# Patient Record
Sex: Female | Born: 1955 | Race: White | Hispanic: No | Marital: Married | Smoking: Former smoker
Health system: Southern US, Community
[De-identification: ages and names within clinical notes are randomized; demographics above are authoritative.]

## PROBLEM LIST (undated history)

## (undated) DIAGNOSIS — R202 Paresthesia of skin: Secondary | ICD-10-CM

## (undated) DIAGNOSIS — J189 Pneumonia, unspecified organism: Secondary | ICD-10-CM

## (undated) DIAGNOSIS — F32A Depression, unspecified: Secondary | ICD-10-CM

## (undated) DIAGNOSIS — F329 Major depressive disorder, single episode, unspecified: Secondary | ICD-10-CM

## (undated) DIAGNOSIS — Z8709 Personal history of other diseases of the respiratory system: Secondary | ICD-10-CM

## (undated) DIAGNOSIS — R011 Cardiac murmur, unspecified: Secondary | ICD-10-CM

## (undated) DIAGNOSIS — F419 Anxiety disorder, unspecified: Secondary | ICD-10-CM

## (undated) DIAGNOSIS — Z87442 Personal history of urinary calculi: Secondary | ICD-10-CM

## (undated) DIAGNOSIS — E785 Hyperlipidemia, unspecified: Secondary | ICD-10-CM

## (undated) DIAGNOSIS — M1990 Unspecified osteoarthritis, unspecified site: Secondary | ICD-10-CM

## (undated) DIAGNOSIS — R51 Headache: Secondary | ICD-10-CM

## (undated) DIAGNOSIS — R200 Anesthesia of skin: Secondary | ICD-10-CM

## (undated) DIAGNOSIS — IMO0002 Reserved for concepts with insufficient information to code with codable children: Secondary | ICD-10-CM

## (undated) DIAGNOSIS — I10 Essential (primary) hypertension: Secondary | ICD-10-CM

## (undated) DIAGNOSIS — G4730 Sleep apnea, unspecified: Secondary | ICD-10-CM

## (undated) DIAGNOSIS — K219 Gastro-esophageal reflux disease without esophagitis: Secondary | ICD-10-CM

## (undated) DIAGNOSIS — R42 Dizziness and giddiness: Secondary | ICD-10-CM

## (undated) HISTORY — PX: BACK SURGERY: SHX140

## (undated) HISTORY — PX: HAND SURGERY: SHX662

## (undated) HISTORY — PX: TOENAIL EXCISION: SHX183

## (undated) HISTORY — PX: COLONOSCOPY: SHX174

## (undated) HISTORY — DX: Morbid (severe) obesity due to excess calories: E66.01

## (undated) HISTORY — DX: Reserved for concepts with insufficient information to code with codable children: IMO0002

## (undated) HISTORY — PX: OTHER SURGICAL HISTORY: SHX169

## (undated) HISTORY — PX: REDUCTION MAMMAPLASTY: SUR839

## (undated) HISTORY — DX: Hyperlipidemia, unspecified: E78.5

---

## 1997-12-05 ENCOUNTER — Other Ambulatory Visit: Admission: RE | Admit: 1997-12-05 | Discharge: 1997-12-05 | Payer: Self-pay | Admitting: Obstetrics and Gynecology

## 1997-12-31 ENCOUNTER — Other Ambulatory Visit: Admission: RE | Admit: 1997-12-31 | Discharge: 1997-12-31 | Payer: Self-pay | Admitting: *Deleted

## 1998-02-04 ENCOUNTER — Other Ambulatory Visit: Admission: RE | Admit: 1998-02-04 | Discharge: 1998-02-04 | Payer: Self-pay | Admitting: *Deleted

## 1998-11-09 ENCOUNTER — Other Ambulatory Visit: Admission: RE | Admit: 1998-11-09 | Discharge: 1998-11-09 | Payer: Self-pay | Admitting: Obstetrics and Gynecology

## 1999-10-11 ENCOUNTER — Other Ambulatory Visit: Admission: RE | Admit: 1999-10-11 | Discharge: 1999-10-11 | Payer: Self-pay | Admitting: Obstetrics and Gynecology

## 1999-10-11 ENCOUNTER — Encounter (INDEPENDENT_AMBULATORY_CARE_PROVIDER_SITE_OTHER): Payer: Self-pay | Admitting: Specialist

## 2000-02-14 ENCOUNTER — Encounter: Admission: RE | Admit: 2000-02-14 | Discharge: 2000-05-14 | Payer: Self-pay | Admitting: Internal Medicine

## 2000-05-29 ENCOUNTER — Other Ambulatory Visit: Admission: RE | Admit: 2000-05-29 | Discharge: 2000-05-29 | Payer: Self-pay | Admitting: Obstetrics and Gynecology

## 2000-05-29 ENCOUNTER — Encounter: Payer: Self-pay | Admitting: Obstetrics and Gynecology

## 2000-05-29 ENCOUNTER — Encounter: Admission: RE | Admit: 2000-05-29 | Discharge: 2000-05-29 | Payer: Self-pay | Admitting: Obstetrics and Gynecology

## 2000-06-02 ENCOUNTER — Encounter: Payer: Self-pay | Admitting: Obstetrics and Gynecology

## 2000-06-05 ENCOUNTER — Encounter (INDEPENDENT_AMBULATORY_CARE_PROVIDER_SITE_OTHER): Payer: Self-pay

## 2000-06-06 ENCOUNTER — Inpatient Hospital Stay (HOSPITAL_COMMUNITY): Admission: EM | Admit: 2000-06-06 | Discharge: 2000-06-07 | Payer: Self-pay | Admitting: Obstetrics and Gynecology

## 2002-08-15 HISTORY — PX: ABDOMINAL HYSTERECTOMY: SHX81

## 2004-07-19 ENCOUNTER — Ambulatory Visit (HOSPITAL_COMMUNITY): Admission: RE | Admit: 2004-07-19 | Discharge: 2004-07-19 | Payer: Self-pay | Admitting: Internal Medicine

## 2005-01-18 ENCOUNTER — Encounter: Admission: RE | Admit: 2005-01-18 | Discharge: 2005-04-18 | Payer: Self-pay | Admitting: Internal Medicine

## 2005-11-10 ENCOUNTER — Ambulatory Visit: Payer: Self-pay

## 2007-05-21 ENCOUNTER — Ambulatory Visit (HOSPITAL_COMMUNITY): Admission: RE | Admit: 2007-05-21 | Discharge: 2007-05-21 | Payer: Self-pay | Admitting: Internal Medicine

## 2008-07-22 ENCOUNTER — Ambulatory Visit (HOSPITAL_COMMUNITY): Admission: RE | Admit: 2008-07-22 | Discharge: 2008-07-22 | Payer: Self-pay | Admitting: Internal Medicine

## 2008-10-08 ENCOUNTER — Encounter: Payer: Self-pay | Admitting: Pulmonary Disease

## 2008-12-02 ENCOUNTER — Ambulatory Visit (HOSPITAL_COMMUNITY): Admission: RE | Admit: 2008-12-02 | Discharge: 2008-12-02 | Payer: Self-pay | Admitting: Internal Medicine

## 2009-02-17 ENCOUNTER — Ambulatory Visit: Payer: Self-pay | Admitting: Pulmonary Disease

## 2009-02-17 DIAGNOSIS — D259 Leiomyoma of uterus, unspecified: Secondary | ICD-10-CM

## 2009-02-17 DIAGNOSIS — I10 Essential (primary) hypertension: Secondary | ICD-10-CM

## 2009-07-07 ENCOUNTER — Other Ambulatory Visit: Admission: RE | Admit: 2009-07-07 | Discharge: 2009-07-07 | Payer: Self-pay | Admitting: Internal Medicine

## 2009-10-16 ENCOUNTER — Ambulatory Visit (HOSPITAL_BASED_OUTPATIENT_CLINIC_OR_DEPARTMENT_OTHER): Admission: RE | Admit: 2009-10-16 | Discharge: 2009-10-16 | Payer: Self-pay | Admitting: Orthopedic Surgery

## 2009-11-18 ENCOUNTER — Encounter: Admission: RE | Admit: 2009-11-18 | Discharge: 2009-11-18 | Payer: Self-pay | Admitting: Orthopedic Surgery

## 2010-06-04 ENCOUNTER — Ambulatory Visit (HOSPITAL_COMMUNITY): Admission: RE | Admit: 2010-06-04 | Discharge: 2010-06-04 | Payer: Self-pay | Admitting: Internal Medicine

## 2010-07-23 ENCOUNTER — Encounter
Admission: RE | Admit: 2010-07-23 | Discharge: 2010-08-10 | Payer: Self-pay | Source: Home / Self Care | Attending: Orthopaedic Surgery | Admitting: Orthopaedic Surgery

## 2010-08-13 ENCOUNTER — Encounter
Admission: RE | Admit: 2010-08-13 | Discharge: 2010-08-13 | Payer: Self-pay | Source: Home / Self Care | Attending: Orthopaedic Surgery | Admitting: Orthopaedic Surgery

## 2010-11-08 LAB — GLUCOSE, CAPILLARY: Glucose-Capillary: 156 mg/dL — ABNORMAL HIGH (ref 70–99)

## 2010-11-08 LAB — BASIC METABOLIC PANEL
BUN: 8 mg/dL (ref 6–23)
Calcium: 9.6 mg/dL (ref 8.4–10.5)
Creatinine, Ser: 0.69 mg/dL (ref 0.4–1.2)
GFR calc Af Amer: >60 mL/min (ref >60)
GFR calc non Af Amer: >60 mL/min (ref >60)

## 2010-11-08 LAB — POCT HEMOGLOBIN-HEMACUE: Hemoglobin: 14.4 g/dL (ref 12.0–15.0)

## 2010-12-31 NOTE — H&P (Signed)
[facility]  Patient:    Sara Cross, Sara Cross                           MRN: [id number] Adm. Date:  06/05/00 Attending:  Nena Jordan A. Cherly Hensen, M.D.                         History and Physical  CHIEF COMPLAINT:  Severe dysmenorrhea, menometrorrhagia.  HISTORY OF PRESENT ILLNESS:  This is a 55 year old, gravida 3, para 1-1-1-2 female, status post a cesarean section x 2, tubal ligation, last menstrual period of May 20, 2000, who is now being admitted for laparoscopic-assisted vaginal hysterectomy, possible total abdominal hysterectomy secondary to severe dysmenorrhea unresponsive to nonsteroidals and menometrorrhagia.  The patient was initially seen in March of 1999 with a complaint of heavy menses with associated low-back pain.  At that time the patient was having ongoing heavy menses for about seven to eight months with associated nausea.  She had reported fifty cents size clots with the first three days of her cycle the heaviest and the forth day back pain started which this increased severity. She would change three super Tampax per hour, soaked.  Her cycles at that time was every 23 to 28 days.  The patient did not have intramenstrual or postcoital bleeding.  She had no history of thyroid disease or uterine fibroids.  The patient had underwent cesarean section x 2 secondary to toxicemia.  The patient underwent an ultrasound showing a retroverted uterus which measured 7.3 x 4.2 x 6.3 A with no obvious signs of fibroids, ovaries that were with a normal endometrial stripe.  She had a TSH done that was normal, prolactin that was also normal.  The patient tried several nonsteroidals without management of her symptoms.  She was placed on Nor-Q.D. given her history of hypertension, but had discontinued the medicine secondary to headache noted since the starting of the progestin tablets.  The patient was also given Darvocet-N 100 in the past without any relief of  her symptoms. Given the nonresponse to several hormonal and non-hormonal management to control this patients symptoms, she now presents for a hysterectomy.  ALLERGIES:  No known drug allergies.  MEDICATIONS: 1. Ziac 10 mg p.o. q.d. 2. Lasix 20 mg three times per day. 3. Potassium supplementation.  PAST MEDICAL HISTORY:  Chronic hypertension since age 89.  Recent diagnosis of adult onset diabetes.  PAST SURGICAL HISTORY:  Cesarean section x 2.  Tubal ligation.  OBSTETRICAL HISTORY:  Cesarean section x 2 secondary to toxicemia.  SAB x 1.  FAMILY HISTORY:  Diabetes, hypertension, no blood dyscrasia or clotting problems.  The patient denied any genital, colon or breast cancer.  REVIEW OF SYSTEMS:  Positive for postnasal drip.  Nausea associated with menses.  Hot flashes for several months.  Other systems are negative except for those noted in genitourinary system.  PHYSICAL EXAMINATION:  GENERAL:  A well-developed, well-nourished, obese white female in no acute distress.  VITAL SIGNS:  Blood pressure 136/80.  SKIN:  Shows no lesions.  HEENT:  Anicteric sclerae, pink conjunctivae.  Oropharynx negative.  HEART:  Regular rate and rhythm without murmur.  LUNGS:  Clear to auscultation.  BREASTS:  Pediculated, soft, nontender.  No palpable mass.  ABDOMEN:  Obese, soft, transverse scar.  No organomegaly.  PELVIC:  Vulva showed no lesions.  Vagina ______ blood in the vault.  Cervix Cervix:  No lesions.  Uterus:  Actually about seven weeks size and mobile. Adnexa:  Nontender.  No palpable mass but limited by body habitus.  LABORATORY DATA:  Pap results pending.  Mammogram negative.  Ultrasound October 08, 1999, uterus 8.0 x 4.9 x 5.7.  Ovaries had showed bilateral small follicles.  IMPRESSION:  Severe dysmenorrhagia, menorrhagia, probably secondary to adenomyosis, chronic hypertension, adult onset diabetes mellitus.  PLAN:  Admission.  LAVH.  Question TAH.  Antibiotics  prophylaxis.  DVT prophylaxis.  Risks and benefits of procedure was explained to patient including infection, bleeding which may require blood transfusion.  If blood transfusion, risks include acute reaction, HIV, risk of one out of 1:100,000, hepatitis risk of 1:300,000, inability to do vaginal hysterectomy, injury to the surround organ structures such as bowel, ureter, bladder, internal scar tissue which may already be present from the previous cesarean section, fistula formation, ovarian preservation with possibly need for surgery in future secondary to ovarian cyst or less likely ovarian cancer.  Continuation of her antihypertensive medicines.  No added salt diet.  Diabetes diet.  All questions answered. DD:  06/05/00 TD:  06/05/00 Job: [id number]

## 2010-12-31 NOTE — Op Note (Signed)
[facility]  Patient:    Sara Cross, Sara Cross                       MRN: [id number] Proc. Date: 06/05/00 Adm. Date:  [id number] Attending:  Maxie Better                           Operative Report  PREOPERATIVE DIAGNOSIS:  Severe dysmenorrhea and menorrhagia.  PROCEDURE:  Examination under anesthesia, laparoscopically assisted vaginal hysterectomy.  POSTOPERATIVE DIAGNOSIS:  Severe dysmenorrhea and menorrhagia.  ANESTHESIA:  General.  SURGEON:  Sheronette A. Cherly Hensen, M.D.  ASSISTANT:  Pershing Cox, M.D.  INDICATIONS:  This is a 55 year old gravida 2, para 2, female with a history of chronic hypertension, _____, cesarean section x 2, and tubal ligation, who has had severe dysmenorrhea and menorrhagia unresponsive to nonsteroidals and progesterone-only tablets, who now presents for definitive management of her symptoms.  The patient is a smoker.  Endometrial biopsy was benign.  Pap was normal.  Ultrasound did no reveal any findings of fibroids.  Risks and benefits of the procedure have been explained to the patient, consent was signed, and the patient was transferred to the operating room.  DESCRIPTION OF PROCEDURE:  Under adequate general anesthesia, the patient was placed in the dorsal lithotomy position using Allen stirrups.  Examination under anesthesia revealed an anteverted/axial uterus with no adnexal mass palpable, mobile.  The patient received antibiotic prophylaxis.   The abdomen, perineum, and vagina were sterilely prepped.  Indwelling Foley catheter was placed sterilely.  The patient was then sterilely draped in the usual fashion. A bivalve speculum had been placed in the vagina.  A tenaculum apparatus was placed for manipulation of the uterus.  Attention was then turned to the abdomen, where an infraumbilical transverse incision was then made and with sharp and blunt dissection, the rectus fascia was grasped, subsequently opened.   The parietal peritoneum was bluntly opened.  A Hasson disposable trocar with sleeve was introduced into the abdomen.  The rectus fascia had been tagged with 0 Vicryl sutures on either side.  The abdomen was insufflated without incident.  A lighted video laparoscope was then introduced into the abdomen through that port.  Panoramic inspection was then performed.  Normal liver edge was noted.  A second site was placed suprapubically along the patients prior Pfannenstiel scar and under direct visualization, a 5 mm port was introduced.  Through that second port, a probe was then used.  The bowels were placed displaced upwardly.  There was noted tenting of the bladder reflection secondary to her prior surgery.  There was evidence of prior surgical separation of both tubes, but they were otherwise normal.  Normal ovaries with the left showing evidence of impending ovulation.  The posterior cul-de-sac was free of any evidence of endometriosis, and there were prominent uterosacral ligaments.  Two additional ports were placed in the lower right and left quadrants under direct visualization.  Small incisions were made at those sites, and a 5 mm trocar with sleeve was introduced.  Using the 5 mm tripolar cautery and with the grasper, the left utero-ovarian ligaments were cauterized in three places, then cut.  The left round ligament was cauterized at several sites near each other in succession and cut.  The same procedure as performed on the contralateral side.  The ureter was looked for bilaterally and was not noted on either side but was clearly away from the  operative areas that had been cauterized.  Once the utero-ovarian ligament on the right was also severed, the round ligaments severed on the right as well, the bladder was taken down with sharp dissection using scissors.  At that point, the irrigator was then placed.  The bladder area was inspected, no evidence of active bleeding noted, and  decision was then made to turn to the vaginal portion of the case.  The Allen stirrups were then placed upwardly.  The instruments were removed from the vagina.  An anterior retractor was then placed as well, as well as a weighted speculum was placed posteriorly.   The cervix was grasped with the [facility] tenaculum, clamped x 2.  Circumferentially, dilute Pitressin was injected.  With sidewall retractors, retraction on the cervix, circumferential incision was made at the junction of the cervix and the vaginal area circumferentially.  Using Mayo scissors, the cervix and the vagina area were separated until the posterior cul-de-sac was identified, cut, and opened transversely.  The surrounding areas anteriorly were undermined and the bladder displaced upwardly.  The uterosacral ligaments were then clamped, cut, and suture ligated with 0 Vicryl bilaterally.  The anterior cul-de-sac was identified after dissection anteriorly and the bladder area opened.  The uterine vessels were then bilaterally clamped, cut, and suture ligated with 0 Vicryl suture.  The cardinal ligaments were then clamped, cut, and suture ligated with 0 Vicryl, and the uterus was then severed from its lateral attachments.  The uterus was then removed.  There was a small 1 cm free-floating mass, possibly a pedunculated fibroid, also noted and sent with the specimen.  Good hemostasis was noted.  The posterior cul-de-sac was then reefed with 0 Vicryl suture along the posterior area to reduce the risk of an enterocele.  This was accomplished by a running stitch from the right to the left uterosacral ligament and then tied in the midline.  The peritoneum otherwise was not closed.  Good hemostasis was continued to be noted.  The bowels were pushed upwardly, and the vagina was closed vertically using interrupted 0 Vicryl suture.  At that point, attention was then turned back to the abdomen.  The abdomen was reinsufflated.  Inspection  of the pelvis showed good hemostasis.  The area was irrigated, suctioned of debris.  The uterosacral ligaments were clearly tied in the midline.  The appendix was noted to be normal.  Under direct visualization, the lower ports were all  removed, the abdomen was deflated, and the infraumbilical port site was inspected.  The port was removed under direct visualization and the rectus fascia was again grasped and identified.  Using a finger, the area was inspected and palpated, and no underlying organ structure had come up within the incision.  The rectus fascia was grasped with two Kochers and closed with interrupted figure-of-eight 0 Vicryl suture.  A small bleeder was able to be cauterized in the subcutaneous area infraumbilically.  The incision was injected with 0.25% Marcaine, and the skin was approximated using 4-0 Vicryl suture.  The other smaller ports were closed with _____ glue.  SPECIMEN:  Uterus and cervix with a small 1 cm mass.  ESTIMATED BLOOD LOSS: 150 cc.  URINE OUTPUT:  About 200 cc clear, yellow urine.  INTRAOPERATIVE FLUID:  2100 cc crystalloid.  COUNTS:  Sponge and instrument counts x 2 were correct.  COMPLICATIONS:  None.  The patient tolerated the procedure well, was transferred to the recovery room in stable condition. DD:  06/05/00 TD:  06/06/00  Job: [id number]

## 2011-01-05 ENCOUNTER — Other Ambulatory Visit: Payer: Self-pay | Admitting: Neurosurgery

## 2011-01-05 DIAGNOSIS — M545 Low back pain: Secondary | ICD-10-CM

## 2011-01-06 ENCOUNTER — Other Ambulatory Visit: Payer: Self-pay | Admitting: Neurosurgery

## 2011-01-06 DIAGNOSIS — M545 Low back pain: Secondary | ICD-10-CM

## 2011-01-11 ENCOUNTER — Other Ambulatory Visit: Payer: Self-pay

## 2011-01-11 ENCOUNTER — Ambulatory Visit
Admission: RE | Admit: 2011-01-11 | Discharge: 2011-01-11 | Disposition: A | Discharge status: Home / Self Care | Payer: 59 | Source: Ambulatory Visit | Attending: Neurosurgery | Admitting: Neurosurgery

## 2011-01-11 ENCOUNTER — Other Ambulatory Visit: Payer: Self-pay | Admitting: Neurosurgery

## 2011-01-11 DIAGNOSIS — R52 Pain, unspecified: Secondary | ICD-10-CM

## 2011-01-11 DIAGNOSIS — M545 Low back pain: Secondary | ICD-10-CM

## 2011-01-26 ENCOUNTER — Ambulatory Visit (HOSPITAL_COMMUNITY)
Admission: RE | Admit: 2011-01-26 | Discharge: 2011-01-26 | Disposition: A | Discharge status: Home / Self Care | Payer: 59 | Source: Ambulatory Visit | Attending: Neurosurgery | Admitting: Neurosurgery

## 2011-01-26 ENCOUNTER — Encounter (HOSPITAL_COMMUNITY)
Admission: RE | Admit: 2011-01-26 | Discharge: 2011-01-26 | Disposition: A | Discharge status: Home / Self Care | Payer: 59 | Source: Ambulatory Visit | Attending: Neurosurgery | Admitting: Neurosurgery

## 2011-01-26 ENCOUNTER — Other Ambulatory Visit (HOSPITAL_COMMUNITY): Payer: Self-pay | Admitting: Neurosurgery

## 2011-01-26 DIAGNOSIS — M48061 Spinal stenosis, lumbar region without neurogenic claudication: Secondary | ICD-10-CM

## 2011-01-26 DIAGNOSIS — Z01812 Encounter for preprocedural laboratory examination: Secondary | ICD-10-CM | POA: Insufficient documentation

## 2011-01-26 DIAGNOSIS — M47817 Spondylosis without myelopathy or radiculopathy, lumbosacral region: Secondary | ICD-10-CM | POA: Insufficient documentation

## 2011-01-26 DIAGNOSIS — Z01818 Encounter for other preprocedural examination: Secondary | ICD-10-CM | POA: Insufficient documentation

## 2011-01-26 DIAGNOSIS — M47816 Spondylosis without myelopathy or radiculopathy, lumbar region: Secondary | ICD-10-CM

## 2011-01-26 LAB — CBC
MCH: 31.7 pg (ref 26.0–34.0)
MCHC: 34.5 g/dL (ref 30.0–36.0)
Platelets: 236 10*3/uL (ref 150–400)
RDW: 13.1 % (ref 11.5–15.5)

## 2011-01-26 LAB — BASIC METABOLIC PANEL
BUN: 14 mg/dL (ref 6–23)
Calcium: 10.3 mg/dL (ref 8.4–10.5)
Creatinine, Ser: 0.59 mg/dL (ref 0.4–1.2)
GFR calc non Af Amer: >60 mL/min (ref >60)
Glucose, Bld: 127 mg/dL — ABNORMAL HIGH (ref 70–99)
Potassium: 4.6 mEq/L (ref 3.5–5.1)

## 2011-01-26 LAB — SURGICAL PCR SCREEN: Staphylococcus aureus: NEGATIVE

## 2011-01-26 LAB — TYPE AND SCREEN: ABO/RH(D): O NEG

## 2011-01-31 ENCOUNTER — Inpatient Hospital Stay (HOSPITAL_COMMUNITY): Payer: 59

## 2011-01-31 ENCOUNTER — Inpatient Hospital Stay (HOSPITAL_COMMUNITY)
Admission: RE | Admit: 2011-01-31 | Discharge: 2011-02-04 | DRG: 460 | Disposition: A | Discharge status: Home / Self Care | Payer: 59 | Source: Ambulatory Visit | Attending: Neurosurgery | Admitting: Neurosurgery

## 2011-01-31 DIAGNOSIS — Z01810 Encounter for preprocedural cardiovascular examination: Secondary | ICD-10-CM

## 2011-01-31 DIAGNOSIS — M5126 Other intervertebral disc displacement, lumbar region: Secondary | ICD-10-CM | POA: Diagnosis present

## 2011-01-31 DIAGNOSIS — Z01812 Encounter for preprocedural laboratory examination: Secondary | ICD-10-CM

## 2011-01-31 DIAGNOSIS — Z01818 Encounter for other preprocedural examination: Secondary | ICD-10-CM

## 2011-01-31 DIAGNOSIS — M47817 Spondylosis without myelopathy or radiculopathy, lumbosacral region: Principal | ICD-10-CM | POA: Diagnosis present

## 2011-02-02 NOTE — Op Note (Signed)
Sara Cross, Sara Cross NO.:  [id number]  MEDICAL RECORD NO.:  [id number]  LOCATION:  3038                         FACILITY:  [facility]  PHYSICIAN:  Donalee Citrin, M.D.        DATE OF BIRTH:  [birth year]  DATE OF PROCEDURE:  01/31/2011 DATE OF DISCHARGE:                              OPERATIVE REPORT   PREOPERATIVE DIAGNOSIS:  Lumbar spinal stenosis multifactorial from spondylosis and large ruptured disk at L2-3 and L3-4.  PROCEDURE:  Decompressive lumbar laminectomies and excessive need with a standard interbody fusion, posterior lumbar interbody fusion at L2-3 and L3-4 using Telamon PEEK cage packed with autograft and Tangent allograft wedge, pedicle screw fixation using the Globus Revere pedicle screw system L2-L4, posterolateral arthrodesis L2-L4 using autograft harvested locally.  SURGEON:  Donalee Citrin, MD.  ANESTHESIA.:  General endotracheal.  HISTORY OF PRESENT ILLNESS:  The patient is a very pleasant 55 year old female who has had progressed worsening back, bilateral leg pain radiating down prominently to anterior quads, but occasionally around the knee the front of her shin consistent both in L3-L4 nerve root pattern.  MRI scan showed severe lumbar spinal stenosis, prominently at L2-L3, but also L3-L4, large disk herniation at L2-L3, severe facet arthropathy of the L2-3 and L3-4.  The patient was recommended decompressive and stabilization procedure after failed conservative treatment, MRI findings and clinical exam, so the risks and benefits of the operation were explained to the patient.  She understood and agreed to proceed forth.  DESCRIPTION OF PROCEDURE:  The patient was brought to the OR and induced under anesthesia, positioned prone on the Wilson frame.  Back was prepped and draped in the routine sterile fashion.  The fascia was localized at the appropriate level.  After infiltration of 10 mL of lidocaine with epi, a midline incision was made.  Bovie  electrocautery was used to take down the subcutaneous and subperiosteal and dissection was carried down the lamina of L2, L3 and L4 bilaterally exposing the T- piece at L2, L3 and L4 bilaterally.  Intraoperative x-ray identified the appropriate level.  The spinous processes at L2 and L3 were removed. Central decompression was begun and marked facet arthropathy was causing severe hourglass compression of thecal sac at both L2-3 and L3-4.  This was all teased away with a Penfield and removed with Kerrison and rongeurs.  Complete medial facetectomies were performed at L2-3 and L3-4 to gain access to lateral margin disk space.  This allowed aggressive unroofing of the foramina at L2, L3 and L4, and all epidural veins coagulated at the disk space and after adequate decompression had been achieved, attention was taken for pedicle placement using a high-speed drill, pilot holes were drilled at L2 in the right cannula with the awl probed, tapped with a 5.5 tap, probed again and 6 x 45 screw inserted at L2 on the right.  In a similar fashion 6 x 40 screws were inserted at L3and L4 on the right as well as L2, L3 and L4 on the left.  After all screws had been placed, attention taken to the interbody work using a D'Errico.  The L3 nerve root was reflected medially and  disk space was incised and cleaned out.  Sequential dilatation was carried out with a size 8, 9, and 10 distractor, 10 was felt to be the appropriate sizing of the graft at L2-3.  Then, the distractor was also inserted to L4-5 and this was sized up to an 8, so with an 10 distractor in L2-3 the opposite side was cleaned out.  Endplates were prepped.  A  Telamon PEEK cage packed with autograft was then inserted on the patient's left side. Then, at L2-3, then at L3-4 in a similar fashion endplates were prepped using a size 8 cutter and chisel, and then adequate central decompression and diskectomy was performed scraping the  central endplates and a Tangent allograft wedge was inserted in left L4-5, then working on the right, large disk herniation was immediately identified and removed from the diskectomy at L2-3.  Several large fiber cyst were removed from the central compartment of both L2-3 and L3-4.  Local autograft was then packed centrally after adequate end-plate preparation with size 10 cutter and chisel and size 8 cutter and chisel respectively at L2-3 and L3-4, and then a Tangent allograft wedge was inserted on the right L2-3 and a Telamon on the right at L3-4.  After all the interbody work had been done, screws had been placed.  Copious irrigation was achieved, aggressive decortication was carried on T-piece and lateral gutters.  The remainder of local autograft was packed posterolaterally. A 75-mm rod was placed on the left and 80-mm rod on the right.  Top tightening nuts were placed at L5-L4.  The L3 screw was compressed against L4.  The L2 compressed against L3.  Cross-link was then applied. Large Hemovac drain was placed.  Postop fluoroscopy confirmed good position of the screws, rods and implant.  Then, the wound was closed sequentially in layers with interrupted Vicryl and skin was closed with running 4-0 subcuticular.  Benzoin and Steri-Strips were applied.  The patient went to recovery room in stable condition.  At the end of the case, sponge and instrument counts were correct.          ______________________________ Donalee Citrin, M.D.     GC/MEDQ  D:  01/31/2011  T:  02/01/2011  Job:  [id number]  Electronically Signed by Donalee Citrin M.D. on 02/02/2011 08:50:50 AM

## 2011-02-03 ENCOUNTER — Inpatient Hospital Stay (HOSPITAL_COMMUNITY): Payer: 59

## 2011-03-03 NOTE — Discharge Summary (Signed)
  Sara Cross, Sara Cross                  ACCOUNT NO.:  [id number]  MEDICAL RECORD NO.:  [id number]  LOCATION:  3038                         FACILITY:  [facility]  PHYSICIAN:  Donalee Citrin, M.D.        DATE OF BIRTH:  29-May-1956  DATE OF ADMISSION:  01/31/2011 DATE OF DISCHARGE:  02/04/2011                              DISCHARGE SUMMARY   ADMISSION DIAGNOSES:  Degenerative joint disease and lumbar spinal stenosis, L2-3 and L3-4.  DISCHARGE DIAGNOSES:  Degenerative joint disease and lumbar spinal stenosis, L2-3 and L3-4.  PROCEDURES DURING HOSPITALIZATION:  Decompressive laminectomies at L2-3 and L3-4 with posterior lumbar interbody spinal fusion, L2-3 and L3-4.  HOSPITAL COURSE:  The patient was admitted as an AMA, went to the operating room, and underwent the aforementioned procedure. Postoperatively, the patient did very well, went to the recovery room and the floor.  On the floor, the patient was convalescing well with significant improvement of her legs.  Was ambulating and voiding with physical therapy.  By hospital day #2, drain was taken out.  She was continued to work with therapy.  She did have a little bit of pain that creeped up but was well controlled.  By postop day #4, the patient was stable to discharge home with scheduled followup in approximately 2 weeks on p.o. pain medications and muscle relaxers.  At the time of discharge, the patient was doing very well.          ______________________________ Donalee Citrin, M.D.     GC/MEDQ  D:  02/10/2011  T:  02/11/2011  Job:  [id number]  Electronically Signed by Donalee Citrin M.D. on 03/03/2011 03:43:25 PM

## 2011-03-31 ENCOUNTER — Other Ambulatory Visit (HOSPITAL_COMMUNITY): Payer: Self-pay | Admitting: Neurosurgery

## 2011-03-31 ENCOUNTER — Ambulatory Visit
Admission: RE | Admit: 2011-03-31 | Discharge: 2011-03-31 | Disposition: A | Discharge status: Home / Self Care | Payer: 59 | Source: Ambulatory Visit | Attending: Neurosurgery | Admitting: Neurosurgery

## 2011-03-31 ENCOUNTER — Other Ambulatory Visit: Payer: Self-pay | Admitting: Neurosurgery

## 2011-03-31 DIAGNOSIS — R200 Anesthesia of skin: Secondary | ICD-10-CM

## 2011-03-31 DIAGNOSIS — M47817 Spondylosis without myelopathy or radiculopathy, lumbosacral region: Secondary | ICD-10-CM

## 2011-03-31 DIAGNOSIS — M545 Low back pain: Secondary | ICD-10-CM

## 2011-03-31 DIAGNOSIS — M5137 Other intervertebral disc degeneration, lumbosacral region: Secondary | ICD-10-CM

## 2011-03-31 DIAGNOSIS — M542 Cervicalgia: Secondary | ICD-10-CM

## 2011-04-06 ENCOUNTER — Ambulatory Visit (HOSPITAL_COMMUNITY)
Admission: RE | Admit: 2011-04-06 | Discharge: 2011-04-06 | Disposition: A | Discharge status: Home / Self Care | Payer: 59 | Source: Ambulatory Visit | Attending: Neurosurgery | Admitting: Neurosurgery

## 2011-04-06 DIAGNOSIS — M79609 Pain in unspecified limb: Secondary | ICD-10-CM | POA: Insufficient documentation

## 2011-04-06 DIAGNOSIS — R9430 Abnormal result of cardiovascular function study, unspecified: Secondary | ICD-10-CM

## 2011-04-06 DIAGNOSIS — R200 Anesthesia of skin: Secondary | ICD-10-CM

## 2011-04-06 DIAGNOSIS — R209 Unspecified disturbances of skin sensation: Secondary | ICD-10-CM | POA: Insufficient documentation

## 2011-04-06 DIAGNOSIS — M5020 Other cervical disc displacement, unspecified cervical region: Secondary | ICD-10-CM | POA: Insufficient documentation

## 2011-04-06 DIAGNOSIS — M4802 Spinal stenosis, cervical region: Secondary | ICD-10-CM | POA: Insufficient documentation

## 2011-04-06 DIAGNOSIS — M542 Cervicalgia: Secondary | ICD-10-CM | POA: Insufficient documentation

## 2011-04-06 DIAGNOSIS — M5090 Cervical disc disorder, unspecified, unspecified cervical region: Secondary | ICD-10-CM | POA: Insufficient documentation

## 2011-04-27 ENCOUNTER — Other Ambulatory Visit: Payer: Self-pay | Admitting: Neurosurgery

## 2011-04-27 DIAGNOSIS — I739 Peripheral vascular disease, unspecified: Secondary | ICD-10-CM

## 2011-04-29 ENCOUNTER — Ambulatory Visit
Admission: RE | Admit: 2011-04-29 | Discharge: 2011-04-29 | Disposition: A | Discharge status: Home / Self Care | Payer: 59 | Source: Ambulatory Visit | Attending: Neurosurgery | Admitting: Neurosurgery

## 2011-04-29 DIAGNOSIS — I739 Peripheral vascular disease, unspecified: Secondary | ICD-10-CM

## 2011-04-29 DIAGNOSIS — R200 Anesthesia of skin: Secondary | ICD-10-CM

## 2011-04-29 MED ORDER — IOHEXOL 350 MG/ML SOLN
150.0000 mL | Freq: Once | INTRAVENOUS | Status: AC | PRN
  Administered 2011-04-29: 150 mL via INTRAVENOUS

## 2011-08-04 ENCOUNTER — Other Ambulatory Visit (HOSPITAL_COMMUNITY): Payer: Self-pay | Admitting: Family Medicine

## 2011-08-04 DIAGNOSIS — Z1231 Encounter for screening mammogram for malignant neoplasm of breast: Secondary | ICD-10-CM

## 2011-09-08 ENCOUNTER — Ambulatory Visit (HOSPITAL_COMMUNITY)
Admission: RE | Admit: 2011-09-08 | Discharge: 2011-09-08 | Disposition: A | Discharge status: Home / Self Care | Payer: 59 | Source: Ambulatory Visit | Attending: Family Medicine | Admitting: Family Medicine

## 2011-09-08 DIAGNOSIS — Z1231 Encounter for screening mammogram for malignant neoplasm of breast: Secondary | ICD-10-CM | POA: Insufficient documentation

## 2011-10-28 ENCOUNTER — Other Ambulatory Visit (HOSPITAL_COMMUNITY): Payer: Self-pay | Admitting: *Deleted

## 2011-10-31 ENCOUNTER — Encounter (HOSPITAL_COMMUNITY)
Admission: RE | Admit: 2011-10-31 | Discharge: 2011-10-31 | Disposition: A | Discharge status: Home / Self Care | Payer: BC Managed Care – PPO | Source: Ambulatory Visit | Attending: Neurosurgery | Admitting: Neurosurgery

## 2011-10-31 ENCOUNTER — Encounter (HOSPITAL_COMMUNITY): Payer: Self-pay

## 2011-10-31 DIAGNOSIS — Z01812 Encounter for preprocedural laboratory examination: Secondary | ICD-10-CM | POA: Insufficient documentation

## 2011-10-31 HISTORY — DX: Sleep apnea, unspecified: G47.30

## 2011-10-31 HISTORY — DX: Essential (primary) hypertension: I10

## 2011-10-31 HISTORY — DX: Depression, unspecified: F32.A

## 2011-10-31 HISTORY — DX: Major depressive disorder, single episode, unspecified: F32.9

## 2011-10-31 HISTORY — DX: Anxiety disorder, unspecified: F41.9

## 2011-10-31 LAB — SURGICAL PCR SCREEN: Staphylococcus aureus: NEGATIVE

## 2011-10-31 LAB — BASIC METABOLIC PANEL
CO2: 28 mEq/L (ref 19–32)
Calcium: 10.6 mg/dL — ABNORMAL HIGH (ref 8.4–10.5)
GFR calc non Af Amer: >90 mL/min (ref >90)
Glucose, Bld: 158 mg/dL — ABNORMAL HIGH (ref 70–99)
Potassium: 4.9 mEq/L (ref 3.5–5.1)
Sodium: 136 mEq/L (ref 135–145)

## 2011-10-31 LAB — CBC
HCT: 40.3 % (ref 36.0–46.0)
Hemoglobin: 13.8 g/dL (ref 12.0–15.0)
MCH: 30.4 pg (ref 26.0–34.0)
MCHC: 34.2 g/dL (ref 30.0–36.0)
MCV: 88.8 fL (ref 78.0–100.0)

## 2011-10-31 NOTE — Pre-Procedure Instructions (Addendum)
20 Sara Cross  10/31/2011   Your procedure is scheduled on:  11/04/11 Friday    Report to [facility] Short Stay Center at 1100 AM.  Call this number if you have problems the morning of surgery: [phone]   Remember:   Do not eat food:After Midnight.  May have clear liquids: up to 4 Hours before arrival.  Clear liquids include soda, tea, black coffee, apple or grape juice, broth.  Take these medicines the morning of surgery with A SIP OF WATER: TYLENOL, WELLBUTRIN     Do not wear jewelry, make-up or nail polish.  Do not wear lotions, powders, or perfumes. You may wear deodorant.  Do not shave 48 hours prior to surgery.  Do not bring valuables to the hospital.  Contacts, dentures or bridgework may not be worn into surgery.  Leave suitcase in the car. After surgery it may be brought to your room.  For patients admitted to the hospital, checkout time is 11:00 AM the day of discharge.   Patients discharged the day of surgery will not be allowed to drive home.  Name and phone number of your driver:   Special Instructions: CHG Shower Use Special Wash: 1/2 bottle night before surgery and 1/2 bottle morning of surgery.   Please read over the following fact sheets that you were given: Pain Booklet, MRSA Information and Surgical Site Infection Prevention

## 2011-11-03 MED ORDER — DEXAMETHASONE SODIUM PHOSPHATE 10 MG/ML IJ SOLN: 10.0000 mg | Freq: Once | INTRAMUSCULAR | Status: DC

## 2011-11-03 MED ORDER — CEFAZOLIN SODIUM-DEXTROSE 2-3 GM-% IV SOLR
2.0000 g | INTRAVENOUS | Status: DC
  Filled 2011-11-03: qty 50

## 2011-11-04 ENCOUNTER — Encounter (HOSPITAL_COMMUNITY): Admission: RE | Payer: Self-pay | Source: Ambulatory Visit

## 2011-11-04 ENCOUNTER — Ambulatory Visit (HOSPITAL_COMMUNITY): Admission: RE | Admit: 2011-11-04 | Payer: BC Managed Care – PPO | Source: Ambulatory Visit | Admitting: Neurosurgery

## 2011-11-04 SURGERY — ANTERIOR CERVICAL DECOMPRESSION/DISCECTOMY FUSION 2 LEVELS
Anesthesia: General

## 2011-12-06 ENCOUNTER — Telehealth: Payer: Self-pay | Admitting: Internal Medicine

## 2011-12-06 NOTE — Telephone Encounter (Signed)
Patient has called [facility] requesting a switch to our Dr.Tabori. Please advise if that is ok and I can call her back to schedule an appointment Thank You  Judeth Cornfield McDaniels [facility] Scheduler

## 2011-12-07 ENCOUNTER — Other Ambulatory Visit: Payer: Self-pay | Admitting: Neurosurgery

## 2011-12-08 ENCOUNTER — Encounter (HOSPITAL_COMMUNITY): Payer: Self-pay | Admitting: Pharmacy Technician

## 2011-12-14 NOTE — Pre-Procedure Instructions (Signed)
20 TEMILOLUWA RECCHIA  12/14/2011   Your procedure is scheduled on:  Dec 23, 2011 @ 1152  Report to [facility] at [phone] AM.  Call this number if you have problems the morning of surgery: [phone]   Remember:   Do not eat food:After Midnight.  May have clear liquids: up to 4 Hours before arrival.  Clear liquids include soda, tea, black coffee, apple or grape juice, broth.  Take these medicines the morning of surgery with A SIP OF WATER: Wellbutrin    Do not wear jewelry, make-up or nail polish.  Do not wear lotions, powders, or perfumes.   Do not shave 48 hours prior to surgery.  Do not bring valuables to the hospital.  Contacts, dentures or bridgework may not be worn into surgery.  Leave suitcase in the car. After surgery it may be brought to your room.  For patients admitted to the hospital, checkout time is 11:00 AM the day of discharge.   Patients discharged the day of surgery will not be allowed to drive home.  Special Instructions: CHG Shower Use Special Wash: 1/2 bottle night before surgery and 1/2 bottle morning of surgery.   Please read over the following fact sheets that you were given: Pain Booklet, Coughing and Deep Breathing, MRSA Information and Surgical Site Infection Prevention

## 2011-12-15 ENCOUNTER — Encounter (HOSPITAL_COMMUNITY)
Admission: RE | Admit: 2011-12-15 | Discharge: 2011-12-15 | Disposition: A | Discharge status: Home / Self Care | Payer: BC Managed Care – PPO | Source: Ambulatory Visit | Attending: Neurosurgery | Admitting: Neurosurgery

## 2011-12-15 ENCOUNTER — Encounter (HOSPITAL_COMMUNITY): Payer: Self-pay

## 2011-12-15 HISTORY — DX: Pneumonia, unspecified organism: J18.9

## 2011-12-15 HISTORY — DX: Unspecified osteoarthritis, unspecified site: M19.90

## 2011-12-15 LAB — BASIC METABOLIC PANEL
CO2: 25 mEq/L (ref 19–32)
Chloride: 99 mEq/L (ref 96–112)
Glucose, Bld: 147 mg/dL — ABNORMAL HIGH (ref 70–99)
Sodium: 134 mEq/L — ABNORMAL LOW (ref 135–145)

## 2011-12-15 LAB — SURGICAL PCR SCREEN
MRSA, PCR: NEGATIVE
Staphylococcus aureus: NEGATIVE

## 2011-12-15 LAB — CBC
Hemoglobin: 13 g/dL (ref 12.0–15.0)
Platelets: 251 10*3/uL (ref 150–400)
RBC: 4.16 MIL/uL (ref 3.87–5.11)
WBC: 10.6 10*3/uL — ABNORMAL HIGH (ref 4.0–10.5)

## 2011-12-15 NOTE — Progress Notes (Signed)
Primary physician - Dr. Tyron Russell - Sara Cross Does not have cardiologist.  EKG Jan 2013- Dr. Alberteen Sam - request records Echo and Stress test > 10 years ago.

## 2011-12-22 MED ORDER — CEFAZOLIN SODIUM-DEXTROSE 2-3 GM-% IV SOLR
2.0000 g | INTRAVENOUS | Status: AC
  Administered 2011-12-23: 2 g via INTRAVENOUS
  Filled 2011-12-22: qty 50

## 2011-12-23 ENCOUNTER — Encounter (HOSPITAL_COMMUNITY): Payer: Self-pay | Admitting: *Deleted

## 2011-12-23 ENCOUNTER — Encounter (HOSPITAL_COMMUNITY): Payer: Self-pay | Admitting: Anesthesiology

## 2011-12-23 ENCOUNTER — Encounter (HOSPITAL_COMMUNITY): Payer: Self-pay | Admitting: Certified Registered Nurse Anesthetist

## 2011-12-23 ENCOUNTER — Ambulatory Visit (HOSPITAL_COMMUNITY): Payer: BC Managed Care – PPO

## 2011-12-23 ENCOUNTER — Ambulatory Visit (HOSPITAL_COMMUNITY)
Admission: RE | Admit: 2011-12-23 | Discharge: 2011-12-24 | Disposition: A | Discharge status: Home / Self Care | Payer: BC Managed Care – PPO | Source: Ambulatory Visit | Attending: Neurosurgery | Admitting: Neurosurgery

## 2011-12-23 ENCOUNTER — Ambulatory Visit (HOSPITAL_COMMUNITY): Payer: BC Managed Care – PPO | Admitting: Anesthesiology

## 2011-12-23 ENCOUNTER — Encounter (HOSPITAL_COMMUNITY)
Admission: RE | Disposition: A | Discharge status: Home / Self Care | Payer: Self-pay | Source: Ambulatory Visit | Attending: Neurosurgery

## 2011-12-23 DIAGNOSIS — Z01812 Encounter for preprocedural laboratory examination: Secondary | ICD-10-CM | POA: Insufficient documentation

## 2011-12-23 DIAGNOSIS — M47817 Spondylosis without myelopathy or radiculopathy, lumbosacral region: Secondary | ICD-10-CM | POA: Insufficient documentation

## 2011-12-23 DIAGNOSIS — G5600 Carpal tunnel syndrome, unspecified upper limb: Secondary | ICD-10-CM | POA: Insufficient documentation

## 2011-12-23 HISTORY — PX: ANTERIOR CERVICAL DECOMP/DISCECTOMY FUSION: SHX1161

## 2011-12-23 HISTORY — PX: CARPAL TUNNEL RELEASE: SHX101

## 2011-12-23 LAB — GLUCOSE, CAPILLARY: Glucose-Capillary: 153 mg/dL — ABNORMAL HIGH (ref 70–99)

## 2011-12-23 SURGERY — ANTERIOR CERVICAL DECOMPRESSION/DISCECTOMY FUSION 2 LEVELS
Anesthesia: General | Site: Neck | Wound class: Clean

## 2011-12-23 MED ORDER — VECURONIUM BROMIDE 10 MG IV SOLR
INTRAVENOUS | Status: DC | PRN
  Administered 2011-12-23 (×3): 1 mg via INTRAVENOUS

## 2011-12-23 MED ORDER — LACTATED RINGERS IV SOLN
INTRAVENOUS | Status: DC | PRN
  Administered 2011-12-23 (×2): via INTRAVENOUS

## 2011-12-23 MED ORDER — METFORMIN HCL ER 500 MG PO TB24
1000.0000 mg | ORAL_TABLET | Freq: Every day | ORAL | Status: DC
  Administered 2011-12-23: 1000 mg via ORAL
  Filled 2011-12-23 (×2): qty 2

## 2011-12-23 MED ORDER — CYCLOBENZAPRINE HCL 10 MG PO TABS
10.0000 mg | ORAL_TABLET | Freq: Three times a day (TID) | ORAL | Status: DC | PRN
  Administered 2011-12-23 – 2011-12-24 (×3): 10 mg via ORAL
  Filled 2011-12-23 (×2): qty 1

## 2011-12-23 MED ORDER — ACETAMINOPHEN 650 MG RE SUPP: 650.0000 mg | RECTAL | Status: DC | PRN

## 2011-12-23 MED ORDER — ONDANSETRON HCL 4 MG/2ML IJ SOLN: 4.0000 mg | Freq: Once | INTRAMUSCULAR | Status: DC | PRN

## 2011-12-23 MED ORDER — BACITRACIN 50000 UNITS IM SOLR
INTRAMUSCULAR | Status: AC
  Filled 2011-12-23: qty 1

## 2011-12-23 MED ORDER — THROMBIN 5000 UNITS EX SOLR
OROMUCOSAL | Status: DC | PRN
  Administered 2011-12-23: 12:00:00 via TOPICAL

## 2011-12-23 MED ORDER — NEOSTIGMINE METHYLSULFATE 1 MG/ML IJ SOLN
INTRAMUSCULAR | Status: DC | PRN
  Administered 2011-12-23: 5 mg via INTRAVENOUS

## 2011-12-23 MED ORDER — VITAMIN D (ERGOCALCIFEROL) 1.25 MG (50000 UNIT) PO CAPS
50000.0000 [IU] | ORAL_CAPSULE | ORAL | Status: DC
  Administered 2011-12-23: 50000 [IU] via ORAL
  Filled 2011-12-23: qty 1

## 2011-12-23 MED ORDER — HYDROCODONE-ACETAMINOPHEN 10-325 MG PO TABS: 1.0000 | ORAL_TABLET | ORAL | Status: DC | PRN

## 2011-12-23 MED ORDER — DEXAMETHASONE SODIUM PHOSPHATE 10 MG/ML IJ SOLN
INTRAMUSCULAR | Status: AC
  Administered 2011-12-23: 10 mg via INTRAVENOUS
  Filled 2011-12-23: qty 1

## 2011-12-23 MED ORDER — SODIUM CHLORIDE 0.9 % IV SOLN: 0.5000 mg/h | Freq: Once | INTRAVENOUS | Status: DC

## 2011-12-23 MED ORDER — HYDROMORPHONE HCL PF 1 MG/ML IJ SOLN: 0.5000 mg | Freq: Once | INTRAMUSCULAR | Status: DC

## 2011-12-23 MED ORDER — LISINOPRIL 20 MG PO TABS
20.0000 mg | ORAL_TABLET | Freq: Every day | ORAL | Status: DC
  Administered 2011-12-23 – 2011-12-24 (×2): 20 mg via ORAL
  Filled 2011-12-23 (×2): qty 1

## 2011-12-23 MED ORDER — PHENYLEPHRINE HCL 10 MG/ML IJ SOLN
INTRAMUSCULAR | Status: DC | PRN
  Administered 2011-12-23 (×5): 40 ug via INTRAVENOUS

## 2011-12-23 MED ORDER — FENTANYL CITRATE 0.05 MG/ML IJ SOLN
INTRAMUSCULAR | Status: DC | PRN
  Administered 2011-12-23: 50 ug via INTRAVENOUS
  Administered 2011-12-23: 150 ug via INTRAVENOUS
  Administered 2011-12-23: 50 ug via INTRAVENOUS

## 2011-12-23 MED ORDER — SODIUM CHLORIDE 0.9 % IR SOLN
Status: DC | PRN
  Administered 2011-12-23: 11:00:00

## 2011-12-23 MED ORDER — HYDROMORPHONE HCL PF 1 MG/ML IJ SOLN
INTRAMUSCULAR | Status: AC
  Filled 2011-12-23: qty 1

## 2011-12-23 MED ORDER — SODIUM CHLORIDE 0.9 % IV SOLN
INTRAVENOUS | Status: AC
  Filled 2011-12-23: qty 500

## 2011-12-23 MED ORDER — GLYCOPYRROLATE 0.2 MG/ML IJ SOLN
INTRAMUSCULAR | Status: DC | PRN
  Administered 2011-12-23: .8 mg via INTRAVENOUS

## 2011-12-23 MED ORDER — THROMBIN 5000 UNITS EX SOLR
CUTANEOUS | Status: DC | PRN
  Administered 2011-12-23 (×2): 5000 [IU] via TOPICAL

## 2011-12-23 MED ORDER — BUPROPION HCL ER (XL) 300 MG PO TB24
300.0000 mg | ORAL_TABLET | Freq: Every day | ORAL | Status: DC
  Administered 2011-12-23 – 2011-12-24 (×2): 300 mg via ORAL
  Filled 2011-12-23 (×2): qty 1

## 2011-12-23 MED ORDER — OXYCODONE-ACETAMINOPHEN 5-325 MG PO TABS
1.0000 | ORAL_TABLET | ORAL | Status: DC | PRN
  Administered 2011-12-23 – 2011-12-24 (×3): 2 via ORAL
  Filled 2011-12-23 (×3): qty 2

## 2011-12-23 MED ORDER — CEFAZOLIN SODIUM 1-5 GM-% IV SOLN
1.0000 g | Freq: Three times a day (TID) | INTRAVENOUS | Status: AC
  Administered 2011-12-23 – 2011-12-24 (×2): 1 g via INTRAVENOUS
  Filled 2011-12-23 (×2): qty 50

## 2011-12-23 MED ORDER — PROPOFOL 10 MG/ML IV EMUL
INTRAVENOUS | Status: DC | PRN
  Administered 2011-12-23: 200 mg via INTRAVENOUS

## 2011-12-23 MED ORDER — ROCURONIUM BROMIDE 100 MG/10ML IV SOLN
INTRAVENOUS | Status: DC | PRN
  Administered 2011-12-23: 50 mg via INTRAVENOUS

## 2011-12-23 MED ORDER — ONDANSETRON HCL 4 MG/2ML IJ SOLN: 4.0000 mg | INTRAMUSCULAR | Status: DC | PRN

## 2011-12-23 MED ORDER — HEMOSTATIC AGENTS (NO CHARGE) OPTIME
TOPICAL | Status: DC | PRN
  Administered 2011-12-23: 1 via TOPICAL

## 2011-12-23 MED ORDER — LIDOCAINE-EPINEPHRINE 1 %-1:100000 IJ SOLN
INTRAMUSCULAR | Status: DC | PRN
  Administered 2011-12-23: 8 mL

## 2011-12-23 MED ORDER — 0.9 % SODIUM CHLORIDE (POUR BTL) OPTIME
TOPICAL | Status: DC | PRN
  Administered 2011-12-23: 1000 mL

## 2011-12-23 MED ORDER — HYDROMORPHONE HCL PF 1 MG/ML IJ SOLN
0.2500 mg | INTRAMUSCULAR | Status: DC | PRN
  Administered 2011-12-23 (×6): 0.5 mg via INTRAVENOUS

## 2011-12-23 MED ORDER — ACETAMINOPHEN 325 MG PO TABS: 650.0000 mg | ORAL_TABLET | ORAL | Status: DC | PRN

## 2011-12-23 MED ORDER — TRIAMTERENE-HCTZ 37.5-25 MG PO TABS
2.0000 | ORAL_TABLET | Freq: Every day | ORAL | Status: DC
  Administered 2011-12-24: 2 via ORAL
  Filled 2011-12-23 (×2): qty 2

## 2011-12-23 MED ORDER — ONDANSETRON HCL 4 MG/2ML IJ SOLN
INTRAMUSCULAR | Status: DC | PRN
  Administered 2011-12-23: 4 mg via INTRAVENOUS

## 2011-12-23 MED ORDER — LIDOCAINE HCL (CARDIAC) 20 MG/ML IV SOLN
INTRAVENOUS | Status: DC | PRN
  Administered 2011-12-23: 60 mg via INTRAVENOUS

## 2011-12-23 MED ORDER — SODIUM CHLORIDE 0.9 % IJ SOLN
3.0000 mL | Freq: Two times a day (BID) | INTRAMUSCULAR | Status: DC
  Administered 2011-12-23: 3 mL via INTRAVENOUS

## 2011-12-23 MED ORDER — ASPIRIN EC 325 MG PO TBEC
325.0000 mg | DELAYED_RELEASE_TABLET | Freq: Every day | ORAL | Status: DC
Start: 2011-12-23 — End: 2011-12-24
  Administered 2011-12-23 – 2011-12-24 (×2): 325 mg via ORAL
  Filled 2011-12-23 (×3): qty 1

## 2011-12-23 MED ORDER — MENTHOL 3 MG MT LOZG: 1.0000 | LOZENGE | OROMUCOSAL | Status: DC | PRN

## 2011-12-23 MED ORDER — DEXAMETHASONE SODIUM PHOSPHATE 10 MG/ML IJ SOLN: 10.0000 mg | INTRAMUSCULAR | Status: DC

## 2011-12-23 MED ORDER — HYDROMORPHONE HCL PF 1 MG/ML IJ SOLN
0.5000 mg | INTRAMUSCULAR | Status: DC | PRN
  Administered 2011-12-23 – 2011-12-24 (×3): 1 mg via INTRAVENOUS
  Filled 2011-12-23 (×3): qty 1

## 2011-12-23 MED ORDER — CYCLOBENZAPRINE HCL 10 MG PO TABS
ORAL_TABLET | ORAL | Status: AC
  Filled 2011-12-23: qty 1

## 2011-12-23 MED ORDER — PHENOL 1.4 % MT LIQD
1.0000 | OROMUCOSAL | Status: DC | PRN
  Administered 2011-12-23: 1 via OROMUCOSAL
  Filled 2011-12-23: qty 177

## 2011-12-23 SURGICAL SUPPLY — 99 items
ADH SKN CLS APL DERMABOND .7 (GAUZE/BANDAGES/DRESSINGS) ×3
APL SKNCLS STERI-STRIP NONHPOA (GAUZE/BANDAGES/DRESSINGS) ×3
BAG DECANTER FOR FLEXI CONT (MISCELLANEOUS) ×4 IMPLANT
BANDAGE ELASTIC 3 VELCRO ST LF (GAUZE/BANDAGES/DRESSINGS) ×4 IMPLANT
BANDAGE ELASTIC 4 VELCRO ST LF (GAUZE/BANDAGES/DRESSINGS) ×2 IMPLANT
BANDAGE GAUZE 4  KLING STR (GAUZE/BANDAGES/DRESSINGS) ×2 IMPLANT
BANDAGE GAUZE ELAST BULKY 4 IN (GAUZE/BANDAGES/DRESSINGS) ×4 IMPLANT
BENZOIN TINCTURE PRP APPL 2/3 (GAUZE/BANDAGES/DRESSINGS) ×4 IMPLANT
BIT DRILL SPINE QC 12 (BIT) ×2 IMPLANT
BLADE SURG 15 STRL LF DISP TIS (BLADE) ×3 IMPLANT
BLADE SURG 15 STRL SS (BLADE) ×4
BRUSH SCRUB EZ PLAIN DRY (MISCELLANEOUS) ×8 IMPLANT
BUR MATCHSTICK NEURO 3.0 LAGG (BURR) ×4 IMPLANT
CANISTER SUCTION 2500CC (MISCELLANEOUS) ×6 IMPLANT
CLOTH BEACON ORANGE TIMEOUT ST (SAFETY) ×8 IMPLANT
CONT SPEC 4OZ CLIKSEAL STRL BL (MISCELLANEOUS) ×4 IMPLANT
CORDS BIPOLAR (ELECTRODE) ×6 IMPLANT
DECANTER SPIKE VIAL GLASS SM (MISCELLANEOUS) ×4 IMPLANT
DERMABOND ADVANCED (GAUZE/BANDAGES/DRESSINGS) ×1
DERMABOND ADVANCED .7 DNX12 (GAUZE/BANDAGES/DRESSINGS) ×1 IMPLANT
DISPOSABLE MIXING BOWL ×2 IMPLANT
DRAPE C-ARM 42X72 X-RAY (DRAPES) ×8 IMPLANT
DRAPE EXTREMITY T 121X128X90 (DRAPE) ×4 IMPLANT
DRAPE LAPAROTOMY 100X72 PEDS (DRAPES) ×4 IMPLANT
DRAPE MICROSCOPE LEICA (MISCELLANEOUS) ×2 IMPLANT
DRAPE MICROSCOPE ZEISS OPMI (DRAPES) ×2 IMPLANT
DRAPE POUCH INSTRU U-SHP 10X18 (DRAPES) ×4 IMPLANT
DRSG ADAPTIC 3X8 NADH LF (GAUZE/BANDAGES/DRESSINGS) ×2 IMPLANT
DRSG EMULSION OIL 3X3 NADH (GAUZE/BANDAGES/DRESSINGS) ×4 IMPLANT
DRSG OPSITE 4X5.5 SM (GAUZE/BANDAGES/DRESSINGS) ×4 IMPLANT
ELECT COATED BLADE 2.86 ST (ELECTRODE) ×4 IMPLANT
ELECT REM PT RETURN 9FT ADLT (ELECTROSURGICAL) ×4
ELECTRODE REM PT RTRN 9FT ADLT (ELECTROSURGICAL) ×3 IMPLANT
GAUZE SPONGE 4X4 16PLY XRAY LF (GAUZE/BANDAGES/DRESSINGS) ×4 IMPLANT
GLOVE BIO SURGEON STRL SZ 6.5 (GLOVE) IMPLANT
GLOVE BIO SURGEON STRL SZ7 (GLOVE) IMPLANT
GLOVE BIO SURGEON STRL SZ7.5 (GLOVE) IMPLANT
GLOVE BIO SURGEON STRL SZ8 (GLOVE) ×8 IMPLANT
GLOVE BIO SURGEON STRL SZ8.5 (GLOVE) IMPLANT
GLOVE BIOGEL M 8.0 STRL (GLOVE) IMPLANT
GLOVE BIOGEL PI IND STRL 8 (GLOVE) ×2 IMPLANT
GLOVE BIOGEL PI INDICATOR 8 (GLOVE) ×2
GLOVE ECLIPSE 6.5 STRL STRAW (GLOVE) IMPLANT
GLOVE ECLIPSE 7.0 STRL STRAW (GLOVE) IMPLANT
GLOVE ECLIPSE 7.5 STRL STRAW (GLOVE) ×6 IMPLANT
GLOVE ECLIPSE 8.0 STRL XLNG CF (GLOVE) IMPLANT
GLOVE ECLIPSE 8.5 STRL (GLOVE) IMPLANT
GLOVE EXAM NITRILE LRG STRL (GLOVE) IMPLANT
GLOVE EXAM NITRILE MD LF STRL (GLOVE) ×4 IMPLANT
GLOVE EXAM NITRILE XL STR (GLOVE) IMPLANT
GLOVE EXAM NITRILE XS STR PU (GLOVE) IMPLANT
GLOVE INDICATOR 6.5 STRL GRN (GLOVE) IMPLANT
GLOVE INDICATOR 7.0 STRL GRN (GLOVE) IMPLANT
GLOVE INDICATOR 7.5 STRL GRN (GLOVE) IMPLANT
GLOVE INDICATOR 8.0 STRL GRN (GLOVE) IMPLANT
GLOVE INDICATOR 8.5 STRL (GLOVE) ×8 IMPLANT
GLOVE OPTIFIT SS 8.0 STRL (GLOVE) IMPLANT
GLOVE SURG SS PI 6.5 STRL IVOR (GLOVE) IMPLANT
GOWN BRE IMP SLV AUR LG STRL (GOWN DISPOSABLE) ×4 IMPLANT
GOWN BRE IMP SLV AUR XL STRL (GOWN DISPOSABLE) ×8 IMPLANT
GOWN STRL REIN 2XL LVL4 (GOWN DISPOSABLE) ×4 IMPLANT
HAND ALUMI LG (SOFTGOODS) ×4 IMPLANT
HEAD HALTER (SOFTGOODS) ×4 IMPLANT
HEMOSTAT POWDER KIT SURGIFOAM (HEMOSTASIS) ×2 IMPLANT
KIT BASIN OR (CUSTOM PROCEDURE TRAY) ×6 IMPLANT
KIT ROOM TURNOVER OR (KITS) ×6 IMPLANT
MARKER SKIN DUAL TIP RULER LAB (MISCELLANEOUS) ×2 IMPLANT
NDL HYPO 25X1 1.5 SAFETY (NEEDLE) IMPLANT
NDL SPNL 20GX3.5 QUINCKE YW (NEEDLE) ×2 IMPLANT
NEEDLE HYPO 25X1 1.5 SAFETY (NEEDLE) IMPLANT
NEEDLE SPNL 20GX3.5 QUINCKE YW (NEEDLE) ×4 IMPLANT
NS IRRIG 1000ML POUR BTL (IV SOLUTION) ×6 IMPLANT
PACK LAMINECTOMY NEURO (CUSTOM PROCEDURE TRAY) ×4 IMPLANT
PACK SURGICAL SETUP 50X90 (CUSTOM PROCEDURE TRAY) ×4 IMPLANT
PAD ARMBOARD 7.5X6 YLW CONV (MISCELLANEOUS) ×16 IMPLANT
PLATE ANT CERV XTEND 2 LV 32 (Plate) ×2 IMPLANT
PUTTY BONE DBX 2.5 MIS (Bone Implant) ×2 IMPLANT
RUBBERBAND STERILE (MISCELLANEOUS) ×8 IMPLANT
SCREW XTD VAR 4.2 SELF TAP 12 (Screw) ×12 IMPLANT
SPACER COLONIAL 7 SZ 9 (Spacer) ×2 IMPLANT
SPACER COLONIAL LGE 8MM 7DEG (Spacer) ×2 IMPLANT
SPONGE GAUZE 4X4 12PLY (GAUZE/BANDAGES/DRESSINGS) ×6 IMPLANT
SPONGE INTESTINAL PEANUT (DISPOSABLE) ×4 IMPLANT
SPONGE SURGIFOAM ABS GEL SZ50 (HEMOSTASIS) ×4 IMPLANT
STOCKINETTE 4X48 STRL (DRAPES) ×4 IMPLANT
STRIP CLOSURE SKIN 1/2X4 (GAUZE/BANDAGES/DRESSINGS) ×4 IMPLANT
SUT ETHILON 3 0 PS 1 (SUTURE) ×6 IMPLANT
SUT VIC AB 3-0 SH 8-18 (SUTURE) ×4 IMPLANT
SUT VICRYL 4-0 PS2 18IN ABS (SUTURE) ×4 IMPLANT
SYR 20ML ECCENTRIC (SYRINGE) ×4 IMPLANT
SYR BULB 3OZ (MISCELLANEOUS) ×4 IMPLANT
SYR CONTROL 10ML LL (SYRINGE) IMPLANT
TAPE CLOTH 4X10 WHT NS (GAUZE/BANDAGES/DRESSINGS) IMPLANT
TOWEL OR 17X24 6PK STRL BLUE (TOWEL DISPOSABLE) ×6 IMPLANT
TOWEL OR 17X26 10 PK STRL BLUE (TOWEL DISPOSABLE) ×8 IMPLANT
TRAP SPECIMEN MUCOUS 40CC (MISCELLANEOUS) ×4 IMPLANT
TUBE CONNECTING 12X1/4 (SUCTIONS) ×2 IMPLANT
UNDERPAD 30X30 INCONTINENT (UNDERPADS AND DIAPERS) ×4 IMPLANT
WATER STERILE IRR 1000ML POUR (IV SOLUTION) ×8 IMPLANT

## 2011-12-23 NOTE — Transfer of Care (Signed)
Immediate Anesthesia Transfer of Care Note  Patient: Sara Cross  Procedure(s) Performed: Procedure(s) (LRB): ANTERIOR CERVICAL DECOMPRESSION/DISCECTOMY FUSION 2 LEVELS (N/A) CARPAL TUNNEL RELEASE (Left)  Patient Location: PACU  Anesthesia Type: General  Level of Consciousness: awake, alert  and oriented  Airway & Oxygen Therapy: Patient Spontanous Breathing and Patient connected to nasal cannula oxygen  Post-op Assessment: Report given to PACU RN, Post -op Vital signs reviewed and stable and Patient moving all extremities X 4  Post vital signs: Reviewed and stable  Complications: No apparent anesthesia complications

## 2011-12-23 NOTE — Anesthesia Procedure Notes (Signed)
Procedure Name: Intubation Date/Time: 12/23/2011 10:55 AM Performed by: Margaree Mackintosh Pre-anesthesia Checklist: Patient identified, Timeout performed, Emergency Drugs available, Suction available and Patient being monitored Patient Re-evaluated:Patient Re-evaluated prior to inductionOxygen Delivery Method: Circle system utilized Preoxygenation: Pre-oxygenation with 100% oxygen Intubation Type: IV induction Ventilation: Mask ventilation without difficulty Laryngoscope Size: Mac and 3 Grade View: Grade I Tube type: Oral Tube size: 7.5 mm Number of attempts: 1 Airway Equipment and Method: Stylet and LTA kit utilized Placement Confirmation: ETT inserted through vocal cords under direct vision,  positive ETCO2 and breath sounds checked- equal and bilateral Secured at: 22 cm Tube secured with: Tape Dental Injury: Teeth and Oropharynx as per pre-operative assessment

## 2011-12-23 NOTE — Anesthesia Preprocedure Evaluation (Addendum)
Anesthesia Evaluation  Patient identified by MRN, date of birth, ID band Patient awake    Reviewed: Allergy & Precautions, H&P , NPO status , Patient's Chart, lab work & pertinent test results  Airway Mallampati: II TM Distance: >3 FB Neck ROM: Full    Dental  (+) Teeth Intact and Dental Advisory Given   Pulmonary sleep apnea and Continuous Positive Airway Pressure Ventilation ,    Pulmonary exam normal       Cardiovascular hypertension, Pt. on medications Rhythm:regular Rate:Normal     Neuro/Psych Depression negative neurological ROS     GI/Hepatic Neg liver ROS,   Endo/Other  Diabetes mellitus-, Well Controlled, Type 2, Oral Hypoglycemic Agents  Renal/GU negative Renal ROS     Musculoskeletal   Abdominal Normal abdominal exam  (+)   Peds  Hematology   Anesthesia Other Findings   Reproductive/Obstetrics                          Anesthesia Physical Anesthesia Plan  ASA: III  Anesthesia Plan: General   Post-op Pain Management:    Induction: Intravenous  Airway Management Planned: Oral ETT  Additional Equipment:   Intra-op Plan:   Post-operative Plan: Extubation in OR  Informed Consent: I have reviewed the patients History and Physical, chart, labs and discussed the procedure including the risks, benefits and alternatives for the proposed anesthesia with the patient or authorized representative who has indicated his/her understanding and acceptance.   Dental advisory given  Plan Discussed with: Anesthesiologist, Surgeon and CRNA  Anesthesia Plan Comments:        Anesthesia Quick Evaluation

## 2011-12-23 NOTE — Op Note (Signed)
Preoperative diagnosis: Cervical spondylosis with stenosis and radiculopathy at C4-5 and C5-6  Postoperative diagnosis: Same  Procedure: Anterior cervical discectomies and fusion at C4-5 C5-6 using globus peek cages packed with locally harvested autograft mixed with DBX m and the globus extend plating system 32 mm with 6-64mm variable angle screws.  Surgeon: Jillyn Hidden Percival Glasheen  Assistant: Shirlean Kelly  Anesthesia: Gen.  EBL: Minimal  History of present illness: Patient is a very pleasant 51 to female presents with neck pain and left arm pain refractory to all forms of conservative treatment. Workup with MRI scan of her neck showed spondylosis stenosis spinal cord compression and left-sided C5-C6 and radiculopathies. Patient preoperatively had weakness in her left tricep and due to her progression of clinical syndrome MRI findings and failure conservative treatment patient was recommended anterior cervical discectomies and fusion with odorous and benefits of the operation with her as well as  Of course and expectations of outcome alternatives to surgery and she understood and agreed to proceed forward.  Operative procedure: Patient brought in the or was induced under general anesthesia positioned supine the neck in slight extension in 5 pounds of traction the right side and it was prepped and draped in routine sterile fashion. Preoperative localizing appropriate level so a curvilinear incision was made just off midline to the interbody the sternomastoid and the superficial layer of the platysma was dissected out and divided longitudinally. The avascular tissue sternomastoid muscle was drilled down to the fascia precautions doesn't Kitners. Progression identify the C5 disc spaces is marked by making an annulotomy with a 15 blade scalpel and then the longus Cole as reflected laterally and self-retaining retractors placed able to space level. Both the space were further incised large a jar sites were bitten  off of a tractor Kerrison punch drape of the upgoing BA curette and this was removed with the rongeurs the step was placed on the suction tubing and the disc spaces were drilled down the posterior annulus facet complex And bone shavings in a mucous trap. C5-6 aggressive abutting both endplates was carried out decompress the central canal under microscopic illumination. Because sac was identified the pia was removed in piecemeal fashion March across laterally there was extensive amount of spur coming off the posterior aspect C5 vertebral body causing stenosis and compression spinal cord left this is all aggressively under been decompress the central canal March across laterally in both C6 nerve rim and were unroofed decompressed. Discectomies at the stenosis either centrally or foraminally is intact Gelfoam to second C4-5 to similar fashion C4-5 was drilled down aggressive abutting both endplates a decompressive canal at C4-5 there is a marked and hypertrophy displacing both C5 nerve roots is all grossly under been skeletonizing both C5 nerve roots flush with pedicle. At the discectomy here also central canal and foramina were all decompressed the endplates and scraped and 8 mm globus cage packed with local autograft mixed DBX was packed at C4-5 and a 9 mm C56. Then the globus extend plate was placed a Kirschner plate was sized appropriately all 6 screws were placed excellent purchase locking mechanism engaged with to see her get meticulous in space was maintained was closed in layers with after Vicryl and platysma and a running 4 subcuticular or and skin. At the end of case on it counts sponge counts were correct the nurses.

## 2011-12-23 NOTE — H&P (Signed)
Sara Cross is an 56 y.o. female.   Chief Complaint: Neck and left arm pain HPI: Patient is a very pleasant 56 year old female has had long-standing neck and predominantly left arm pain rating down through shoulder in the forearm occasionally into her thumb and first finger she also some numbness and tingling of her sutures her left hand. This refractory to physical therapy anti-inflammatories and time she is to weakness and left upper cavity and an MRI scan that shows cervical spondylosis with stenosis and spinal cord compression and foraminal stenosis at C4-5 and C5-6. In addition she underwent an EMG nerve conduction test which showed left sided carpal tunnel syndrome with axonal changes of median nerve entrapment at the wrist. Spacer was recommended a combined anterior cervical discectomy fusion C4-5 C5-6 as well as left carpal tunnel release. Risks and benefits of the operation as well as The course expectations of outcome of surgery were explained to the patient and she agreed to proceed forward.  Past Medical History  Diagnosis Date  . Hypertension   . Depression   . Pneumonia     hx of  . Sleep apnea     wear cpap  . Diabetes mellitus     takes metformin daily  . Arthritis     Past Surgical History  Procedure Date  . Back surgery     01/2011  . Hand surgery     right   2011    FOR INJURY   . Cesearean     X2  . Toenail excision     BIG TOE RIGHT FOOT  . Toe nail removal     right great toe  . Abdominal hysterectomy 2004  . Colonoscopy     Family History  Problem Relation Age of Onset  . Anesthesia problems Neg Hx   . Hypotension Neg Hx   . Malignant hyperthermia Neg Hx   . Pseudochol deficiency Neg Hx    Social History:  reports that she has been smoking.  She does not have any smokeless tobacco history on file. She reports that she drinks alcohol. She reports that she does not use illicit drugs.  Allergies: No Known Allergies  Medications Prior to Admission    Medication Sig Dispense Refill  . acetaminophen (TYLENOL) 500 MG tablet Take 1,000 mg by mouth every 6 (six) hours as needed. For headache      . aspirin EC 325 MG tablet Take 325 mg by mouth daily.      Marland Kitchen buPROPion (WELLBUTRIN XL) 300 MG 24 hr tablet Take 300 mg by mouth daily.      Marland Kitchen HYDROcodone-acetaminophen (NORCO) 10-325 MG per tablet Take 1 tablet by mouth every 4 (four) hours as needed. pain      . ibuprofen (ADVIL,MOTRIN) 200 MG tablet Take 400 mg by mouth every 6 (six) hours as needed. For pain/headache      . lisinopril (PRINIVIL,ZESTRIL) 20 MG tablet Take 20 mg by mouth daily.      . metFORMIN (GLUCOPHAGE-XR) 500 MG 24 hr tablet Take 1,000 mg by mouth at bedtime.      . triamterene-hydrochlorothiazide (MAXZIDE-25) 37.5-25 MG per tablet Take 2 tablets by mouth daily.      . Vitamin D, Ergocalciferol, (DRISDOL) 50000 UNITS CAPS Take 50,000 Units by mouth 3 (three) times a week. Takes on mondays, wednesdays, and fridays        Results for orders placed during the hospital encounter of 12/23/11 (from the past 48 hour(s))  GLUCOSE, CAPILLARY  Status: Abnormal   Collection Time   12/23/11 10:03 AM      Component Value Range Comment   Glucose-Capillary 153 (*) 70 - 99 (mg/dL)    No results found.  ROS  Blood pressure 120/77, pulse 87, temperature 98 F (36.7 C), temperature source Oral, resp. rate 20, SpO2 95.00%. Patient is awake alert oriented strength is 5 out of 5 in deltoids also 5 out of 5 biceps triceps wrist flexion extension and intrinsics on the right. Left upper extremity and some weakness in the left triceps 4+ out of 5 otherwise 5 out 5. Lower extremities are 5 out of 5 diffusely. Physical Exam   Assessment/Plan 56 year old female who presents for anterior cervical discectomy and fusion at C4-5 and C5-6 and left carpal tunnel release.  Kaleea Penner P 12/23/2011, 10:37 AM

## 2011-12-23 NOTE — Op Note (Signed)
Preoperative diagnosis: left carpal tunnel syndrome  Postoperative diagnosis: Same  Procedure: Left carpal tunnel release  Surgeon: Jillyn Hidden Nicoles Sedlacek  Anesthesia: Gen.  EBL: Minimal  History of present illness: Patient is a 56 year old female whose had pain numbness tingling of her sitting as of her left hand is been refractory to all forms of conservative treatment. EMG documented severe median nerve entrapment at the wrist and due to the EMG findings failure conservative treatment and progression of clinical syndrome patient was recommended left carpal tunnel release.  Operative procedure: After completion of her ACDF the left hand was prepped and draped in routine sterile fashion. Anesthesia Christiane Ha the distal crease the wrist along the palmar crease to the middle finger proximal L4 centers along. After infiltration to 8 cc lidocaine with epi and incision was made and the subcutaneous tissues dissected free down to the flexor retinaculum and transverse carpal ligament. The ligament was incised sequentially in layers until the epidermis the median nerve is identified. It was noted be markedly hypertrophied and this was divided both proximally and distally until hemostat easily passed along the carpal tunnel without resistance. The wounds and to proceed her get meticulous in a stasis was maintained the skin was reapproximated with interrupted vertical mattress and the hand was draped and patient recovered in stable condition at the end of case all needle counts and sponge counts were correct.

## 2011-12-23 NOTE — Preoperative (Signed)
Beta Blockers   Reason not to administer Beta Blockers:Not Applicable 

## 2011-12-24 MED ORDER — CYCLOBENZAPRINE HCL 10 MG PO TABS: 10.0000 mg | ORAL_TABLET | Freq: Three times a day (TID) | ORAL | Status: AC | PRN

## 2011-12-24 MED ORDER — OXYCODONE-ACETAMINOPHEN 5-325 MG PO TABS: 1.0000 | ORAL_TABLET | ORAL | Status: AC | PRN

## 2011-12-24 NOTE — Discharge Summary (Signed)
Physician Discharge Summary  Patient ID: Sara Cross MRN: [id number] DOB/AGE: 20-Feb-1956 56 y.o.  Admit date: 12/23/2011 Discharge date: 12/24/2011  Admission Diagnoses:Cervical spondylosis with stenosis and radiculopathy at C4-5 and C5-6 left carpal tunnel syndrome   Discharge Diagnoses: :Cervical spondylosis with stenosis and radiculopathy at C4-5 and C5-6 left carpal tunnel syndrome  Active Problems:  * No active hospital problems. *    Discharged Condition: good  Hospital Course: pt admitted day of surgery - underwent procedures below - pt doing well, less arm sx's - swallowing well, voiding,   Consults: None  Significant Diagnostic Studies: none  Treatments: surgery: Left carpal tunnel release, Anterior cervical discectomies and fusion at C4-5 C5-6 using globus peek cages packed with locally harvested autograft mixed with DBX m and the globus extend plating system 32 mm with 6-11mm variable angle screws.       Discharge Exam: Blood pressure 104/65, pulse 72, temperature 98 F (36.7 C), temperature source Oral, resp. rate 16, SpO2 95.00%. Wound:c/d/i  Disposition: home   Medication List  As of 12/24/2011  7:58 AM   STOP taking these medications         ibuprofen 200 MG tablet         TAKE these medications         acetaminophen 500 MG tablet   Commonly known as: TYLENOL   Take 1,000 mg by mouth every 6 (six) hours as needed. For headache      aspirin EC 325 MG tablet   Take 325 mg by mouth daily.      buPROPion 300 MG 24 hr tablet   Commonly known as: WELLBUTRIN XL   Take 300 mg by mouth daily.      cyclobenzaprine 10 MG tablet   Commonly known as: FLEXERIL   Take 1 tablet (10 mg total) by mouth 3 (three) times daily as needed for muscle spasms.      HYDROcodone-acetaminophen 10-325 MG per tablet   Commonly known as: NORCO   Take 1 tablet by mouth every 4 (four) hours as needed. pain      lisinopril 20 MG tablet   Commonly known as:  PRINIVIL,ZESTRIL   Take 20 mg by mouth daily.      metFORMIN 500 MG 24 hr tablet   Commonly known as: GLUCOPHAGE-XR   Take 1,000 mg by mouth at bedtime.      oxyCODONE-acetaminophen 5-325 MG per tablet   Commonly known as: PERCOCET   Take 1-2 tablets by mouth every 4 (four) hours as needed.      triamterene-hydrochlorothiazide 37.5-25 MG per tablet   Commonly known as: MAXZIDE-25   Take 2 tablets by mouth daily.      Vitamin D (Ergocalciferol) 50000 UNITS Caps   Commonly known as: DRISDOL   Take 50,000 Units by mouth 3 (three) times a week. Takes on mondays, wednesdays, and fridays             Signed: Clydene Fake, MD 12/24/2011, 7:58 AM

## 2011-12-24 NOTE — Discharge Instructions (Signed)
Wound Care Keep incision covered and dry for 2 days.  If you shower prior to then, cover incision with plastic wrap.  You may remove outer bandage.  Do not put any creams, lotions, or ointments on incision. Leave steri-strips on neck.  They will fall off by themselves. Activity Walk each and every day, increasing distance each day. No lifting greater than 5 lbs.  Avoid excessive neck motion. No driving for 2 weeks; may ride as a passenger locally. Wear neck brace at all times except when showering or otherwise instructed. Diet Resume your normal diet.  Return to Work Will be discussed at you follow up appointment. Call Your Doctor If Any of These Occur Redness, drainage, or swelling at the wound.  Temperature greater than 101 degrees. Severe pain not relieved by pain medication. Increased difficulty swallowing.  Incision starts to come apart. Follow Up Appt Call today for appointment in 1-2 weeks [phone]) or for problems.  If you have any hardware placed in your spine, you will need an x-ray before your appointment.

## 2011-12-26 ENCOUNTER — Encounter (HOSPITAL_COMMUNITY): Payer: Self-pay | Admitting: Neurosurgery

## 2011-12-26 LAB — GLUCOSE, CAPILLARY
Glucose-Capillary: 178 mg/dL — ABNORMAL HIGH (ref 70–99)
Glucose-Capillary: 132 mg/dL — ABNORMAL HIGH (ref 70–99)

## 2012-01-10 NOTE — Anesthesia Postprocedure Evaluation (Signed)
  Anesthesia Post-op Note  Patient: Sara Cross  Procedure(s) Performed: Procedure(s) (LRB): ANTERIOR CERVICAL DECOMPRESSION/DISCECTOMY FUSION 2 LEVELS (N/A) CARPAL TUNNEL RELEASE (Left)  Patient Location: PACU  Anesthesia Type: General  Level of Consciousness: awake, alert , oriented and patient cooperative  Airway and Oxygen Therapy: Patient Spontanous Breathing and Patient connected to nasal cannula oxygen  Post-op Pain: mild  Post-op Assessment: Post-op Vital signs reviewed, Patient's Cardiovascular Status Stable, Respiratory Function Stable, Patent Airway, No signs of Nausea or vomiting and Pain level controlled  Post-op Vital Signs: stable  Complications: No apparent anesthesia complications

## 2012-02-01 ENCOUNTER — Ambulatory Visit
Admission: RE | Admit: 2012-02-01 | Discharge: 2012-02-01 | Disposition: A | Discharge status: Home / Self Care | Payer: BC Managed Care – PPO | Source: Ambulatory Visit | Attending: Neurosurgery | Admitting: Neurosurgery

## 2012-02-01 DIAGNOSIS — M545 Low back pain: Secondary | ICD-10-CM

## 2012-02-17 ENCOUNTER — Ambulatory Visit: Payer: BC Managed Care – PPO | Attending: Neurosurgery | Admitting: Rehabilitation

## 2012-02-17 DIAGNOSIS — IMO0001 Reserved for inherently not codable concepts without codable children: Secondary | ICD-10-CM | POA: Insufficient documentation

## 2012-02-17 DIAGNOSIS — M545 Low back pain, unspecified: Secondary | ICD-10-CM | POA: Insufficient documentation

## 2012-02-17 DIAGNOSIS — M25559 Pain in unspecified hip: Secondary | ICD-10-CM | POA: Insufficient documentation

## 2012-02-20 ENCOUNTER — Ambulatory Visit: Payer: BC Managed Care – PPO | Admitting: Physical Therapy

## 2012-02-22 ENCOUNTER — Ambulatory Visit: Payer: BC Managed Care – PPO | Admitting: Physical Therapy

## 2012-02-24 ENCOUNTER — Ambulatory Visit: Payer: BC Managed Care – PPO | Admitting: Physical Therapy

## 2012-02-27 ENCOUNTER — Ambulatory Visit: Payer: BC Managed Care – PPO | Admitting: Physical Therapy

## 2012-02-29 ENCOUNTER — Ambulatory Visit: Payer: BC Managed Care – PPO | Admitting: Physical Therapy

## 2012-03-05 ENCOUNTER — Ambulatory Visit: Payer: BC Managed Care – PPO | Admitting: Physical Therapy

## 2012-03-07 ENCOUNTER — Ambulatory Visit: Payer: BC Managed Care – PPO | Admitting: Physical Therapy

## 2012-03-12 ENCOUNTER — Ambulatory Visit: Payer: BC Managed Care – PPO | Admitting: Rehabilitation

## 2012-03-14 ENCOUNTER — Ambulatory Visit: Payer: BC Managed Care – PPO | Admitting: Rehabilitation

## 2012-03-19 ENCOUNTER — Ambulatory Visit: Payer: BC Managed Care – PPO | Attending: Neurosurgery | Admitting: Physical Therapy

## 2012-03-19 DIAGNOSIS — IMO0001 Reserved for inherently not codable concepts without codable children: Secondary | ICD-10-CM | POA: Insufficient documentation

## 2012-03-19 DIAGNOSIS — M545 Low back pain, unspecified: Secondary | ICD-10-CM | POA: Insufficient documentation

## 2012-03-19 DIAGNOSIS — M25559 Pain in unspecified hip: Secondary | ICD-10-CM | POA: Insufficient documentation

## 2012-03-21 ENCOUNTER — Ambulatory Visit: Payer: BC Managed Care – PPO | Admitting: Physical Therapy

## 2012-03-26 ENCOUNTER — Ambulatory Visit: Payer: BC Managed Care – PPO | Admitting: Physical Therapy

## 2012-03-28 ENCOUNTER — Ambulatory Visit: Payer: BC Managed Care – PPO | Admitting: Physical Therapy

## 2012-04-26 ENCOUNTER — Encounter (INDEPENDENT_AMBULATORY_CARE_PROVIDER_SITE_OTHER): Payer: Self-pay | Admitting: Surgery

## 2012-04-26 ENCOUNTER — Ambulatory Visit (INDEPENDENT_AMBULATORY_CARE_PROVIDER_SITE_OTHER): Payer: BC Managed Care – PPO | Admitting: Surgery

## 2012-04-26 VITALS — BP 134/88 | HR 84 | Temp 97.9°F | Resp 16 | Ht 62.0 in | Wt 223.2 lb

## 2012-04-26 DIAGNOSIS — E119 Type 2 diabetes mellitus without complications: Secondary | ICD-10-CM

## 2012-04-26 DIAGNOSIS — M129 Arthropathy, unspecified: Secondary | ICD-10-CM

## 2012-04-26 DIAGNOSIS — Z6841 Body Mass Index (BMI) 40.0 and over, adult: Secondary | ICD-10-CM

## 2012-04-26 DIAGNOSIS — M1990 Unspecified osteoarthritis, unspecified site: Secondary | ICD-10-CM

## 2012-04-26 DIAGNOSIS — E785 Hyperlipidemia, unspecified: Secondary | ICD-10-CM

## 2012-04-26 DIAGNOSIS — E139 Other specified diabetes mellitus without complications: Secondary | ICD-10-CM

## 2012-04-26 LAB — CBC WITH DIFFERENTIAL/PLATELET
Basophils Relative: 0 % (ref 0–1)
Eosinophils Absolute: 0.4 10*3/uL (ref 0.0–0.7)
Lymphs Abs: 1.8 10*3/uL (ref 0.7–4.0)
MCH: 30 pg (ref 26.0–34.0)
MCHC: 34.3 g/dL (ref 30.0–36.0)
Neutrophils Relative %: 65 % (ref 43–77)
Platelets: 277 10*3/uL (ref 150–400)
RBC: 4.14 MIL/uL (ref 3.87–5.11)

## 2012-04-26 NOTE — Progress Notes (Signed)
Chief Complaint:  Morbid obesity and DM  History of Present Illness:  Sara Cross is an 56 y.o. female who has attended our seminar and comes in requesting lapband surgery.  She has had a good response from 30 pound weight loss with loss of her need for metformin. I indicated that a bypass may be a better operation for more resistant diabetes mellitus but I thought she might well responded very nicely to a LAP-BAND.  She's tried multiple weight loss efforts including the weight loss requiring B12 shots again losing 30-40 pounds.  Currently her BMI is 41 and her comorbidities include diabetes mellitus, obstructive sleep apnea for which he wears a CPAP, essential hypertension which she said since she was 56 years old, and arthritis. She also has hyperlipidemia. I think should be a good candidate for Laband and will begin this journey.  Past Medical History  Diagnosis Date  . Hypertension   . Depression   . Pneumonia     hx of  . Sleep apnea     wear cpap  . Diabetes mellitus     takes metformin daily  . Arthritis   . Hyperlipidemia     Past Surgical History  Procedure Date  . Back surgery     01/2011  . Hand surgery     right   2011    FOR INJURY   . Cesearean     X2  . Toenail excision     BIG TOE RIGHT FOOT  . Toe nail removal     right great toe  . Abdominal hysterectomy 2004  . Colonoscopy   . Anterior cervical decomp/discectomy fusion 12/23/2011    Procedure: ANTERIOR CERVICAL DECOMPRESSION/DISCECTOMY FUSION 2 LEVELS;  Surgeon: Mariam Dollar, MD;  Location: [facility];  Service: Neurosurgery;  Laterality: N/A;  Anterior Cervical Four-Five/Five-Six Decompression and Fusion  . Carpal tunnel release 12/23/2011    Procedure: CARPAL TUNNEL RELEASE;  Surgeon: Mariam Dollar, MD;  Location: [facility];  Service: Neurosurgery;  Laterality: Left;  Left Carpal Tunnel Release    Current Outpatient Prescriptions  Medication Sig Dispense Refill  . acetaminophen (TYLENOL) 500 MG tablet  Take 1,000 mg by mouth every 6 (six) hours as needed. For headache      . aspirin EC 325 MG tablet Take 325 mg by mouth daily.      Marland Kitchen buPROPion (WELLBUTRIN XL) 300 MG 24 hr tablet Take 300 mg by mouth daily.      . cyclobenzaprine (FLEXERIL) 10 MG tablet Take 10 mg by mouth 3 (three) times daily as needed.      . fish oil-omega-3 fatty acids 1000 MG capsule Take 2 g by mouth daily.      Marland Kitchen HYDROcodone-acetaminophen (NORCO) 10-325 MG per tablet Take 1 tablet by mouth every 4 (four) hours as needed. pain      . lisinopril (PRINIVIL,ZESTRIL) 20 MG tablet Take 20 mg by mouth daily.      . metFORMIN (GLUCOPHAGE-XR) 500 MG 24 hr tablet Take 1,000 mg by mouth at bedtime.      . triamterene-hydrochlorothiazide (MAXZIDE-25) 37.5-25 MG per tablet Take 2 tablets by mouth daily.      . Vitamin D, Ergocalciferol, (DRISDOL) 50000 UNITS CAPS Take 50,000 Units by mouth 3 (three) times a week. Takes on mondays, wednesdays, and fridays       Review of patient's allergies indicates no known allergies. Family History  Problem Relation Age of Onset  . Anesthesia problems Neg Hx   .  Hypotension Neg Hx   . Malignant hyperthermia Neg Hx   . Pseudochol deficiency Neg Hx   . Cancer Father    Social History:   reports that she has been smoking.  She does not have any smokeless tobacco history on file. She reports that she drinks alcohol. She reports that she does not use illicit drugs.   REVIEW OF SYSTEMS - PERTINENT POSITIVES ONLY: negative  Physical Exam:   Blood pressure 134/88, pulse 84, temperature 97.9 F (36.6 C), resp. rate 16, height 5\' 2"  (1.575 m), weight 223 lb 3.2 oz (101.243 kg). Body mass index is 40.82 kg/(m^2).  Gen:  WDWN WF NAD  Neurological: Alert and oriented to person, place, and time. Motor and sensory function is grossly intact  Head: Normocephalic and atraumatic.  Eyes: Conjunctivae are normal. Pupils are equal, round, and reactive to light. No scleral icterus.  Neck: Normal range of  motion. Neck supple. No tracheal deviation or thyromegaly present.  Cardiovascular:  SR without murmurs or gallops.  No carotid bruits Respiratory: Effort normal.  No respiratory distress. No chest wall tenderness. Breath sounds normal.  No wheezes, rales or rhonchi.  Abdomen:  nontender GU: Musculoskeletal: Normal range of motion. Extremities are nontender. No cyanosis, edema or clubbing noted Lymphadenopathy: No cervical, preauricular, postauricular or axillary adenopathy is present Skin: Skin is warm and dry. No rash noted. No diaphoresis. No erythema. No pallor. Pscyh: Normal mood and affect. Behavior is normal. Judgment and thought content normal.   LABORATORY RESULTS: No results found for this or any previous visit (from the past 48 hour(s)).  RADIOLOGY RESULTS: No results found.  Problem List: Patient Active Problem List  Diagnosis  . FIBROIDS, UTERUS  . OBSTRUCTIVE SLEEP APNEA  . HYPERTENSION    Assessment & Plan: Morbid obesity with multiple comorbidities Begin workup for lapband     Matt B. Daphine Deutscher, MD, [facility] [phone] beeper [phone]  04/26/2012 11:52 AM

## 2012-04-26 NOTE — Patient Instructions (Addendum)

## 2012-04-27 LAB — COMPREHENSIVE METABOLIC PANEL
ALT: 19 U/L (ref 0–35)
Alkaline Phosphatase: 94 U/L (ref 39–117)
Sodium: 136 mEq/L (ref 135–145)
Total Bilirubin: 0.3 mg/dL (ref 0.3–1.2)
Total Protein: 6.7 g/dL (ref 6.0–8.3)

## 2012-05-04 ENCOUNTER — Encounter: Payer: Self-pay | Admitting: *Deleted

## 2012-05-04 ENCOUNTER — Encounter: Payer: BC Managed Care – PPO | Attending: Surgery | Admitting: *Deleted

## 2012-05-04 DIAGNOSIS — Z01818 Encounter for other preprocedural examination: Secondary | ICD-10-CM | POA: Insufficient documentation

## 2012-05-04 DIAGNOSIS — Z713 Dietary counseling and surveillance: Secondary | ICD-10-CM | POA: Insufficient documentation

## 2012-05-04 NOTE — Patient Instructions (Addendum)
   Follow Pre-Op Nutrition Goals to prepare for Lapband Surgery.   Call the [facility] at [phone] once you have been given your surgery date to enrolled in the Pre-Op Nutrition Class. You will need to attend this nutrition class 3-4 weeks prior to your surgery. 

## 2012-05-04 NOTE — Progress Notes (Signed)
  Pre-Op Assessment Visit:  Pre-Operative LAGB Surgery  Medical Nutrition Therapy:  Appt start time: 0900   End time:  1000.  Patient was seen on 05/04/2012 for Pre-Operative LAGB Nutrition Assessment. Assessment and letter of approval faxed to [facility] Program coordinator on 05/04/2012.  Approval letter sent to [facility] center and will be available in the chart under the media tab.  Handouts given during visit include:  Pre-Op Goals   Bariatric Surgery Protein Shakes handout  Patient to call for Pre-Op and Post-Op Nutrition Education at the [facility] when surgery is scheduled.

## 2012-05-16 ENCOUNTER — Ambulatory Visit: Payer: BC Managed Care – PPO | Admitting: *Deleted

## 2012-05-23 ENCOUNTER — Telehealth (INDEPENDENT_AMBULATORY_CARE_PROVIDER_SITE_OTHER): Payer: Self-pay | Admitting: General Surgery

## 2012-05-23 ENCOUNTER — Ambulatory Visit (HOSPITAL_COMMUNITY): Payer: BC Managed Care – PPO

## 2012-05-23 NOTE — Telephone Encounter (Signed)
Dr. Sharma Covert called from Radiology to let us know that this patient did not show up for any of her studies today.

## 2012-06-05 ENCOUNTER — Ambulatory Visit (HOSPITAL_COMMUNITY): Admission: RE | Admit: 2012-06-05 | Payer: BC Managed Care – PPO | Source: Ambulatory Visit | Admitting: Surgery

## 2012-06-05 ENCOUNTER — Encounter (HOSPITAL_COMMUNITY): Admission: RE | Payer: Self-pay | Source: Ambulatory Visit

## 2012-06-05 SURGERY — BREATH TEST, FOR HELICOBACTER PYLORI

## 2013-05-03 ENCOUNTER — Other Ambulatory Visit: Payer: Self-pay | Admitting: Neurosurgery

## 2013-05-03 DIAGNOSIS — M5126 Other intervertebral disc displacement, lumbar region: Secondary | ICD-10-CM

## 2013-05-20 ENCOUNTER — Ambulatory Visit
Admission: RE | Admit: 2013-05-20 | Discharge: 2013-05-20 | Disposition: A | Discharge status: Home / Self Care | Payer: Medicare Other | Source: Ambulatory Visit | Attending: Neurosurgery | Admitting: Neurosurgery

## 2013-05-20 DIAGNOSIS — M5126 Other intervertebral disc displacement, lumbar region: Secondary | ICD-10-CM

## 2013-05-20 MED ORDER — GADOBENATE DIMEGLUMINE 529 MG/ML IV SOLN
19.0000 mL | Freq: Once | INTRAVENOUS | Status: AC | PRN
  Administered 2013-05-20: 19 mL via INTRAVENOUS

## 2013-05-28 ENCOUNTER — Other Ambulatory Visit: Payer: Self-pay | Admitting: Neurosurgery

## 2013-06-03 ENCOUNTER — Encounter (HOSPITAL_COMMUNITY): Payer: Self-pay | Admitting: Pharmacy Technician

## 2013-06-04 ENCOUNTER — Encounter (HOSPITAL_COMMUNITY)
Admission: RE | Admit: 2013-06-04 | Discharge: 2013-06-04 | Disposition: A | Discharge status: Home / Self Care | Payer: Medicare Other | Source: Ambulatory Visit | Attending: Neurosurgery | Admitting: Neurosurgery

## 2013-06-04 ENCOUNTER — Encounter (HOSPITAL_COMMUNITY): Payer: Self-pay

## 2013-06-04 ENCOUNTER — Encounter (HOSPITAL_COMMUNITY)
Admission: RE | Admit: 2013-06-04 | Discharge: 2013-06-04 | Disposition: A | Discharge status: Home / Self Care | Payer: Medicare Other | Source: Ambulatory Visit | Attending: Anesthesiology | Admitting: Anesthesiology

## 2013-06-04 DIAGNOSIS — Z01812 Encounter for preprocedural laboratory examination: Secondary | ICD-10-CM | POA: Insufficient documentation

## 2013-06-04 DIAGNOSIS — Z79899 Other long term (current) drug therapy: Secondary | ICD-10-CM | POA: Insufficient documentation

## 2013-06-04 DIAGNOSIS — K219 Gastro-esophageal reflux disease without esophagitis: Secondary | ICD-10-CM | POA: Insufficient documentation

## 2013-06-04 DIAGNOSIS — E119 Type 2 diabetes mellitus without complications: Secondary | ICD-10-CM | POA: Insufficient documentation

## 2013-06-04 DIAGNOSIS — M5000 Cervical disc disorder with myelopathy, unspecified cervical region: Secondary | ICD-10-CM | POA: Insufficient documentation

## 2013-06-04 DIAGNOSIS — Z01810 Encounter for preprocedural cardiovascular examination: Secondary | ICD-10-CM | POA: Insufficient documentation

## 2013-06-04 DIAGNOSIS — Z01818 Encounter for other preprocedural examination: Secondary | ICD-10-CM | POA: Insufficient documentation

## 2013-06-04 DIAGNOSIS — I10 Essential (primary) hypertension: Secondary | ICD-10-CM | POA: Insufficient documentation

## 2013-06-04 DIAGNOSIS — M4712 Other spondylosis with myelopathy, cervical region: Secondary | ICD-10-CM | POA: Insufficient documentation

## 2013-06-04 DIAGNOSIS — Z472 Encounter for removal of internal fixation device: Secondary | ICD-10-CM | POA: Insufficient documentation

## 2013-06-04 DIAGNOSIS — Z981 Arthrodesis status: Secondary | ICD-10-CM | POA: Insufficient documentation

## 2013-06-04 HISTORY — DX: Headache: R51

## 2013-06-04 HISTORY — DX: Gastro-esophageal reflux disease without esophagitis: K21.9

## 2013-06-04 HISTORY — DX: Dizziness and giddiness: R42

## 2013-06-04 LAB — BASIC METABOLIC PANEL
BUN: 10 mg/dL (ref 6–23)
CO2: 29 mEq/L (ref 19–32)
Calcium: 9.4 mg/dL (ref 8.4–10.5)
Chloride: 100 mEq/L (ref 96–112)
GFR calc Af Amer: >90 mL/min (ref >90)
Potassium: 5 mEq/L (ref 3.5–5.1)

## 2013-06-04 LAB — CBC
HCT: 38.5 % (ref 36.0–46.0)
Hemoglobin: 13 g/dL (ref 12.0–15.0)
RBC: 4.16 MIL/uL (ref 3.87–5.11)
RDW: 14.9 % (ref 11.5–15.5)
WBC: 7.7 10*3/uL (ref 4.0–10.5)

## 2013-06-04 LAB — SURGICAL PCR SCREEN
MRSA, PCR: NEGATIVE
Staphylococcus aureus: NEGATIVE

## 2013-06-04 NOTE — Pre-Procedure Instructions (Signed)
Sara Cross  06/04/2013   Your procedure is scheduled on:  Friday, October 24th  Report to Main Entrance "A" and check in with admitting at 0530 AM.  Call this number if you have problems the morning of surgery: [phone]   Remember:   Do not eat food or drink liquids after midnight.   Take these medicines the morning of surgery with A SIP OF WATER: Celexa, Neurontin, Prilosec, Percocet if needed  Do NOT take any diabetes medication on morning of surgery. Stop taking over the counter vitamins, aspirin, NSAIDS as of today.   Do not wear jewelry, make-up or nail polish.  Do not wear lotions, powders, or perfumes. You may wear deodorant.  Do not shave 48 hours prior to surgery. Men may shave face and neck.  Do not bring valuables to the hospital.  [facility] is not responsible   for any belongings or valuables.               Contacts, dentures or bridgework may not be worn into surgery.  Leave suitcase in the car. After surgery it may be brought to your room.  For patients admitted to the hospital, discharge time is determined by your treatment team.               Patients discharged the day of surgery will not be allowed to drive home.    Special Instructions: Shower using CHG 2 nights before surgery and the night before surgery.  If you shower the day of surgery use CHG.  Use special wash - you have one bottle of CHG for all showers.  You should use approximately 1/3 of the bottle for each shower.   Please read over the following fact sheets that you were given: Pain Booklet, Coughing and Deep Breathing, MRSA Information and Surgical Site Infection Prevention

## 2013-06-04 NOTE — Progress Notes (Signed)
Primary physician - Dr. Astrid Divine  Does not have cardiologist Has not had cardiac testing recently.

## 2013-06-06 ENCOUNTER — Ambulatory Visit
Admission: RE | Admit: 2013-06-06 | Discharge: 2013-06-06 | Disposition: A | Discharge status: Home / Self Care | Payer: Medicare Other | Source: Ambulatory Visit | Attending: Neurosurgery | Admitting: Neurosurgery

## 2013-06-06 ENCOUNTER — Other Ambulatory Visit: Payer: Self-pay | Admitting: Neurosurgery

## 2013-06-06 DIAGNOSIS — M4712 Other spondylosis with myelopathy, cervical region: Secondary | ICD-10-CM

## 2013-06-07 ENCOUNTER — Encounter (HOSPITAL_COMMUNITY): Payer: Self-pay | Admitting: *Deleted

## 2013-06-07 ENCOUNTER — Encounter (HOSPITAL_COMMUNITY): Payer: Medicare Other | Admitting: Anesthesiology

## 2013-06-07 ENCOUNTER — Ambulatory Visit (HOSPITAL_COMMUNITY)
Admission: RE | Admit: 2013-06-07 | Discharge: 2013-06-08 | Disposition: A | Discharge status: Home / Self Care | Payer: Medicare Other | Source: Ambulatory Visit | Attending: Neurosurgery | Admitting: Neurosurgery

## 2013-06-07 ENCOUNTER — Ambulatory Visit (HOSPITAL_COMMUNITY): Payer: Medicare Other | Admitting: Anesthesiology

## 2013-06-07 ENCOUNTER — Encounter (HOSPITAL_COMMUNITY)
Admission: RE | Disposition: A | Discharge status: Home / Self Care | Payer: Self-pay | Source: Ambulatory Visit | Attending: Neurosurgery

## 2013-06-07 ENCOUNTER — Ambulatory Visit (HOSPITAL_COMMUNITY): Payer: Medicare Other

## 2013-06-07 HISTORY — PX: ANTERIOR CERVICAL DECOMP/DISCECTOMY FUSION: SHX1161

## 2013-06-07 LAB — GLUCOSE, CAPILLARY
Glucose-Capillary: 152 mg/dL — ABNORMAL HIGH (ref 70–99)
Glucose-Capillary: 186 mg/dL — ABNORMAL HIGH (ref 70–99)
Glucose-Capillary: 173 mg/dL — ABNORMAL HIGH (ref 70–99)
Glucose-Capillary: 220 mg/dL — ABNORMAL HIGH (ref 70–99)
Glucose-Capillary: 241 mg/dL — ABNORMAL HIGH (ref 70–99)

## 2013-06-07 SURGERY — ANTERIOR CERVICAL DECOMPRESSION/DISCECTOMY FUSION 1 LEVEL/HARDWARE REMOVAL
Anesthesia: General | Site: Neck | Wound class: Clean

## 2013-06-07 MED ORDER — ROCURONIUM BROMIDE 100 MG/10ML IV SOLN
INTRAVENOUS | Status: DC | PRN
  Administered 2013-06-07: 10 mg via INTRAVENOUS
  Administered 2013-06-07: 50 mg via INTRAVENOUS

## 2013-06-07 MED ORDER — PHENOL 1.4 % MT LIQD: 1.0000 | OROMUCOSAL | Status: DC | PRN

## 2013-06-07 MED ORDER — GABAPENTIN 300 MG PO CAPS: 600.0000 mg | ORAL_CAPSULE | Freq: Two times a day (BID) | ORAL | Status: DC

## 2013-06-07 MED ORDER — OXYCODONE-ACETAMINOPHEN 5-325 MG PO TABS: 1.0000 | ORAL_TABLET | ORAL | Status: DC | PRN

## 2013-06-07 MED ORDER — CEFAZOLIN SODIUM 1-5 GM-% IV SOLN
1.0000 g | Freq: Three times a day (TID) | INTRAVENOUS | Status: AC
  Administered 2013-06-07 (×2): 1 g via INTRAVENOUS
  Filled 2013-06-07 (×2): qty 50

## 2013-06-07 MED ORDER — HYDROMORPHONE HCL PF 1 MG/ML IJ SOLN
0.2500 mg | INTRAMUSCULAR | Status: DC | PRN
  Administered 2013-06-07 (×2): 0.5 mg via INTRAVENOUS

## 2013-06-07 MED ORDER — HYDROMORPHONE HCL PF 1 MG/ML IJ SOLN
0.5000 mg | INTRAMUSCULAR | Status: DC | PRN
  Administered 2013-06-07 – 2013-06-08 (×3): 1 mg via INTRAVENOUS
  Filled 2013-06-07 (×3): qty 1

## 2013-06-07 MED ORDER — ACETAMINOPHEN 650 MG RE SUPP: 650.0000 mg | RECTAL | Status: DC | PRN

## 2013-06-07 MED ORDER — THROMBIN 5000 UNITS EX SOLR
CUTANEOUS | Status: DC | PRN
  Administered 2013-06-07 (×2): 5000 [IU] via TOPICAL

## 2013-06-07 MED ORDER — SODIUM CHLORIDE 0.9 % IR SOLN
Status: DC | PRN
  Administered 2013-06-07: 08:00:00

## 2013-06-07 MED ORDER — OXYCODONE HCL 5 MG PO TABS
ORAL_TABLET | ORAL | Status: AC
  Filled 2013-06-07: qty 1

## 2013-06-07 MED ORDER — CYCLOBENZAPRINE HCL 10 MG PO TABS
10.0000 mg | ORAL_TABLET | Freq: Three times a day (TID) | ORAL | Status: DC | PRN
  Administered 2013-06-07: 10 mg via ORAL
  Filled 2013-06-07: qty 1

## 2013-06-07 MED ORDER — ONDANSETRON HCL 4 MG/2ML IJ SOLN
INTRAMUSCULAR | Status: DC | PRN
  Administered 2013-06-07: 4 mg via INTRAVENOUS

## 2013-06-07 MED ORDER — GLYCOPYRROLATE 0.2 MG/ML IJ SOLN
INTRAMUSCULAR | Status: DC | PRN
  Administered 2013-06-07: 0.2 mg via INTRAVENOUS
  Administered 2013-06-07: 0.4 mg via INTRAVENOUS

## 2013-06-07 MED ORDER — FUROSEMIDE 40 MG PO TABS
40.0000 mg | ORAL_TABLET | Freq: Every day | ORAL | Status: DC
  Administered 2013-06-07 – 2013-06-08 (×2): 40 mg via ORAL
  Filled 2013-06-07 (×2): qty 1

## 2013-06-07 MED ORDER — ARTIFICIAL TEARS OP OINT
TOPICAL_OINTMENT | OPHTHALMIC | Status: DC | PRN
  Administered 2013-06-07: 1 via OPHTHALMIC

## 2013-06-07 MED ORDER — MIDAZOLAM HCL 5 MG/5ML IJ SOLN
INTRAMUSCULAR | Status: DC | PRN
  Administered 2013-06-07: 2 mg via INTRAVENOUS

## 2013-06-07 MED ORDER — 0.9 % SODIUM CHLORIDE (POUR BTL) OPTIME
TOPICAL | Status: DC | PRN
  Administered 2013-06-07: 1000 mL

## 2013-06-07 MED ORDER — NEOSTIGMINE METHYLSULFATE 1 MG/ML IJ SOLN
INTRAMUSCULAR | Status: DC | PRN
  Administered 2013-06-07 (×2): 2 mg via INTRAVENOUS

## 2013-06-07 MED ORDER — OXYCODONE HCL 5 MG/5ML PO SOLN: 5.0000 mg | Freq: Once | ORAL | Status: AC | PRN

## 2013-06-07 MED ORDER — PANTOPRAZOLE SODIUM 40 MG PO TBEC
40.0000 mg | DELAYED_RELEASE_TABLET | Freq: Every day | ORAL | Status: DC
  Administered 2013-06-08: 40 mg via ORAL

## 2013-06-07 MED ORDER — FENTANYL CITRATE 0.05 MG/ML IJ SOLN
INTRAMUSCULAR | Status: DC | PRN
  Administered 2013-06-07 (×2): 50 ug via INTRAVENOUS
  Administered 2013-06-07: 100 ug via INTRAVENOUS
  Administered 2013-06-07 (×5): 50 ug via INTRAVENOUS

## 2013-06-07 MED ORDER — ONDANSETRON HCL 4 MG/2ML IJ SOLN: 4.0000 mg | INTRAMUSCULAR | Status: DC | PRN

## 2013-06-07 MED ORDER — OXYCODONE-ACETAMINOPHEN 5-325 MG PO TABS
1.0000 | ORAL_TABLET | ORAL | Status: DC | PRN
  Administered 2013-06-07 – 2013-06-08 (×4): 2 via ORAL
  Filled 2013-06-07 (×4): qty 2

## 2013-06-07 MED ORDER — PROMETHAZINE HCL 25 MG/ML IJ SOLN: 6.2500 mg | INTRAMUSCULAR | Status: DC | PRN

## 2013-06-07 MED ORDER — DEXAMETHASONE SODIUM PHOSPHATE 4 MG/ML IJ SOLN
INTRAMUSCULAR | Status: DC | PRN
  Administered 2013-06-07: 10 mg via INTRAVENOUS

## 2013-06-07 MED ORDER — LIDOCAINE HCL (CARDIAC) 20 MG/ML IV SOLN
INTRAVENOUS | Status: DC | PRN
  Administered 2013-06-07: 100 mg via INTRAVENOUS

## 2013-06-07 MED ORDER — CEFAZOLIN SODIUM-DEXTROSE 2-3 GM-% IV SOLR
INTRAVENOUS | Status: AC
  Administered 2013-06-07: 2 g via INTRAVENOUS
  Filled 2013-06-07: qty 50

## 2013-06-07 MED ORDER — HEMOSTATIC AGENTS (NO CHARGE) OPTIME
TOPICAL | Status: DC | PRN
  Administered 2013-06-07: 1 via TOPICAL

## 2013-06-07 MED ORDER — SODIUM CHLORIDE 0.9 % IV SOLN
250.0000 mL | INTRAVENOUS | Status: DC
  Administered 2013-06-07: 250 mL via INTRAVENOUS

## 2013-06-07 MED ORDER — MENTHOL 3 MG MT LOZG
1.0000 | LOZENGE | OROMUCOSAL | Status: DC | PRN
  Administered 2013-06-07: 3 mg via ORAL
  Filled 2013-06-07: qty 9

## 2013-06-07 MED ORDER — SODIUM CHLORIDE 0.9 % IJ SOLN: 3.0000 mL | INTRAMUSCULAR | Status: DC | PRN

## 2013-06-07 MED ORDER — LISINOPRIL 10 MG PO TABS
10.0000 mg | ORAL_TABLET | Freq: Every day | ORAL | Status: DC
  Administered 2013-06-07 – 2013-06-08 (×2): 10 mg via ORAL
  Filled 2013-06-07 (×2): qty 1

## 2013-06-07 MED ORDER — ACETAMINOPHEN 325 MG PO TABS: 650.0000 mg | ORAL_TABLET | ORAL | Status: DC | PRN

## 2013-06-07 MED ORDER — HYDROMORPHONE HCL PF 1 MG/ML IJ SOLN
INTRAMUSCULAR | Status: AC
  Filled 2013-06-07: qty 1

## 2013-06-07 MED ORDER — PROPOFOL 10 MG/ML IV BOLUS
INTRAVENOUS | Status: DC | PRN
  Administered 2013-06-07: 150 mg via INTRAVENOUS
  Administered 2013-06-07: 50 mg via INTRAVENOUS

## 2013-06-07 MED ORDER — CITALOPRAM HYDROBROMIDE 40 MG PO TABS
40.0000 mg | ORAL_TABLET | Freq: Every day | ORAL | Status: DC
  Administered 2013-06-08: 40 mg via ORAL
  Filled 2013-06-07 (×2): qty 1

## 2013-06-07 MED ORDER — DEXAMETHASONE 4 MG PO TABS
4.0000 mg | ORAL_TABLET | Freq: Four times a day (QID) | ORAL | Status: DC
  Administered 2013-06-07 (×2): 4 mg via ORAL
  Filled 2013-06-07 (×4): qty 1

## 2013-06-07 MED ORDER — METFORMIN HCL ER 500 MG PO TB24
1000.0000 mg | ORAL_TABLET | Freq: Two times a day (BID) | ORAL | Status: DC
  Administered 2013-06-07 – 2013-06-08 (×2): 1000 mg via ORAL
  Filled 2013-06-07 (×4): qty 2

## 2013-06-07 MED ORDER — PHENYLEPHRINE HCL 10 MG/ML IJ SOLN
INTRAMUSCULAR | Status: DC | PRN
  Administered 2013-06-07 (×5): 40 ug via INTRAVENOUS

## 2013-06-07 MED ORDER — LACTATED RINGERS IV SOLN
INTRAVENOUS | Status: DC | PRN
  Administered 2013-06-07 (×2): via INTRAVENOUS

## 2013-06-07 MED ORDER — OXYCODONE HCL 5 MG PO TABS
5.0000 mg | ORAL_TABLET | Freq: Once | ORAL | Status: AC | PRN
  Administered 2013-06-07: 5 mg via ORAL

## 2013-06-07 MED ORDER — GABAPENTIN 600 MG PO TABS
600.0000 mg | ORAL_TABLET | Freq: Every day | ORAL | Status: DC
  Administered 2013-06-08: 600 mg via ORAL
  Filled 2013-06-07: qty 1

## 2013-06-07 MED ORDER — CYCLOBENZAPRINE HCL 10 MG PO TABS
10.0000 mg | ORAL_TABLET | Freq: Every day | ORAL | Status: DC
  Administered 2013-06-07: 10 mg via ORAL
  Filled 2013-06-07 (×2): qty 1

## 2013-06-07 MED ORDER — SODIUM CHLORIDE 0.9 % IJ SOLN
3.0000 mL | Freq: Two times a day (BID) | INTRAMUSCULAR | Status: DC
  Administered 2013-06-07 (×2): 3 mL via INTRAVENOUS

## 2013-06-07 MED ORDER — THROMBIN 5000 UNITS EX SOLR
OROMUCOSAL | Status: DC | PRN
  Administered 2013-06-07: 08:00:00 via TOPICAL

## 2013-06-07 MED ORDER — DEXAMETHASONE SODIUM PHOSPHATE 4 MG/ML IJ SOLN
4.0000 mg | Freq: Four times a day (QID) | INTRAMUSCULAR | Status: DC
  Administered 2013-06-07 – 2013-06-08 (×2): 4 mg via INTRAVENOUS
  Filled 2013-06-07 (×6): qty 1

## 2013-06-07 MED ORDER — VITAMIN E 180 MG (400 UNIT) PO CAPS
800.0000 [IU] | ORAL_CAPSULE | Freq: Every day | ORAL | Status: DC
  Administered 2013-06-07 – 2013-06-08 (×2): 800 [IU] via ORAL
  Filled 2013-06-07 (×2): qty 2

## 2013-06-07 MED ORDER — GABAPENTIN 300 MG PO CAPS
900.0000 mg | ORAL_CAPSULE | Freq: Every day | ORAL | Status: DC
  Administered 2013-06-07: 900 mg via ORAL
  Filled 2013-06-07 (×2): qty 3

## 2013-06-07 SURGICAL SUPPLY — 85 items
ADH SKN CLS APL DERMABOND .7 (GAUZE/BANDAGES/DRESSINGS)
ADH SKN CLS LQ APL DERMABOND (GAUZE/BANDAGES/DRESSINGS) ×1
APL SKNCLS STERI-STRIP NONHPOA (GAUZE/BANDAGES/DRESSINGS) ×1
BAG DECANTER FOR FLEXI CONT (MISCELLANEOUS) ×2 IMPLANT
BENZOIN TINCTURE PRP APPL 2/3 (GAUZE/BANDAGES/DRESSINGS) ×2 IMPLANT
BIT DRILL SM SPINE QC 12 (BIT) ×1 IMPLANT
BONE CERV LORDOTIC 14.5X12X10 (Bone Implant) ×2 IMPLANT
BRUSH SCRUB EZ PLAIN DRY (MISCELLANEOUS) ×2 IMPLANT
BUR MATCHSTICK NEURO 3.0 LAGG (BURR) ×2 IMPLANT
CANISTER SUCT 3000ML (MISCELLANEOUS) ×2 IMPLANT
CONT SPEC 4OZ CLIKSEAL STRL BL (MISCELLANEOUS) ×2 IMPLANT
DECANTER SPIKE VIAL GLASS SM (MISCELLANEOUS) ×2 IMPLANT
DERMABOND ADHESIVE PROPEN (GAUZE/BANDAGES/DRESSINGS) ×1
DERMABOND ADVANCED (GAUZE/BANDAGES/DRESSINGS)
DERMABOND ADVANCED .7 DNX12 (GAUZE/BANDAGES/DRESSINGS) IMPLANT
DERMABOND ADVANCED .7 DNX6 (GAUZE/BANDAGES/DRESSINGS) IMPLANT
DRAIN CHANNEL 7F 3/4 FLAT (WOUND CARE) ×1 IMPLANT
DRAPE C-ARM 42X72 X-RAY (DRAPES) ×4 IMPLANT
DRAPE LAPAROTOMY 100X72 PEDS (DRAPES) ×2 IMPLANT
DRAPE MICROSCOPE ZEISS OPMI (DRAPES) ×2 IMPLANT
DRAPE POUCH INSTRU U-SHP 10X18 (DRAPES) ×2 IMPLANT
DRSG OPSITE 4X5.5 SM (GAUZE/BANDAGES/DRESSINGS) ×2 IMPLANT
DRSG OPSITE POSTOP 4X6 (GAUZE/BANDAGES/DRESSINGS) ×1 IMPLANT
DURAPREP 6ML APPLICATOR 50/CS (WOUND CARE) ×2 IMPLANT
ELECT COATED BLADE 2.86 ST (ELECTRODE) ×2 IMPLANT
ELECT REM PT RETURN 9FT ADLT (ELECTROSURGICAL) ×2
ELECTRODE REM PT RTRN 9FT ADLT (ELECTROSURGICAL) ×1 IMPLANT
EVACUATOR SILICONE 100CC (DRAIN) ×1 IMPLANT
GAUZE SPONGE 4X4 16PLY XRAY LF (GAUZE/BANDAGES/DRESSINGS) IMPLANT
GLOVE BIO SURGEON STRL SZ 6.5 (GLOVE) IMPLANT
GLOVE BIO SURGEON STRL SZ7 (GLOVE) IMPLANT
GLOVE BIO SURGEON STRL SZ7.5 (GLOVE) IMPLANT
GLOVE BIO SURGEON STRL SZ8 (GLOVE) ×2 IMPLANT
GLOVE BIO SURGEON STRL SZ8.5 (GLOVE) ×1 IMPLANT
GLOVE BIOGEL M 8.0 STRL (GLOVE) IMPLANT
GLOVE ECLIPSE 6.5 STRL STRAW (GLOVE) IMPLANT
GLOVE ECLIPSE 7.0 STRL STRAW (GLOVE) ×1 IMPLANT
GLOVE ECLIPSE 7.5 STRL STRAW (GLOVE) IMPLANT
GLOVE ECLIPSE 8.0 STRL XLNG CF (GLOVE) IMPLANT
GLOVE ECLIPSE 8.5 STRL (GLOVE) IMPLANT
GLOVE EXAM NITRILE LRG STRL (GLOVE) IMPLANT
GLOVE EXAM NITRILE MD LF STRL (GLOVE) IMPLANT
GLOVE EXAM NITRILE XL STR (GLOVE) IMPLANT
GLOVE EXAM NITRILE XS STR PU (GLOVE) IMPLANT
GLOVE INDICATOR 6.5 STRL GRN (GLOVE) IMPLANT
GLOVE INDICATOR 7.0 STRL GRN (GLOVE) ×3 IMPLANT
GLOVE INDICATOR 7.5 STRL GRN (GLOVE) ×1 IMPLANT
GLOVE INDICATOR 8.0 STRL GRN (GLOVE) IMPLANT
GLOVE INDICATOR 8.5 STRL (GLOVE) ×2 IMPLANT
GLOVE OPTIFIT SS 6.5 STRL BRWN (GLOVE) ×3 IMPLANT
GLOVE OPTIFIT SS 8.0 STRL (GLOVE) ×1 IMPLANT
GLOVE SURG SS PI 6.5 STRL IVOR (GLOVE) IMPLANT
GOWN BRE IMP SLV AUR LG STRL (GOWN DISPOSABLE) ×4 IMPLANT
GOWN BRE IMP SLV AUR XL STRL (GOWN DISPOSABLE) ×2 IMPLANT
GOWN STRL REIN 2XL LVL4 (GOWN DISPOSABLE) ×1 IMPLANT
GRAFT BNE SPCR VG2 14.5X12X10 (Bone Implant) IMPLANT
HEAD HALTER (SOFTGOODS) ×2 IMPLANT
HEMOSTAT POWDER KIT SURGIFOAM (HEMOSTASIS) IMPLANT
KIT BASIN OR (CUSTOM PROCEDURE TRAY) ×2 IMPLANT
KIT ROOM TURNOVER OR (KITS) ×2 IMPLANT
NDL HYPO 18GX1.5 BLUNT FILL (NEEDLE) ×1 IMPLANT
NDL SPNL 20GX3.5 QUINCKE YW (NEEDLE) ×1 IMPLANT
NEEDLE HYPO 18GX1.5 BLUNT FILL (NEEDLE) ×2 IMPLANT
NEEDLE SPNL 20GX3.5 QUINCKE YW (NEEDLE) ×2 IMPLANT
NS IRRIG 1000ML POUR BTL (IV SOLUTION) ×2 IMPLANT
PACK LAMINECTOMY NEURO (CUSTOM PROCEDURE TRAY) ×2 IMPLANT
PAD ARMBOARD 7.5X6 YLW CONV (MISCELLANEOUS) ×6 IMPLANT
PIN DISTRACTION 14MM (PIN) ×2 IMPLANT
PLATE ANT CERV XTEND 1 LV 18 (Plate) ×1 IMPLANT
RUBBERBAND STERILE (MISCELLANEOUS) ×4 IMPLANT
SCREW XTD VAR 4.2 SELF TAP (Screw) ×2 IMPLANT
SCREW XTEND SELFTAP VAR 4.6X14 (Screw) ×2 IMPLANT
SPONGE GAUZE 4X4 12PLY (GAUZE/BANDAGES/DRESSINGS) ×2 IMPLANT
SPONGE INTESTINAL PEANUT (DISPOSABLE) ×3 IMPLANT
SPONGE SURGIFOAM ABS GEL SZ50 (HEMOSTASIS) IMPLANT
STRIP CLOSURE SKIN 1/2X4 (GAUZE/BANDAGES/DRESSINGS) ×2 IMPLANT
SUT VIC AB 3-0 SH 8-18 (SUTURE) ×2 IMPLANT
SUT VICRYL 4-0 PS2 18IN ABS (SUTURE) ×2 IMPLANT
SYR 20ML ECCENTRIC (SYRINGE) ×2 IMPLANT
TAPE CLOTH 4X10 WHT NS (GAUZE/BANDAGES/DRESSINGS) IMPLANT
TAPE STRIPS DRAPE STRL (GAUZE/BANDAGES/DRESSINGS) ×1 IMPLANT
TOWEL OR 17X24 6PK STRL BLUE (TOWEL DISPOSABLE) ×2 IMPLANT
TOWEL OR 17X26 10 PK STRL BLUE (TOWEL DISPOSABLE) ×2 IMPLANT
TRAP SPECIMEN MUCOUS 40CC (MISCELLANEOUS) ×2 IMPLANT
WATER STERILE IRR 1000ML POUR (IV SOLUTION) ×2 IMPLANT

## 2013-06-07 NOTE — Anesthesia Postprocedure Evaluation (Signed)
Anesthesia Post Note  Patient: Sara Cross  Procedure(s) Performed: Procedure(s) (LRB): ANTERIOR CERVICAL DECOMPRESSION/DISCECTOMY FUSION 1 LEVEL/HARDWARE REMOVAL (N/A)  Anesthesia type: general  Patient location: PACU  Post pain: Pain level controlled  Post assessment: Patient's Cardiovascular Status Stable  Last Vitals:  Filed Vitals:   06/07/13 1121  BP: 125/62  Pulse: 88  Temp:   Resp: 12    Post vital signs: Reviewed and stable  Level of consciousness: sedated  Complications: No apparent anesthesia complications

## 2013-06-07 NOTE — Preoperative (Signed)
Beta Blockers   Reason not to administer Beta Blockers:Not Applicable 

## 2013-06-07 NOTE — Plan of Care (Signed)
Problem: Consults Goal: Diagnosis - Spinal Surgery Outcome: Completed/Met Date Met:  06/07/13 Cervical Spine Fusion     

## 2013-06-07 NOTE — Op Note (Signed)
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  EBL: Minimal  History of present illness: This is a very pleasant 57 year old female who suffers worsening neck and bilateral pain in both arms worse on the left with numbness tingling weakness in her hands. She also had some difficulty walking. Workup was consistent with the early signs of myelopathy and imaging revealed a large disc herniation with severe spinal cord compression at C6-7 with a fragment migrating behind the C7 vertebral body. This is below level the previous fusion C4-C6. Due to patient's clinical exam progression of clinical syndrome and imaging findings and failure conservative treatment I recommended anterior cervical discectomy and fusion C6-7 with exploration of fusion removal of hardware C4 and C6. I extensively reviewed the risks and benefits of the operation the patient as well as perioperative course and expectations of outcome and alternatives surgery and she understood and agreed to proceed forward.  Operative procedure: Patient brought into the or was induced under general anesthesia positioned supine the neck in slight extension in 5 pounds of halter traction the right side of her neck was prepped And draped in routine sterile fashion. Her old incision was opened up and the superficial layer of present dissection scar tissues dissected free and the platysmas divided longitudinally. The avascular and 2 between the sternomastoid and strap muscles was developed and the previously fashioned professes acid with Kitners. The plate was immediately identified and dissected free  the screws removed and the plate was removed and the fusion appeared to be solid the C6-7 disc spaces identified and dissected free and the self-retaining retractor was placed. Caspar distraction pins were inserted the disc space was incised cleanout with BA curette pituitary rongeurs and a high-speed drill was used to drill down the disc space to the posterior annulus and a 6 complex. Under microscopic illumination both endplates progressively under bitten especially taking care to remove a fairly significant portion of the posterior aspect of the endplate of the superior endplate of C7 and a large free fragment disc was immediately appreciated this was teased with I nerve hook several arthritis removed the central compartment and a working laterally the both C7 nerve was identified both C7 pedicles were identified and the dura was identified proximally nerve root and then the layer underneath the posterior last ligament where additional fragments were appreciated was teased away removed in piecemeal fashion decompress the central canal. Then aggressively under biting working up underneath the C7 vertebral body several more fragments were fished out from behind the C7 vertebral body the ligament was a markedly hypertrophied this was removed in piecemeal fashion until there was no further stenosis and thecal sac to resume its normal anatomic position. After adequate decompression achieved both centrally and foraminally and the mortise was appreciated was complete her get meticulous hemostasis was maintained the endplates were scraped and a size 10 mm allograft wedge which actually measured at 8.75 x 10.22 was inserted this had good apposition the endplates a single level plate was then placed to rescue screws were inserted at C6 and a 2 mm plate extended down to new screw holes were drilled and the 2 new screws excellent purchase at C7.  Locking mechanisms were engaged the wounds and copiously irrigated meticulous  hemostasis was maintained a drain was placed and the wounds closed in layers with after Vicryl and the platysma and a running 4 subcuticular benzo and Steri-Strips were applied patient recovered in stable condition. At the end of case on it counts sponge counts were correct.

## 2013-06-07 NOTE — Addendum Note (Signed)
Addendum created 06/07/13 1257 by Hessie Dibble, CRNA   Modules edited: Anesthesia Flowsheet

## 2013-06-07 NOTE — Progress Notes (Signed)
Postop Check  Pt doing well, without significant c/o. She has been walking and reports her legs "feel better." Has eaten some soup with minimal difficulty.  She says she had some pain in her arms initially after surgery but nothing now. No other new N/T/W.  EXAM:  BP 121/65  Pulse 75  Temp(Src) 97.4 F (36.3 C) (Oral)  Resp 18  SpO2 97%  Awake, alert, oriented  Speech fluent, appropriate  CN grossly intact  5/5 BUE/BLE  Drain in place with serosanguinous drainage  IMPRESSION:  57 y.o. female s/p ACDF C6-7, neurologically well  PLAN: - Routine postop care - Cont to mobilize - Monitor drain output

## 2013-06-07 NOTE — H&P (Signed)
Sara Cross is an 57 y.o. female.   Chief Complaint: Neck and left greater right arm pain HPI: 57 year old female with progressive worsening neck and prominent left arm pain rating to her tricep in her fingers with numbness tingling and weakness in her hand. She started experiencing a little bit of right shoulder and upper arm pain recently. Workup revealed a very large disc herniation C6-7 below the level of her previous fusion C4-C6. Preoperative workup a CT scan showed what appeared be solid fusions at C4-5 and C5-6 and due to the large disc herniation degree spinal cord compression progression of clinical syndrome and failed conservative treatment I recommended an anterior cervical discectomy and fusion C6-7 with exploration of fusion removal of hardware C4-C6. I extensively reviewed the risks and benefits of the operation with the patient as well as perioperative course and expectations of outcome alternatives surgery and she understood and agreed to proceed forward.  Past Medical History  Diagnosis Date  . Hypertension   . Depression   . Pneumonia     hx of  . Diabetes mellitus     takes metformin daily  . Arthritis   . Hyperlipidemia   . Morbid obesity   . Anxiety   . Sleep apnea     wear cpap nightly  . GERD (gastroesophageal reflux disease)   . Headache(784.0)   . Dizziness     Past Surgical History  Procedure Laterality Date  . Back surgery      01/2011  . Hand surgery      right   2011    FOR INJURY   . Cesearean      X2  . Toenail excision      BIG TOE RIGHT FOOT  . Toe nail removal      right great toe  . Abdominal hysterectomy  2004  . Colonoscopy    . Anterior cervical decomp/discectomy fusion  12/23/2011    Procedure: ANTERIOR CERVICAL DECOMPRESSION/DISCECTOMY FUSION 2 LEVELS;  Surgeon: Mariam Dollar, MD;  Location: [facility];  Service: Neurosurgery;  Laterality: N/A;  Anterior Cervical Four-Five/Five-Six Decompression and Fusion  . Carpal tunnel release   12/23/2011    Procedure: CARPAL TUNNEL RELEASE;  Surgeon: Mariam Dollar, MD;  Location: [facility];  Service: Neurosurgery;  Laterality: Left;  Left Carpal Tunnel Release    Family History  Problem Relation Age of Onset  . Anesthesia problems Neg Hx   . Hypotension Neg Hx   . Malignant hyperthermia Neg Hx   . Pseudochol deficiency Neg Hx   . Cancer Father   . Hypertension Other   . Hyperlipidemia Other   . Cancer Other   . Sleep apnea Other   . Obesity Other    Social History:  reports that she quit smoking about 16 months ago. Her smoking use included Cigarettes. She has a 7.5 pack-year smoking history. She does not have any smokeless tobacco history on file. She reports that she drinks alcohol. She reports that she does not use illicit drugs.  Allergies: No Known Allergies  Medications Prior to Admission  Medication Sig Dispense Refill  . B Complex-C (SUPER B COMPLEX PO) Take 1 tablet by mouth daily.      . Cholecalciferol (VITAMIN D PO) Take 1 capsule by mouth daily.      . citalopram (CELEXA) 40 MG tablet Take 40 mg by mouth daily.      . cyclobenzaprine (FLEXERIL) 10 MG tablet Take 10 mg by mouth at  bedtime.       . furosemide (LASIX) 20 MG tablet Take 40 mg by mouth daily.      Marland Kitchen gabapentin (NEURONTIN) 300 MG capsule Take 600-900 mg by mouth 2 (two) times daily. Takes 2 capsules every morning and takes 3 capsules every night at bedtime.      Marland Kitchen lisinopril (PRINIVIL,ZESTRIL) 20 MG tablet Take 10 mg by mouth daily.       Marland Kitchen MAGNESIUM PO Take 250 mg by mouth daily.      . metFORMIN (GLUCOPHAGE-XR) 500 MG 24 hr tablet Take 1,000 mg by mouth 2 (two) times daily.      Marland Kitchen omeprazole (PRILOSEC) 20 MG capsule Take 20 mg by mouth daily.      Marland Kitchen oxyCODONE-acetaminophen (PERCOCET/ROXICET) 5-325 MG per tablet Take 1 tablet by mouth 2 (two) times daily as needed for pain.      . vitamin E (VITAMIN E) 400 UNIT capsule Take 800 Units by mouth daily.        Results for orders placed during the  hospital encounter of 06/07/13 (from the past 48 hour(s))  GLUCOSE, CAPILLARY     Status: Abnormal   Collection Time    06/07/13  6:23 AM      Result Value Range   Glucose-Capillary 152 (*) 70 - 99 mg/dL   Ct Cervical Spine Wo Contrast  06/06/2013   CLINICAL DATA:  Neck and bilateral arm pain. History of prior surgery.  EXAM: CT CERVICAL SPINE WITHOUT CONTRAST  TECHNIQUE: Multidetector CT imaging of the cervical spine was performed without intravenous contrast. Multiplanar CT image reconstructions were also generated.  COMPARISON:  MR cervical spine 05/20/2013.  FINDINGS: Vertebral body height and alignment are maintained. The patient is status post C4-6 fusion. The hardware is well positioned and there is bridging bone across both disc interspaces. No lucency about the hardware to suggest loosening is identified. Paraspinous structures are unremarkable. Lung apices are clear.  C2-3: Negative.  C3-4: Shallow disc bulge appears to cause mild central canal narrowing and slight deformity of the ventral cord. Neural foramina appear open.  C4-5: Status post discectomy and fusion. The central canal and foramina appear widely patent.  C5-6: Status post discectomy and fusion. The central canal is widely patent. There is some uncovertebral disease causing mild right foraminal narrowing. The left foramen is open.  C6-7: Large central disc protrusion is identified but better visualized on MRI. The disc appears to flatten the cord as seen on the prior MRI. Neural foramina appear patent.  C7-T1: Negative.  IMPRESSION: Large disc extrusion at C6-7 deforms the cord. The disc is better seen on the prior MRI.  Shallow disc protrusion at C3-4 appears to contact and slightly deform the cord.  Status post C4-6 ACDF. Hardware is intact and there is solid bridging bone across the disc interspaces. The central canal is widely patent at each level.   Electronically Signed   By: Drusilla Kanner M.D.   On: 06/06/2013 14:17     Review of Systems  Constitutional: Negative.   Eyes: Negative.   Respiratory: Negative.   Cardiovascular: Negative.   Gastrointestinal: Negative.   Genitourinary: Negative.   Musculoskeletal: Positive for joint pain, myalgias and neck pain.  Neurological: Positive for tremors and sensory change.  Endo/Heme/Allergies: Negative.   Psychiatric/Behavioral: Negative.     Blood pressure 129/72, pulse 80, temperature 98.6 F (37 C), temperature source Oral, resp. rate 20, SpO2 97.00%. Physical Exam  Constitutional: She is oriented to person, place, and  time. She appears well-developed and well-nourished.  HENT:  Head: Normocephalic.  Eyes: Pupils are equal, round, and reactive to light.  Neck: Normal range of motion.  Cardiovascular: Normal rate.   Respiratory: Effort normal.  GI: Soft.  Musculoskeletal: Normal range of motion.  Neurological: She is alert and oriented to person, place, and time. GCS eye subscore is 4. GCS verbal subscore is 5. GCS motor subscore is 6.  Reflex Scores:      Tricep reflexes are 2+ on the right side.      Bicep reflexes are 2+ on the right side.      Brachioradialis reflexes are 2+ on the right side.      Patellar reflexes are 2+ on the right side.      Achilles reflexes are 2+ on the right side. Neck has full range of motion strength is 5 out of 5 in her deltoids and biceps bilaterally left-sided tricep is slightly weak at 4+ out of 5 left sided hand intrinsics are also slightly weak at 4+ out of 5 otherwise she is 5 out of 5 in the right upper extremity appears to be 5 out of 5  Skin: Skin is warm and dry.     Assessment/Plan Patient very pleasant 57 year female presents for an ACDF at C6-7 with an exploration of fusion removal of hardware C4-C6  Jacquelyn Antony P 06/07/2013, 7:16 AM

## 2013-06-07 NOTE — Anesthesia Preprocedure Evaluation (Addendum)
Anesthesia Evaluation  Patient identified by MRN, date of birth, ID band Patient awake    Reviewed: Allergy & Precautions, H&P , NPO status , Patient's Chart, lab work & pertinent test results  History of Anesthesia Complications Negative for: history of anesthetic complications  Airway Mallampati: II TM Distance: >3 FB Neck ROM: Full    Dental  (+) Teeth Intact, Dental Advisory Given and Chipped   Pulmonary sleep apnea and Continuous Positive Airway Pressure Ventilation ,    Pulmonary exam normal       Cardiovascular hypertension, Pt. on medications     Neuro/Psych  Headaches, PSYCHIATRIC DISORDERS Anxiety Depression    GI/Hepatic Neg liver ROS, GERD-  Medicated,  Endo/Other  diabetes, Oral Hypoglycemic AgentsMorbid obesity  Renal/GU negative Renal ROS     Musculoskeletal   Abdominal   Peds  Hematology negative hematology ROS (+)   Anesthesia Other Findings   Reproductive/Obstetrics                          Anesthesia Physical Anesthesia Plan  ASA: III  Anesthesia Plan: General   Post-op Pain Management:    Induction: Intravenous  Airway Management Planned: Oral ETT  Additional Equipment:   Intra-op Plan:   Post-operative Plan: Extubation in OR  Informed Consent: I have reviewed the patients History and Physical, chart, labs and discussed the procedure including the risks, benefits and alternatives for the proposed anesthesia with the patient or authorized representative who has indicated his/her understanding and acceptance.   Dental advisory given  Plan Discussed with: CRNA, Anesthesiologist and Surgeon  Anesthesia Plan Comments:       Anesthesia Quick Evaluation

## 2013-06-07 NOTE — Transfer of Care (Signed)
Immediate Anesthesia Transfer of Care Note  Patient: Sara Cross  Procedure(s) Performed: Procedure(s) with comments: ANTERIOR CERVICAL DECOMPRESSION/DISCECTOMY FUSION 1 LEVEL/HARDWARE REMOVAL (N/A) - ANTERIOR CERVICAL DECOMPRESSION/DISCECTOMY FUSION 1 LEVEL/HARDWARE REMOVAL  Patient Location: PACU  Anesthesia Type:General  Level of Consciousness: patient cooperative and responds to stimulation  Airway & Oxygen Therapy: Patient Spontanous Breathing and Patient connected to face mask oxygen  Post-op Assessment: Report given to PACU RN and Post -op Vital signs reviewed and stable  Post vital signs: Reviewed and stable  Complications: No apparent anesthesia complications

## 2013-06-08 MED ORDER — CYCLOBENZAPRINE HCL 10 MG PO TABS: 10.0000 mg | ORAL_TABLET | Freq: Every day | ORAL | Status: DC

## 2013-06-08 MED ORDER — OXYCODONE-ACETAMINOPHEN 5-325 MG PO TABS: 1.0000 | ORAL_TABLET | Freq: Four times a day (QID) | ORAL | Status: DC | PRN

## 2013-06-08 NOTE — Discharge Summary (Signed)
   Physician Discharge Summary  Patient ID: [name] MRN: [id number] DOB/AGE: 10/30/1955 57 y.o.  Admit date: 06/07/2013 Discharge date: 06/08/2013  Admission Diagnoses: Cervical spondylosis, C6-7  Discharge Diagnoses: Same Active Problems:   * No active hospital problems. *   Discharged Condition: Stable  Hospital Course:  Mrs. Sara Cross is a 57 y.o. female was electively admitted after anterior cervical discectomy at C6-7. The patient underwent the procedure without complication and was returned to the spine care floor. Initially postoperatively she was found to be at her neurologic baseline, with good strength in the upper extremities. She reported significant improvement in her preoperative arm pain. She was ambulating without difficulty, tolerating diet, voiding normally, with pain under control and therefore deemed stable for discharge on postoperative day #1. Her drain had minimal output and was discontinued.  Treatments: Surgery - anterior cervical discectomy C6-7  Discharge Exam: Blood pressure 127/73, pulse 81, temperature 99.1 F (37.3 C), temperature source Oral, resp. rate 18, SpO2 94.00%. Awake, alert, oriented Speech fluent, appropriate CN grossly intact 5/5 BUE/BLE Wound c/d/i  Follow-up: Follow-up in Dr. Lonie Peak office [facility] [phone]) in 2-3 weeks  Disposition: 01-Home or Self Care     Medication List         citalopram 40 MG tablet  Commonly known as:  CELEXA  Take 40 mg by mouth daily.     cyclobenzaprine 10 MG tablet  Commonly known as:  FLEXERIL  Take 1 tablet (10 mg total) by mouth at bedtime.     furosemide 20 MG tablet  Commonly known as:  LASIX  Take 40 mg by mouth daily.     gabapentin 300 MG capsule  Commonly known as:  NEURONTIN  Take 600-900 mg by mouth 2 (two) times daily. Takes 2 capsules every morning and takes 3 capsules every night at bedtime.     lisinopril 20 MG tablet  Commonly known as:   PRINIVIL,ZESTRIL  Take 10 mg by mouth daily.     MAGNESIUM PO  Take 250 mg by mouth daily.     metFORMIN 500 MG 24 hr tablet  Commonly known as:  GLUCOPHAGE-XR  Take 1,000 mg by mouth 2 (two) times daily.     omeprazole 20 MG capsule  Commonly known as:  PRILOSEC  Take 20 mg by mouth daily.     oxyCODONE-acetaminophen 5-325 MG per tablet  Commonly known as:  PERCOCET/ROXICET  Take 1 tablet by mouth every 6 (six) hours as needed for pain.     SUPER B COMPLEX PO  Take 1 tablet by mouth daily.     VITAMIN D PO  Take 1 capsule by mouth daily.     vitamin E 400 UNIT capsule  Generic drug:  vitamin E  Take 800 Units by mouth daily.         SignedLisbeth Renshaw, C 06/08/2013, 10:58 AM

## 2013-06-08 NOTE — Progress Notes (Signed)
Pt. discharged home accompanied by husband. Prescriptions and discharge instructions given with verbalization of understanding. Incision site on neck with no s/s of infection - no swelling, redness, bleeding, and/or drainage noted. Soft collar intact. Opportunity given to ask questions but no question asked. Pt. transported out of this unit in wheelchair by the volunteer   

## 2013-06-10 ENCOUNTER — Encounter (HOSPITAL_COMMUNITY): Payer: Self-pay | Admitting: Neurosurgery

## 2013-06-14 ENCOUNTER — Other Ambulatory Visit (HOSPITAL_COMMUNITY): Payer: Self-pay | Admitting: Neurosurgery

## 2013-06-14 ENCOUNTER — Ambulatory Visit (HOSPITAL_COMMUNITY)
Admission: RE | Admit: 2013-06-14 | Discharge: 2013-06-14 | Disposition: A | Discharge status: Home / Self Care | Payer: Medicare Other | Source: Ambulatory Visit | Attending: Vascular Surgery | Admitting: Vascular Surgery

## 2013-06-14 DIAGNOSIS — M79609 Pain in unspecified limb: Secondary | ICD-10-CM

## 2013-07-04 ENCOUNTER — Other Ambulatory Visit: Payer: Self-pay | Admitting: Neurosurgery

## 2013-07-04 DIAGNOSIS — M4712 Other spondylosis with myelopathy, cervical region: Secondary | ICD-10-CM

## 2013-07-19 ENCOUNTER — Ambulatory Visit
Admission: RE | Admit: 2013-07-19 | Discharge: 2013-07-19 | Disposition: A | Discharge status: Home / Self Care | Payer: Medicare Other | Source: Ambulatory Visit | Attending: Neurosurgery | Admitting: Neurosurgery

## 2013-07-19 DIAGNOSIS — M4712 Other spondylosis with myelopathy, cervical region: Secondary | ICD-10-CM

## 2013-07-19 MED ORDER — GADOBENATE DIMEGLUMINE 529 MG/ML IV SOLN
19.0000 mL | Freq: Once | INTRAVENOUS | Status: AC | PRN
  Administered 2013-07-19: 19 mL via INTRAVENOUS

## 2013-07-24 ENCOUNTER — Other Ambulatory Visit: Payer: Self-pay | Admitting: Physician Assistant

## 2013-08-11 ENCOUNTER — Encounter: Payer: Self-pay | Admitting: Internal Medicine

## 2013-08-11 DIAGNOSIS — G4733 Obstructive sleep apnea (adult) (pediatric): Secondary | ICD-10-CM | POA: Insufficient documentation

## 2013-08-11 DIAGNOSIS — F32A Depression, unspecified: Secondary | ICD-10-CM | POA: Insufficient documentation

## 2013-08-11 DIAGNOSIS — F419 Anxiety disorder, unspecified: Secondary | ICD-10-CM | POA: Insufficient documentation

## 2013-08-11 DIAGNOSIS — IMO0002 Reserved for concepts with insufficient information to code with codable children: Secondary | ICD-10-CM | POA: Insufficient documentation

## 2013-08-11 DIAGNOSIS — F329 Major depressive disorder, single episode, unspecified: Secondary | ICD-10-CM | POA: Insufficient documentation

## 2013-08-11 DIAGNOSIS — K219 Gastro-esophageal reflux disease without esophagitis: Secondary | ICD-10-CM | POA: Insufficient documentation

## 2013-08-19 ENCOUNTER — Encounter: Payer: Self-pay | Admitting: Emergency Medicine

## 2013-08-19 ENCOUNTER — Ambulatory Visit (INDEPENDENT_AMBULATORY_CARE_PROVIDER_SITE_OTHER): Payer: Medicare Other | Admitting: Emergency Medicine

## 2013-08-19 VITALS — BP 122/80 | HR 78 | Temp 97.8°F | Resp 18 | Ht 62.0 in | Wt 208.0 lb

## 2013-08-19 DIAGNOSIS — E782 Mixed hyperlipidemia: Secondary | ICD-10-CM

## 2013-08-19 DIAGNOSIS — I10 Essential (primary) hypertension: Secondary | ICD-10-CM

## 2013-08-19 DIAGNOSIS — E119 Type 2 diabetes mellitus without complications: Secondary | ICD-10-CM

## 2013-08-19 DIAGNOSIS — Z79899 Other long term (current) drug therapy: Secondary | ICD-10-CM

## 2013-08-19 DIAGNOSIS — R5381 Other malaise: Secondary | ICD-10-CM

## 2013-08-19 DIAGNOSIS — R5383 Other fatigue: Secondary | ICD-10-CM

## 2013-08-19 DIAGNOSIS — J309 Allergic rhinitis, unspecified: Secondary | ICD-10-CM

## 2013-08-19 DIAGNOSIS — D649 Anemia, unspecified: Secondary | ICD-10-CM

## 2013-08-19 DIAGNOSIS — E559 Vitamin D deficiency, unspecified: Secondary | ICD-10-CM

## 2013-08-19 DIAGNOSIS — E538 Deficiency of other specified B group vitamins: Secondary | ICD-10-CM

## 2013-08-19 LAB — CBC WITH DIFFERENTIAL/PLATELET
BASOS ABS: 0 10*3/uL (ref 0.0–0.1)
Basophils Relative: 1 % (ref 0–1)
EOS ABS: 0.3 10*3/uL (ref 0.0–0.7)
Eosinophils Relative: 4 % (ref 0–5)
HEMATOCRIT: 42 % (ref 36.0–46.0)
HEMOGLOBIN: 14.1 g/dL (ref 12.0–15.0)
Lymphocytes Relative: 27 % (ref 12–46)
Lymphs Abs: 1.8 10*3/uL (ref 0.7–4.0)
MCH: 29.8 pg (ref 26.0–34.0)
MCHC: 33.6 g/dL (ref 30.0–36.0)
MCV: 88.8 fL (ref 78.0–100.0)
MONO ABS: 0.6 10*3/uL (ref 0.1–1.0)
MONOS PCT: 9 % (ref 3–12)
NEUTROS ABS: 3.8 10*3/uL (ref 1.7–7.7)
NEUTROS PCT: 59 % (ref 43–77)
Platelets: 278 10*3/uL (ref 150–400)
RBC: 4.73 MIL/uL (ref 3.87–5.11)
RDW: 15.2 % (ref 11.5–15.5)
WBC: 6.5 10*3/uL (ref 4.0–10.5)

## 2013-08-19 LAB — BASIC METABOLIC PANEL WITH GFR
BUN: 13 mg/dL (ref 6–23)
CO2: 29 mEq/L (ref 19–32)
Calcium: 10.1 mg/dL (ref 8.4–10.5)
Chloride: 101 mEq/L (ref 96–112)
Creat: 0.67 mg/dL (ref 0.50–1.10)
GLUCOSE: 118 mg/dL — AB (ref 70–99)
POTASSIUM: 4.7 meq/L (ref 3.5–5.3)
SODIUM: 140 meq/L (ref 135–145)

## 2013-08-19 LAB — HEPATIC FUNCTION PANEL
ALK PHOS: 121 U/L — AB (ref 39–117)
ALT: 16 U/L (ref 0–35)
AST: 14 U/L (ref 0–37)
Albumin: 4.1 g/dL (ref 3.5–5.2)
BILIRUBIN TOTAL: 0.3 mg/dL (ref 0.3–1.2)
Total Protein: 7.1 g/dL (ref 6.0–8.3)

## 2013-08-19 LAB — LIPID PANEL
CHOL/HDL RATIO: 4.1 ratio
Cholesterol: 230 mg/dL — ABNORMAL HIGH (ref 0–200)
HDL: 56 mg/dL (ref >39)
Triglycerides: 465 mg/dL — ABNORMAL HIGH (ref <150)

## 2013-08-19 LAB — IRON AND TIBC
%SAT: 33 % (ref 20–55)
IRON: 114 ug/dL (ref 42–145)
TIBC: 350 ug/dL (ref 250–470)
UIBC: 236 ug/dL (ref 125–400)

## 2013-08-19 LAB — MAGNESIUM: Magnesium: 1.8 mg/dL (ref 1.5–2.5)

## 2013-08-19 LAB — HEMOGLOBIN A1C
HEMOGLOBIN A1C: 6.9 % — AB (ref <5.7)
MEAN PLASMA GLUCOSE: 151 mg/dL — AB (ref <117)

## 2013-08-19 NOTE — Patient Instructions (Signed)

## 2013-08-19 NOTE — Progress Notes (Signed)
Subjective:    Patient ID: Sara Cross, female    DOB: 04-Oct-1955, 58 y.o.   MRN: [id number]  HPI Comments: 58 yo female  presents for 3 month F/U for HTN, Cholesterol, DM, D. Deficient follow up LAST LABS BS 113 T 201 TG 281 L 95 A1C 6.5 D 83 MAG 1.8 She is not exercising with back surgery. She is not eating healthy.  She increased magnesium by 1 pill at last OV. She stays tired but also has to take lots of meds that increase fatigue with chronic back problems.  She has increased nasal drainage but appears to be clear with only mild congestion.  She notes difficulty focusing, finishing tasks, and starts many tasks without finishing. She notes difficulty remembering where she puts things. She notes stress and fatigue. She denies any previous RX for ADD medicines but notes used to be on B12  Hypertension Associated symptoms include anxiety.  Hyperlipidemia  Anxiety Symptoms include confusion and decreased concentration.     Current Outpatient Prescriptions on File Prior to Visit  Medication Sig Dispense Refill  . B Complex-C (SUPER B COMPLEX PO) Take 1 tablet by mouth daily.      . Cholecalciferol (VITAMIN D PO) Take 1 capsule by mouth daily.      . citalopram (CELEXA) 40 MG tablet Take 40 mg by mouth daily.      . cyclobenzaprine (FLEXERIL) 10 MG tablet Take 1 tablet (10 mg total) by mouth at bedtime.  30 tablet  0  . furosemide (LASIX) 20 MG tablet Take 40 mg by mouth daily.      Marland Kitchen gabapentin (NEURONTIN) 300 MG capsule Take 600-900 mg by mouth 2 (two) times daily. Takes 2 capsules every morning and takes 3 capsules every night at bedtime.      Marland Kitchen lisinopril (PRINIVIL,ZESTRIL) 20 MG tablet TAKE ONE TABLET BY MOUTH ONCE DAILY  90 tablet  0  . MAGNESIUM PO Take 250 mg by mouth daily.      . metFORMIN (GLUCOPHAGE-XR) 500 MG 24 hr tablet Take 1,000 mg by mouth 2 (two) times daily.      Marland Kitchen omeprazole (PRILOSEC) 20 MG capsule Take 20 mg by mouth daily.      Marland Kitchen oxyCODONE-acetaminophen  (PERCOCET/ROXICET) 5-325 MG per tablet Take 1 tablet by mouth every 6 (six) hours as needed for pain.  60 tablet  0  . vitamin E (VITAMIN E) 400 UNIT capsule Take 800 Units by mouth daily.       No current facility-administered medications on file prior to visit.   ALLERGIES Benicar; Lortab; and Ziac  Past Medical History  Diagnosis Date  . Hypertension   . Depression   . Pneumonia     hx of  . Diabetes mellitus     takes metformin daily  . Arthritis   . Hyperlipidemia   . Morbid obesity   . Anxiety   . Sleep apnea     wear cpap nightly  . GERD (gastroesophageal reflux disease)   . Headache(784.0)   . Dizziness   . DDD (degenerative disc disease)       Review of Systems  Constitutional: Positive for fatigue.  HENT: Positive for congestion, postnasal drip and sneezing.   Psychiatric/Behavioral: Positive for confusion and decreased concentration.  All other systems reviewed and are negative.   BP 122/80  Pulse 78  Temp(Src) 97.8 F (36.6 C) (Temporal)  Resp 18  Ht 5\' 2"  (1.575 m)  Wt 208 lb (94.348 kg)  BMI 38.03 kg/m2     Objective:   Physical Exam  Nursing note and vitals reviewed. Constitutional: She is oriented to person, place, and time. She appears well-developed and well-nourished. No distress.  HENT:  Head: Normocephalic and atraumatic.  Right Ear: External ear normal.  Left Ear: External ear normal.  Nose: Nose normal.  Mouth/Throat: Oropharynx is clear and moist. No oropharyngeal exudate.  Cloudy TMs  Eyes: Conjunctivae and EOM are normal.  Neck: Normal range of motion. Neck supple. No JVD present. No thyromegaly present.  Cardiovascular: Normal rate, regular rhythm, normal heart sounds and intact distal pulses.   Pulmonary/Chest: Effort normal and breath sounds normal.  Abdominal: Soft. Bowel sounds are normal. She exhibits no distension and no mass. There is no tenderness. There is no rebound and no guarding.  Musculoskeletal: Normal range of  motion. She exhibits no edema and no tenderness.  Lymphadenopathy:    She has no cervical adenopathy.  Neurological: She is alert and oriented to person, place, and time. No cranial nerve deficit.  Slow to respond to some questions  Skin: Skin is warm and dry. No rash noted. No erythema. No pallor.  Psychiatric: She has a normal mood and affect. Her behavior is normal. Judgment and thought content normal.          Assessment & Plan:  1.  3 month F/U for HTN, Cholesterol,DM, D. Deficient. Needs healthy diet, cardio QD and obtain healthy weight. Check Labs, Check BP if >130/80 call office, Check BS if >200 call office 2.Allergic rhinitis- Allegra OTC, increase H2o, allergy hygiene explained. 3. Anemia/ Fatigue- check labs, increase activity and H2O 4. ? ADD vs pain RX vs fatigue- check labs if neg consider RX Adderall vs CT HEAD if no improvement

## 2013-08-20 ENCOUNTER — Telehealth: Payer: Self-pay | Admitting: *Deleted

## 2013-08-20 LAB — TSH: TSH: 1.92 u[IU]/mL (ref 0.350–4.500)

## 2013-08-20 LAB — INSULIN, FASTING: Insulin fasting, serum: 26 u[IU]/mL (ref 3–28)

## 2013-08-20 LAB — VITAMIN B12: Vitamin B-12: 379 pg/mL (ref 211–911)

## 2013-08-20 NOTE — Telephone Encounter (Signed)
Message copied by Bradly Bienenstock A on Tue Aug 20, 2013 12:12 PM ------      Message from: [place], [place] R      Created: Tue Aug 20, 2013  6:55 AM       B12 low end normal add super b complex/ Magnesium low add 500 mg both help with aches/ pain/ energy. Triglycerides, Cholesterol, A1c elevated need better/ healthier diet, cardio and weight loss. ALK Phos mild elevationmay be from pain medicine or recent surgery, so no new RX for ADD at this time will need to recheck 1 month lab only HFP. Continue vitamins and prescriptions same oherwise.       ------

## 2013-08-20 NOTE — Telephone Encounter (Signed)
Spoke with patient about recent lab results and instructions.

## 2013-09-20 ENCOUNTER — Other Ambulatory Visit: Payer: Self-pay

## 2013-10-16 ENCOUNTER — Encounter: Payer: Self-pay | Admitting: Emergency Medicine

## 2013-10-25 ENCOUNTER — Ambulatory Visit (INDEPENDENT_AMBULATORY_CARE_PROVIDER_SITE_OTHER): Payer: Medicare Other | Admitting: Emergency Medicine

## 2013-10-25 ENCOUNTER — Encounter: Payer: Self-pay | Admitting: Emergency Medicine

## 2013-10-25 VITALS — BP 128/76 | HR 76 | Temp 98.6°F | Resp 18 | Ht 62.0 in | Wt 204.0 lb

## 2013-10-25 DIAGNOSIS — R6889 Other general symptoms and signs: Secondary | ICD-10-CM

## 2013-10-25 DIAGNOSIS — R350 Frequency of micturition: Secondary | ICD-10-CM

## 2013-10-25 DIAGNOSIS — E1159 Type 2 diabetes mellitus with other circulatory complications: Secondary | ICD-10-CM

## 2013-10-25 DIAGNOSIS — Z1212 Encounter for screening for malignant neoplasm of rectum: Secondary | ICD-10-CM

## 2013-10-25 DIAGNOSIS — Z780 Asymptomatic menopausal state: Secondary | ICD-10-CM

## 2013-10-25 DIAGNOSIS — E669 Obesity, unspecified: Secondary | ICD-10-CM

## 2013-10-25 DIAGNOSIS — R079 Chest pain, unspecified: Secondary | ICD-10-CM

## 2013-10-25 DIAGNOSIS — R5383 Other fatigue: Secondary | ICD-10-CM

## 2013-10-25 DIAGNOSIS — Z23 Encounter for immunization: Secondary | ICD-10-CM

## 2013-10-25 DIAGNOSIS — E782 Mixed hyperlipidemia: Secondary | ICD-10-CM

## 2013-10-25 DIAGNOSIS — E559 Vitamin D deficiency, unspecified: Secondary | ICD-10-CM

## 2013-10-25 DIAGNOSIS — Z0000 Encounter for general adult medical examination without abnormal findings: Secondary | ICD-10-CM

## 2013-10-25 DIAGNOSIS — Z111 Encounter for screening for respiratory tuberculosis: Secondary | ICD-10-CM

## 2013-10-25 DIAGNOSIS — R5381 Other malaise: Secondary | ICD-10-CM

## 2013-10-25 DIAGNOSIS — E538 Deficiency of other specified B group vitamins: Secondary | ICD-10-CM

## 2013-10-25 DIAGNOSIS — I10 Essential (primary) hypertension: Secondary | ICD-10-CM

## 2013-10-25 LAB — CBC WITH DIFFERENTIAL/PLATELET
Basophils Absolute: 0 10*3/uL (ref 0.0–0.1)
Basophils Relative: 0 % (ref 0–1)
Eosinophils Absolute: 0.3 10*3/uL (ref 0.0–0.7)
Eosinophils Relative: 4 % (ref 0–5)
HCT: 44.9 % (ref 36.0–46.0)
HEMOGLOBIN: 15.4 g/dL — AB (ref 12.0–15.0)
LYMPHS ABS: 2.3 10*3/uL (ref 0.7–4.0)
Lymphocytes Relative: 28 % (ref 12–46)
MCH: 30.2 pg (ref 26.0–34.0)
MCHC: 34.3 g/dL (ref 30.0–36.0)
MCV: 88 fL (ref 78.0–100.0)
MONOS PCT: 9 % (ref 3–12)
Monocytes Absolute: 0.7 10*3/uL (ref 0.1–1.0)
NEUTROS ABS: 4.9 10*3/uL (ref 1.7–7.7)
NEUTROS PCT: 59 % (ref 43–77)
Platelets: 320 10*3/uL (ref 150–400)
RBC: 5.1 MIL/uL (ref 3.87–5.11)
RDW: 15.5 % (ref 11.5–15.5)
WBC: 8.3 10*3/uL (ref 4.0–10.5)

## 2013-10-25 LAB — VITAMIN B12: Vitamin B-12: 407 pg/mL (ref 211–911)

## 2013-10-25 NOTE — Patient Instructions (Addendum)
We want weight loss that will last so you should lose 1-2 pounds a week.  THAT IS IT! Please pick THREE things a month to change. Once it is a habit check off the item. Then pick another three items off the list to become habits.  If you are already doing a habit on the list GREAT!  Cross that item off! o Don't drink your calories. Ie, alcohol, soda, fruit juice, and sweet tea.  o Drink more water. Drink a glass when you feel hungry or before each meal.  o Eat breakfast - Complex carb and protein (likeDannon light and fit yogurt, oatmeal, fruit, eggs, [place] bacon). o Measure your cereal.  Eat no more than one cup a day. (ie [place]) o Eat an apple a day. o Add a vegetable a day. o Try a new vegetable a month. o Use Pam! Stop using oil or butter to cook. o Don't finish your plate or use smaller plates. o Share your dessert. o Eat sugar free Jello for dessert or frozen grapes. o Don't eat 2-3 hours before bed. o Switch to whole wheat bread, pasta, and brown rice. o Make healthier choices when you eat out. No fries! o Pick baked chicken, NOT fried. o Don't forget to SLOW DOWN when you eat. It is not going anywhere.  o Take the stairs. o Park far away in the parking lot o [employer] (or weights) for 10 minutes while watching TV. o Walk at work for 10 minutes during break. o Walk outside 1 time a week with your friend, kids, dog, or significant other. o Start a walking group at [place] the mall as much as you can tolerate.  o Keep a food diary. o Weigh yourself daily. o Walk for 15 minutes 3 days per week. o Cook at home more often and eat out less.  If life happens and you go back to old habits, it is okay.  Just start over. You can do it!   If you experience chest pain, get short of breath, or tired during the exercise, please stop immediately and inform your doctor.    Bad carbs also include fruit juice, alcohol, and sweet tea. These are empty calories that do not signal to  your brain that you are full.   Please remember the good carbs are still carbs which convert into sugar. So please measure them out no more than 1/2-1 cup of rice, oatmeal, pasta, and beans.  Veggies are however free foods! Pile them on.   I like lean protein at every meal such as chicken, [place], pork chops, cottage cheese, etc. Just do not fry these meats and please center your meal around vegetable, the meats should be a side dish.   No all fruit is created equal. Please see the list below, the fruit at the bottom is higher in sugars than the fruit at the top   Chest Pain (Nonspecific) If pain increases or changes ER ASAP Chest pain has many causes. Your pain could be caused by something serious, such as a heart attack or a blood clot in the lungs. It could also be caused by something less serious, such as a chest bruise or a virus. Follow up with your doctor. More lab tests or other studies may be needed to find the cause of your pain. Most of the time, nonspecific chest pain will improve within 2 to 3 days of rest and mild pain medicine. HOME CARE  For chest  bruises, you may put ice on the sore area for 15-20 minutes, 03-04 times a day. Do this only if it makes you feel better.  Put ice in a plastic bag.  Place a towel between the skin and the bag.  Rest for the next 2 to 3 days.  Go back to work if the pain improves.  See your doctor if the pain lasts longer than 1 to 2 weeks.  Only take medicine as told by your doctor.  Quit smoking if you smoke. GET HELP RIGHT AWAY IF:   There is more pain or pain that spreads to the arm, neck, jaw, back, or belly (abdomen).  You have shortness of breath.  You cough more than usual or cough up blood.  You have very bad back or belly pain, feel sick to your stomach (nauseous), or throw up (vomit).  You have very bad weakness.  You pass out (faint).  You have a fever. Any of these problems may be serious and may be an emergency. Do  not wait to see if the problems will go away. Get medical help right away. Call your local emergency services 911 in [place] Do not drive yourself to the hospital. MAKE SURE YOU:   Understand these instructions.  Will watch this condition.  Will get help right away if you or your child is not doing well or gets worse. Document Released: 01/18/2008 Document Revised: 10/24/2011 Document Reviewed: 01/18/2008 [facility] Patient Information 2014 [place], [place]. Pneumococcal Vaccine, Polyvalent solution for injection What is this medicine? PNEUMOCOCCAL VACCINE, POLYVALENT (NEU mo KOK al vak SEEN, pol ee VEY luhnt) is a vaccine to prevent pneumococcus bacteria infection. These bacteria are a major cause of ear infections, Strep throat infections, and serious pneumonia, meningitis, or blood infections worldwide. These vaccines help the body to produce antibodies (protective substances) that help your body defend against these bacteria. This vaccine is recommended for people 43 years of age and older with health problems. It is also recommended for all adults over 71 years old. This vaccine will not treat an infection. This medicine may be used for other purposes; ask your health care provider or pharmacist if you have questions. COMMON BRAND NAME(S): Pneumovax 23 What should I tell my health care provider before I take this medicine? They need to know if you have any of these conditions: -bleeding problems -bone marrow or organ transplant -cancer, Hodgkin's disease -fever -infection -immune system problems -low platelet count in the blood -seizures -an unusual or allergic reaction to pneumococcal vaccine, diphtheria toxoid, other vaccines, latex, other medicines, foods, dyes, or preservatives -pregnant or trying to get pregnant -breast-feeding How should I use this medicine? This vaccine is for injection into a muscle or under the skin. It is given by a health care professional. A copy of Vaccine  Information Statements will be given before each vaccination. Read this sheet carefully each time. The sheet may change frequently. Talk to your pediatrician regarding the use of this medicine in children. While this drug may be prescribed for children as young as 48 years of age for selected conditions, precautions do apply. Overdosage: If you think you have taken too much of this medicine contact a poison control center or emergency room at once. NOTE: This medicine is only for you. Do not share this medicine with others. What if I miss a dose? It is important not to miss your dose. Call your doctor or health care professional if you are unable to keep an appointment.  What may interact with this medicine? -medicines for cancer chemotherapy -medicines that suppress your immune function -medicines that treat or prevent blood clots like warfarin, enoxaparin, and dalteparin -steroid medicines like prednisone or cortisone This list may not describe all possible interactions. Give your health care provider a list of all the medicines, herbs, non-prescription drugs, or dietary supplements you use. Also tell them if you smoke, drink alcohol, or use illegal drugs. Some items may interact with your medicine. What should I watch for while using this medicine? Mild fever and pain should go away in 3 days or less. Report any unusual symptoms to your doctor or health care professional. What side effects may I notice from receiving this medicine? Side effects that you should report to your doctor or health care professional as soon as possible: -allergic reactions like skin rash, itching or hives, swelling of the face, lips, or tongue -breathing problems -confused -fever over 102 degrees F -pain, tingling, numbness in the hands or feet -seizures -unusual bleeding or bruising -unusual muscle weakness Side effects that usually do not require medical attention (report to your doctor or health care  professional if they continue or are bothersome): -aches and pains -diarrhea -fever of 102 degrees F or less -headache -irritable -loss of appetite -pain, tender at site where injected -trouble sleeping This list may not describe all possible side effects. Call your doctor for medical advice about side effects. You may report side effects to FDA at 1-800-FDA-1088. Where should I keep my medicine? This does not apply. This vaccine is given in a clinic, pharmacy, doctor's office, or other health care setting and will not be stored at home. NOTE: This sheet is a summary. It may not cover all possible information. If you have questions about this medicine, talk to your doctor, pharmacist, or health care provider.  2014, Elsevier/Gold Standard. (2008-03-07 14:32:37)

## 2013-10-25 NOTE — Progress Notes (Signed)
Patient ID: Sara Cross, female   DOB: [birth year], 58 y.o.   MRN: [id number]      Subjective:   Sara Cross is a 58 y.o. female who presents for Medicare Annual Wellness Visit and complete physical.    Date of last medicare wellness visit is unknown.   She notes Chest pain when she lies down but thinks it is related to neck pain. She has also noted heart beats a little faster, usually lasts about 5 min. She has chronic fatigue, but thinks it has increased. She notes she does have increased stress. She still cannot focus. Her husband notes her memory has declined. BS 140.  Her blood pressure has been controlled at home, today their BP is BP: 128/76 mmHg She does not workout. She denies shortness of breath, dizziness. mits to occasionally CP when lying down that lasts 5 minutes and resolves. She is on cholesterol medication and denies myalgias. Her cholesterol is at goal. The cholesterol last visit was:   Lab Results  Component Value Date   CHOL 188 10/25/2013   HDL 73 10/25/2013   LDLCALC 89 10/25/2013   TRIG 130 10/25/2013   CHOLHDL 2.6 10/25/2013   She has not been working on diet and exercise for diabetes, and denies nausea, polydipsia and vomiting. She admits to numbness/ tingling in feet, but notes some symptoms are from chronic back Degenerative Disease. Last A1C in the office was:  Lab Results  Component Value Date   HGBA1C 6.9* 08/19/2013   Patient is on Vitamin D supplement.      Patient Care Team: Unk Pinto, MD as PCP - General (Internal Medicine) Marvene Staff, MD as Consulting Physician (Obstetrics and Gynecology) Elaina Hoops, MD as Consulting Physician (Neurosurgery) Juanita Craver, MD as Consulting Physician (Gastroenterology) DR Johnney Killian or Shelda Jakes, Patient unsure (EYE) Dr Kem Kays, (Dentist)mann   Medication Review Current Outpatient Prescriptions on File Prior to Visit  Medication Sig Dispense Refill  . ALPRAZolam (XANAX) 1 MG tablet Take 1 mg by mouth 2 (two)  times daily as needed for anxiety.      . B Complex-C (SUPER B COMPLEX PO) Take 1 tablet by mouth daily.      . Cholecalciferol (VITAMIN D PO) Take 1 capsule by mouth daily.      . citalopram (CELEXA) 40 MG tablet Take 40 mg by mouth daily.      . cyclobenzaprine (FLEXERIL) 10 MG tablet Take 1 tablet (10 mg total) by mouth at bedtime.  30 tablet  0  . furosemide (LASIX) 20 MG tablet Take 40 mg by mouth daily.      Marland Kitchen lisinopril (PRINIVIL,ZESTRIL) 20 MG tablet TAKE ONE TABLET BY MOUTH ONCE DAILY  90 tablet  0  . MAGNESIUM PO Take 250 mg by mouth daily.      . metFORMIN (GLUCOPHAGE-XR) 500 MG 24 hr tablet Take 1,000 mg by mouth 2 (two) times daily.      Marland Kitchen omeprazole (PRILOSEC) 20 MG capsule Take 20 mg by mouth daily.      Marland Kitchen oxyCODONE-acetaminophen (PERCOCET/ROXICET) 5-325 MG per tablet Take 1 tablet by mouth every 6 (six) hours as needed for pain.  60 tablet  0  . vitamin E (VITAMIN E) 400 UNIT capsule Take 800 Units by mouth daily.       No current facility-administered medications on file prior to visit.    Current Problems (verified) Patient Active Problem List   Diagnosis Date Noted  . DDD (degenerative disc disease)   .  Hypertension   . Depression   . Anxiety   . Sleep apnea   . GERD (gastroesophageal reflux disease)   . 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 Arthritis 04/26/2012  . Hyperlipidemia 04/26/2012  . FIBROIDS, UTERUS 02/17/2009  . OBSTRUCTIVE SLEEP APNEA 02/17/2009  . HYPERTENSION 02/17/2009    Screening Tests Health Maintenance  Topic Date Due  . Pap Smear  12/12/1975  . Colonoscopy  12/12/2007  . Mammogram  09/07/2013  . Influenza Vaccine  03/15/2014  . Tetanus/tdap  07/05/2014    Immunization History  Administered Date(s) Administered  . Influenza Whole 05/15/2013  . Pneumococcal Polysaccharide-23 10/25/2013  . Td 07/05/2004    Preventative care: Last colonoscopy: 2012 Last mammogram: 2012 Last pap smear/pelvic exam: 2012 DEXA:  Prior  vaccinations: TD or Tdap: 2005, NEEDS TDAP we will  Call when available  Influenza: 10/14 Pneumococcal: 10/25/13 Shingles/Zostavax: Declines due to cost  Past Medical History  Diagnosis Date  . Hypertension   . Depression   . Pneumonia     hx of  . Diabetes mellitus     takes metformin daily  . Arthritis   . Hyperlipidemia   . Morbid obesity   . Anxiety   . Sleep apnea     wear cpap nightly  . GERD (gastroesophageal reflux disease)   . Headache(784.0)   . Dizziness   . DDD (degenerative disc disease)    Past Surgical History  Procedure Laterality Date  . Back surgery      01/2011  . Hand surgery      right   2011    FOR INJURY   . Cesearean      X2  . Toenail excision      BIG TOE RIGHT FOOT  . Toe nail removal      right great toe  . Abdominal hysterectomy  2004  . Colonoscopy    . Anterior cervical decomp/discectomy fusion  12/23/2011    Procedure: ANTERIOR CERVICAL DECOMPRESSION/DISCECTOMY FUSION 2 LEVELS;  Surgeon: Elaina Hoops, MD;  Location: [facility];  Service: Neurosurgery;  Laterality: N/A;  Anterior Cervical Four-Five/Five-Six Decompression and Fusion  . Carpal tunnel release  12/23/2011    Procedure: CARPAL TUNNEL RELEASE;  Surgeon: Elaina Hoops, MD;  Location: [facility];  Service: Neurosurgery;  Laterality: Left;  Left Carpal Tunnel Release  . Anterior cervical decomp/discectomy fusion N/A 06/07/2013    Procedure: ANTERIOR CERVICAL DECOMPRESSION/DISCECTOMY FUSION 1 LEVEL/HARDWARE REMOVAL;  Surgeon: Elaina Hoops, MD;  Location: [facility];  Service: Neurosurgery;  Laterality: N/A;  ANTERIOR CERVICAL DECOMPRESSION/DISCECTOMY FUSION 1 LEVEL/HARDWARE REMOVAL   History  Substance Use Topics  . Smoking status: Current Every Day Smoker -- 0.25 packs/day for 30 years    Types: Cigarettes  . Smokeless tobacco: Not on file     Comment: e-cig non-nicotine  . Alcohol Use: Yes     Comment: RARELY - 2 drinks a month   Family History  Problem Relation Age of  Onset  . Anesthesia problems Neg Hx   . Hypotension Neg Hx   . Malignant hyperthermia Neg Hx   . Pseudochol deficiency Neg Hx   . Cancer Father     ORAL  . Hypertension Other   . Hyperlipidemia Other   . Sleep apnea Other   . Obesity Other   . Arthritis Mother   . Diabetes Sister   . Hyperlipidemia Sister   . Diabetes Maternal Grandmother   . Hypertension Maternal Grandmother   .  Hyperlipidemia Maternal Grandmother   . Heart disease Maternal Grandmother      Risk Factors: Osteoporosis: postmenopausal estrogen deficiency History of fracture in the past year: no  Tobacco History  Substance Use Topics  . Smoking status: Current Every Day Smoker -- 0.25 packs/day for 30 years    Types: Cigarettes  . Smokeless tobacco: Not on file     Comment: e-cig non-nicotine  . Alcohol Use: Yes     Comment: RARELY - 2 drinks a month   She does not smoke.  Patient is not a former smoker. Are there smokers in your home (other than you)?  No  Alcohol Current alcohol use: none  Caffeine Current caffeine use: denies use  Exercise Current exercise habits: The patient does not participate in regular exercise at present.  Current exercise: housecleaning  Nutrition/Diet Current diet: in general, an "unhealthy" diet  Cardiac risk factors: diabetes mellitus, dyslipidemia, hypertension, obesity (BMI >= 30 kg/m2) and sedentary lifestyle.  Depression Screen Nurse depression screen reviewed.  (Note: if answer to either of the following is "Yes", a more complete depression screening is indicated)   Q1: Over the past two weeks, have you felt down, depressed or hopeless? No  Q2: Over the past two weeks, have you felt little interest or pleasure in doing things? No  Have you lost interest or pleasure in daily life? No  Do you often feel hopeless? No  Do you cry easily over simple problems? No  Activities of Daily Living Nurse ADLs screen reviewed.  In your present state of health, do  you have any difficulty performing the following activities?:  Driving? No Managing money?  No Feeding yourself? No Getting from bed to chair? No Climbing a flight of stairs? No Preparing food and eating?: No Bathing or showering? No Getting dressed: No Getting to the toilet? No Using the toilet:No Moving around from place to place: No In the past year have you fallen or had a near fall?:No   Are you sexually active?  No  Do you have more than one partner?  No  Vision Difficulties: No  Hearing Difficulties: No Do you often ask people to speak up or repeat themselves? No Do you experience ringing or noises in your ears? No Do you have difficulty understanding soft or whispered voices? No  Cognition  Do you feel that you have a problem with memory?No, but feels focus is terrible, husband notes memory is bad  Do you often misplace items? No  Do you feel safe at home?  Yes  Advanced directives Does patient have a [place]? No Does patient have a Living Will? No    Objective:     Vision and hearing screens reviewed.   BP 128/76  Pulse 76  Temp(Src) 98.6 F (37 C) (Temporal)  Resp 18  Ht 5\' 2"  (1.575 m)  Wt 204 lb (92.534 kg)  BMI 37.30 kg/m2    General appearance: alert, no distress, WD/WN,  female Cognitive Testing  Alert? yes  Normal Appearance?Yes  Oriented to person? Yes  Place? Yes   Time? Yes  Recall of three objects?  Yes  Can perform simple calculations? Yes  Displays appropriate judgment?Yes  Can read the correct time from a watch face?Yes  HEENT: normocephalic, sclerae anicteric, TMs pearly, nares patent, no discharge or erythema, pharynx normal Oral cavity: MMM, no lesions Neck: supple, no lymphadenopathy, no thyromegaly, no masses Heart: RRR, normal S1, S2, no murmurs Lungs: CTA bilaterally, no wheezes,  rhonchi, or rales Abdomen: OBESE centrally,+bs, soft, non tender, non distended, no masses, no hepatomegaly, no  splenomegaly Musculoskeletal: nontender, no swelling, no obvious deformity Extremities: no edema, no cyanosis, no clubbing Pulses: 2+ symmetric, upper and lower extremities, normal cap refill Neurological: alert, oriented x 3, CN2-12 intact, strength normal upper extremities and lower extremities, sensation normal throughout except mild decrease bilaterally in toes, DTRs 2+ throughout, no cerebellar signs, gait normal SKIN:Calluses bilateral heels and great toes, scaling of entire feet, thick yellow toe nails Psychiatric: normal affect, behavior normal, pleasant  Breast: Def to gyn Gyn: Def to gyn  EKG NSCSPT AORTA SCAN- ? FINDING WILL GET FULL U/S evaluation  Assessment:  1. CPE- Update screening labs/ History/ Immunizations/ Testing as needed. Advised healthy diet, QD exercise, increase H20 and continue RX/ Vitamins AD. NEEDS U/S abdomen to evaluation ? Finding on prelim scan at office.   2. 3 month F/U for Obesity, HTN, Cholesterol,DM with neuropathy, D. Deficient. Needs healthy diet, cardio QD and obtain healthy weight. Check Labs, Check BP if >130/80 call office, Check BS if >200 call office. WITH NEW CP symptom and HX refer Cardio for evaluation, w/c if SX increase or ER.   3. Chronic back pain with F/U pending, advised needs increase water exercise and weight loss, keep f/u AD.  4. ADD vs Depression/ Anxiety vs memory changes vs medication SE- check labs, advised needs to decrease pain medications, consider ADD RX if labs / cardiac evaluation 90normal.   5. Neuropathy and poor skin hygiene-Needs podiatry referral  6. Urine Frequency- check labs, hygiene explained Plan:  See above  During the course of the visit the patient was educated and counseled about appropriate screening and preventive services including:    Pneumococcal vaccine   Screening electrocardiogram  Screening Pap smear and pelvic exam   Bone densitometry screening  Diabetes screening  Glaucoma  screening  Nutrition counseling   Screening recommendations, referrals:  Vaccinations: Tdap vaccine not done {Responses; yes/no/not indicated:1655 Influenza vaccine no Pneumococcal vaccine yes Shingles vaccine not done Hep B vaccine no  Nutrition assessed and recommended  Colonoscopy no Mammogram yes Pap smear yes Pelvic exam yes Recommended yearly ophthalmology/optometry visit for glaucoma screening and checkup Recommended yearly dental visit for hygiene and checkup Advanced directives - yes  Conditions/risks identified: BMI: Discussed weight loss, diet, and increase physical activity.  Increase physical activity: AHA recommends 150 minutes of physical activity a week.  Medications reviewed DEXA- ordered Diabetes at goal, ACE/ARB therapy Yes Urinary Incontinence is not an issue: discussed non pharmacology and pharmacology options.  Fall risk: moderate- discussed PT, home fall assessment, medications.   Medicare Attestation I have personally reviewed: The patient's medical and social history Their use of alcohol, tobacco or illicit drugs Their current medications and supplements The patient's functional ability including ADLs,fall risks, home safety risks, cognitive, and hearing and visual impairment Diet and physical activities Evidence for depression or mood disorders  The patient's weight, height, BMI, and visual acuity have been recorded in the chart.  I have made referrals, counseling, and provided education to the patient based on review of the above and I have provided the patient with a written personalized care plan for preventive services.     Kelby Aline, R, PA-C   10/29/2013  PT [phone] first AWV CPT [phone] subsequent AWV

## 2013-10-26 LAB — BASIC METABOLIC PANEL WITH GFR
BUN: 11 mg/dL (ref 6–23)
CO2: 29 mEq/L (ref 19–32)
Calcium: 9.8 mg/dL (ref 8.4–10.5)
Chloride: 97 mEq/L (ref 96–112)
Creat: 0.64 mg/dL (ref 0.50–1.10)
GFR, Est African American: >89 mL/min
Glucose, Bld: 105 mg/dL — ABNORMAL HIGH (ref 70–99)
Potassium: 4.3 mEq/L (ref 3.5–5.3)
Sodium: 137 mEq/L (ref 135–145)

## 2013-10-26 LAB — LIPID PANEL
Cholesterol: 188 mg/dL (ref 0–200)
HDL: 73 mg/dL (ref >39)
LDL Cholesterol: 89 mg/dL (ref 0–99)
Total CHOL/HDL Ratio: 2.6 Ratio
Triglycerides: 130 mg/dL (ref <150)
VLDL: 26 mg/dL (ref 0–40)

## 2013-10-26 LAB — HEPATIC FUNCTION PANEL
ALT: 18 U/L (ref 0–35)
AST: 13 U/L (ref 0–37)
Albumin: 4.6 g/dL (ref 3.5–5.2)
Alkaline Phosphatase: 117 U/L (ref 39–117)
BILIRUBIN DIRECT: 0.1 mg/dL (ref 0.0–0.3)
Indirect Bilirubin: 0.3 mg/dL (ref 0.2–1.2)
Total Bilirubin: 0.4 mg/dL (ref 0.2–1.2)
Total Protein: 7.3 g/dL (ref 6.0–8.3)

## 2013-10-26 LAB — URINALYSIS, ROUTINE W REFLEX MICROSCOPIC
Bilirubin Urine: NEGATIVE
Glucose, UA: NEGATIVE mg/dL
HGB URINE DIPSTICK: NEGATIVE
Ketones, ur: NEGATIVE mg/dL
Leukocytes, UA: NEGATIVE
Nitrite: NEGATIVE
PROTEIN: NEGATIVE mg/dL
Specific Gravity, Urine: 1.007 (ref 1.005–1.030)
Urobilinogen, UA: 0.2 mg/dL (ref 0.0–1.0)
pH: 6.5 (ref 5.0–8.0)

## 2013-10-26 LAB — TSH: TSH: 1.071 u[IU]/mL (ref 0.350–4.500)

## 2013-10-26 LAB — INSULIN, FASTING: INSULIN FASTING, SERUM: 13 u[IU]/mL (ref 3–28)

## 2013-10-26 LAB — MAGNESIUM: Magnesium: 1.9 mg/dL (ref 1.5–2.5)

## 2013-10-26 LAB — VITAMIN D 25 HYDROXY (VIT D DEFICIENCY, FRACTURES): VIT D 25 HYDROXY: 70 ng/mL (ref 30–89)

## 2013-10-27 LAB — URINE CULTURE: Colony Count: 2000

## 2013-10-28 LAB — FRUCTOSAMINE: Fructosamine: 283 umol/L — ABNORMAL HIGH (ref 190–270)

## 2013-10-29 ENCOUNTER — Other Ambulatory Visit: Payer: Self-pay | Admitting: Neurosurgery

## 2013-10-29 DIAGNOSIS — M545 Low back pain, unspecified: Secondary | ICD-10-CM

## 2013-10-30 MED ORDER — METFORMIN HCL ER 500 MG PO TB24: 1000.0000 mg | ORAL_TABLET | Freq: Two times a day (BID) | ORAL | Status: DC

## 2013-10-30 MED ORDER — FUROSEMIDE 20 MG PO TABS: 40.0000 mg | ORAL_TABLET | Freq: Every day | ORAL | Status: DC

## 2013-10-30 MED ORDER — ALPRAZOLAM 1 MG PO TABS: 1.0000 mg | ORAL_TABLET | Freq: Two times a day (BID) | ORAL | Status: DC | PRN

## 2013-10-30 MED ORDER — CYCLOBENZAPRINE HCL 10 MG PO TABS: 10.0000 mg | ORAL_TABLET | Freq: Every day | ORAL | Status: DC

## 2013-10-30 MED ORDER — LISINOPRIL 20 MG PO TABS: ORAL_TABLET | ORAL | Status: DC

## 2013-10-30 MED ORDER — CITALOPRAM HYDROBROMIDE 40 MG PO TABS: 40.0000 mg | ORAL_TABLET | Freq: Every day | ORAL | Status: DC

## 2013-10-30 MED ORDER — OMEPRAZOLE 20 MG PO CPDR: 20.0000 mg | DELAYED_RELEASE_CAPSULE | Freq: Every day | ORAL | Status: DC

## 2013-11-01 ENCOUNTER — Ambulatory Visit (INDEPENDENT_AMBULATORY_CARE_PROVIDER_SITE_OTHER): Payer: BLUE CROSS/BLUE SHIELD | Admitting: Podiatry

## 2013-11-01 ENCOUNTER — Encounter: Payer: Self-pay | Admitting: Podiatry

## 2013-11-01 VITALS — BP 148/90 | HR 84 | Ht 62.0 in | Wt 204.0 lb

## 2013-11-01 DIAGNOSIS — M79609 Pain in unspecified limb: Secondary | ICD-10-CM

## 2013-11-01 DIAGNOSIS — E1140 Type 2 diabetes mellitus with diabetic neuropathy, unspecified: Secondary | ICD-10-CM

## 2013-11-01 DIAGNOSIS — E1149 Type 2 diabetes mellitus with other diabetic neurological complication: Secondary | ICD-10-CM

## 2013-11-01 DIAGNOSIS — M79606 Pain in leg, unspecified: Secondary | ICD-10-CM

## 2013-11-01 DIAGNOSIS — E1142 Type 2 diabetes mellitus with diabetic polyneuropathy: Secondary | ICD-10-CM

## 2013-11-01 MED ORDER — GABAPENTIN 100 MG PO CAPS: 100.0000 mg | ORAL_CAPSULE | Freq: Three times a day (TID) | ORAL | Status: DC

## 2013-11-01 NOTE — Progress Notes (Signed)
Subjective: 58 year old female presents complaining of burning feet. Has had lumbar surgery in 2012 and also scheduled to have another surgery in lower lumbar in near future.  First she got numbness on feet about 8 months ago. Gets numb when stand on feet. Now it hurts at night with burning pain when lying down. This burning pain started about 6 month ago. Also get some shooting pain when standing on her feet at times.  Stated that her Blood sugar is under control with Metforman. This morning was 140.  On feet about 12-14 hours a day. She is not employed but takes care of Grandchildren and cares for her mother who has dementia.   Review of Systems - General ROS: positive for  - hot flashes, night sweats and weight gain Ophthalmic ROS: negative ENT ROS: negative Allergy and Immunology ROS: negative Endocrine ROS: negative Respiratory ROS: no cough, shortness of breath, or wheezing Cardiovascular ROS: Had done ultra sound and will have another test done for her heart.  Gastrointestinal ROS: no abdominal pain, change in bowel habits, or black or bloody stools Genito-Urinary ROS: no dysuria, trouble voiding, or hematuria Musculoskeletal ROS: Pain in lower back, upper arm, and in chest. Had surgery done in cervical spine and getting monitored.  Dermatological ROS: Dry skin with craced heel in both feet.   Objective: Dermatologic: Has had right great toe nail plate removed in past.  Left great toe nail is thickened with mild discoloration but in normal shape.  Dry cracking heel and plantar surface bilateral. No open lesions. Vascular: Pedal pulses are not palpable both DP and PT bilateral.  No sign of ischemic changes noted.  Neurologic: Decreased response to monofilament sensory testing. Normal response to vibratory sensation.  Orthopedic: Rectus foot without gross deformity bilateral.  Assessment: 1. R/O Lumbar radiculopathy. 2. Diabetic neuropathy. 3. R/O Peripheral vascular insufficiency.   4. Tinea pedis plantar.  Plan: Reviewed clinical findings and diabetic foot care.  Home care with Salsun blue shampoo foot scrub discussed. After this home care  Will try Neurontin to see any change in neurologic foot pain.  Wear well supported comfortable shoes.  Return in one month for follow up.  Return in 1 month.

## 2013-11-01 NOTE — Patient Instructions (Signed)
Seen for burning foot pain.  Possible diabetic neuropathy. Gabapentin prescribed. Return in one month.

## 2013-11-04 ENCOUNTER — Ambulatory Visit
Admission: RE | Admit: 2013-11-04 | Discharge: 2013-11-04 | Disposition: A | Discharge status: Home / Self Care | Payer: Medicare Other | Source: Ambulatory Visit | Attending: Emergency Medicine | Admitting: Emergency Medicine

## 2013-11-04 DIAGNOSIS — I10 Essential (primary) hypertension: Secondary | ICD-10-CM

## 2013-11-04 DIAGNOSIS — R6889 Other general symptoms and signs: Secondary | ICD-10-CM

## 2013-11-05 ENCOUNTER — Telehealth: Payer: Self-pay | Admitting: *Deleted

## 2013-11-07 NOTE — Telephone Encounter (Signed)
DONE

## 2013-11-08 ENCOUNTER — Ambulatory Visit
Admission: RE | Admit: 2013-11-08 | Discharge: 2013-11-08 | Disposition: A | Discharge status: Home / Self Care | Payer: Medicare Other | Source: Ambulatory Visit | Attending: Neurosurgery | Admitting: Neurosurgery

## 2013-11-08 DIAGNOSIS — M545 Low back pain, unspecified: Secondary | ICD-10-CM

## 2013-11-12 ENCOUNTER — Telehealth: Payer: Self-pay

## 2013-11-26 ENCOUNTER — Encounter: Payer: Self-pay | Admitting: Cardiology

## 2013-11-26 ENCOUNTER — Encounter (INDEPENDENT_AMBULATORY_CARE_PROVIDER_SITE_OTHER): Payer: Self-pay

## 2013-11-26 ENCOUNTER — Ambulatory Visit (INDEPENDENT_AMBULATORY_CARE_PROVIDER_SITE_OTHER): Payer: Medicare Other | Admitting: Cardiology

## 2013-11-26 VITALS — BP 152/100 | HR 102 | Ht 62.0 in | Wt 210.0 lb

## 2013-11-26 DIAGNOSIS — R079 Chest pain, unspecified: Secondary | ICD-10-CM

## 2013-11-26 DIAGNOSIS — E782 Mixed hyperlipidemia: Secondary | ICD-10-CM | POA: Insufficient documentation

## 2013-11-26 DIAGNOSIS — R002 Palpitations: Secondary | ICD-10-CM | POA: Insufficient documentation

## 2013-11-26 DIAGNOSIS — I10 Essential (primary) hypertension: Secondary | ICD-10-CM

## 2013-11-26 DIAGNOSIS — E785 Hyperlipidemia, unspecified: Secondary | ICD-10-CM

## 2013-11-26 MED ORDER — LISINOPRIL 40 MG PO TABS: ORAL_TABLET | ORAL | Status: DC

## 2013-11-26 NOTE — Patient Instructions (Addendum)
Your physician has recommended you make the following change in your medication: 1. Increase Lisinopril to 40 MG 1 tablet daily  Your physician has requested that you have an exercise stress myoview. For further information please visit [email or web address]. Please follow instruction sheet, as given.  Your physician has requested that you have an echocardiogram. Echocardiography is a painless test that uses sound waves to create images of your heart. It provides your doctor with information about the size and shape of your heart and how well your heart's chambers and valves are working. This procedure takes approximately one hour. There are no restrictions for this procedure.  Your physician has recommended that you wear an event monitor. Event monitors are medical devices that record the heart's electrical activity. Doctors most often [place] these monitors to diagnose arrhythmias. Arrhythmias are problems with the speed or rhythm of the heartbeat. The monitor is a small, portable device. You can wear one while you do your normal daily activities. This is usually used to diagnose what is causing palpitations/syncope (passing out).  Your physician recommends that you schedule a follow-up appointment in: 1 week with Nurse for BP check and set pt up for BMET lab work same day  Your physician recommends that you schedule a follow-up appointment in: 4 Weeks with Dr Radford Pax

## 2013-11-26 NOTE — Progress Notes (Signed)
Sara Cross, Sara Cross Sara Cross, Sara Cross  [address] Phone: [phone] Fax:  [phone]  Date:  11/26/2013   ID:  Sara Cross, DOB [birth year], MRN [id number]  PCP:  Sara Richards, MD  Cardiologist:  Fransico Him, MD     History of Present Illness: Sara Cross is a 58 y.o. female with a history of HTN, dyslipidemia and obesity who presents today for evaluation of chest pain and back pain between her shoulder blades.  She also has noticed fast heart beats that she can feel pulsating in her finger tips.  She has a lot of spine disease and has had 2 surgeries and has had persistent pain in her arms since then and was referred for cardiac evaluation.  She describes the chest pain as a tooth ache that comes and goes and moves across her chest and down both arms.  The pain between her shoulder blades is the same pain as well.  The pain is nonexertional and exertional.  She notices the rapid heart beat when she lays down.  She has noticed SOB when she exerts herself but not with the chest pain.  When she gets the CP she gets diaphoretic but no nausea.  She has noticed some lightheadedness but no syncope.   Wt Readings from Last 3 Encounters:  11/26/13 210 lb (95.255 kg)  11/01/13 204 lb (92.534 kg)  10/25/13 204 lb (92.534 kg)     Past Medical History  Diagnosis Date  . Depression   . Pneumonia     hx of  . Diabetes mellitus     takes metformin daily  . Arthritis   . Morbid obesity   . Anxiety   . Sleep apnea     wear cpap nightly  . GERD (gastroesophageal reflux disease)   . Headache(784.0)   . Dizziness   . DDD (degenerative disc disease)   . Hypertension   . Hyperlipidemia     Current Outpatient Prescriptions  Medication Sig Dispense Refill  . ALPRAZolam (XANAX) 1 MG tablet Take 1 tablet (1 mg total) by mouth 2 (two) times daily as needed for anxiety.  60 tablet  0  . B Complex-C (SUPER B COMPLEX PO) Take 1 tablet by mouth daily.      . Cholecalciferol (VITAMIN D  PO) Take 1 capsule by mouth daily.      . citalopram (CELEXA) 40 MG tablet Take 1 tablet (40 mg total) by mouth daily.  90 tablet  1  . cyclobenzaprine (FLEXERIL) 10 MG tablet Take 1 tablet (10 mg total) by mouth at bedtime.  90 tablet  0  . furosemide (LASIX) 20 MG tablet Take 2 tablets (40 mg total) by mouth daily.  180 tablet  1  . gabapentin (NEURONTIN) 100 MG capsule Take 1 capsule (100 mg total) by mouth 3 (three) times daily.  90 capsule  3  . lisinopril (PRINIVIL,ZESTRIL) 20 MG tablet TAKE ONE TABLET BY MOUTH ONCE DAILY  90 tablet  1  . MAGNESIUM PO Take 250 mg by mouth daily.      . metFORMIN (GLUCOPHAGE-XR) 500 MG 24 hr tablet Take 2 tablets (1,000 mg total) by mouth 2 (two) times daily.  360 tablet  1  . omeprazole (PRILOSEC) 20 MG capsule Take 1 capsule (20 mg total) by mouth daily.  90 capsule  1  . oxyCODONE-acetaminophen (PERCOCET/ROXICET) 5-325 MG per tablet Take 1 tablet by mouth every 6 (six) hours as needed for pain.  60 tablet  0  . VITAMIN A EX Apply topically daily.      . vitamin E (VITAMIN E) 400 UNIT capsule Take 800 Units by mouth daily.       No current facility-administered medications for this visit.    Allergies:    Allergies  Allergen Reactions  . Benicar [Olmesartan]   . Lortab [Hydrocodone-Acetaminophen] Itching  . Ziac [Bisoprolol-Hydrochlorothiazide]     Social History:  The patient  reports that she has been smoking Cigarettes.  She has a 7.5 pack-year smoking history. She has never used smokeless tobacco. She reports that she drinks alcohol. She reports that she does not use illicit drugs.   Family History:  The patient's family history includes Arthritis in her mother; Cancer in her father; Diabetes in her maternal grandmother and sister; Heart disease in her maternal grandmother; Hyperlipidemia in her maternal grandmother, other, and sister; Hypertension in her maternal grandmother and other; Obesity in her other; Sleep apnea in her other. There is no  history of Anesthesia problems, Hypotension, Malignant hyperthermia, or Pseudochol deficiency.   ROS:  Please see the history of present illness.      All other systems reviewed and negative.   PHYSICAL EXAM: VS:  BP 152/100  Pulse 102  Ht 5\' 2"  (1.575 m)  Wt 210 lb (95.255 kg)  BMI 38.40 kg/m2 Well nourished, well developed, in no acute distress HEENT: normal Neck: no JVD Cardiac:  normal S1, S2; RRR; no murmur Lungs:  clear to auscultation bilaterally, no wheezing, rhonchi or rales Abd: soft, nontender, no hepatomegaly Ext: no edema Skin: warm and dry Neuro:  CNs 2-12 intact, no focal abnormalities noted  EKG:    NSR with no ST changes   ASSESSMENT AND PLAN:  1. Chest pain with multiple CRF including HTN, dyslipidemia, ongoing tobacco use, family history of CAD and DM.  EKG is nonischemic.   - check 2D echo to assess LVF - check stress myoview to rule out ischemia 2. HTN - poorly controlled - Increase Lisinopril to 40mg  daily - check BMET and BP with nurse in 1 week 3. Dyslipidemia 4. DM 5. palpitations - check event monitor  Followup with me in 4 weeks  Signed, Fransico Him, MD 11/26/2013 11:47 AM

## 2013-11-29 ENCOUNTER — Other Ambulatory Visit: Payer: Self-pay | Admitting: Neurosurgery

## 2013-11-29 DIAGNOSIS — M5412 Radiculopathy, cervical region: Secondary | ICD-10-CM

## 2013-11-29 DIAGNOSIS — M4120 Other idiopathic scoliosis, site unspecified: Secondary | ICD-10-CM

## 2013-12-06 ENCOUNTER — Ambulatory Visit
Admission: RE | Admit: 2013-12-06 | Discharge: 2013-12-06 | Disposition: A | Discharge status: Home / Self Care | Payer: Medicare Other | Source: Ambulatory Visit | Attending: Neurosurgery | Admitting: Neurosurgery

## 2013-12-06 ENCOUNTER — Inpatient Hospital Stay: Admission: RE | Admit: 2013-12-06 | Payer: Medicare Other | Source: Ambulatory Visit

## 2013-12-06 DIAGNOSIS — M4120 Other idiopathic scoliosis, site unspecified: Secondary | ICD-10-CM

## 2013-12-06 DIAGNOSIS — M5412 Radiculopathy, cervical region: Secondary | ICD-10-CM

## 2013-12-10 ENCOUNTER — Other Ambulatory Visit: Payer: Self-pay | Admitting: Internal Medicine

## 2013-12-11 ENCOUNTER — Ambulatory Visit (HOSPITAL_COMMUNITY): Payer: Medicare Other | Attending: Cardiovascular Disease | Admitting: Radiology

## 2013-12-11 ENCOUNTER — Encounter: Payer: Self-pay | Admitting: Internal Medicine

## 2013-12-11 ENCOUNTER — Ambulatory Visit (HOSPITAL_BASED_OUTPATIENT_CLINIC_OR_DEPARTMENT_OTHER): Payer: Medicare Other | Admitting: Radiology

## 2013-12-11 ENCOUNTER — Encounter (INDEPENDENT_AMBULATORY_CARE_PROVIDER_SITE_OTHER): Payer: Medicare Other

## 2013-12-11 ENCOUNTER — Encounter: Payer: Self-pay | Admitting: *Deleted

## 2013-12-11 ENCOUNTER — Encounter (HOSPITAL_COMMUNITY): Payer: Self-pay | Admitting: Radiology

## 2013-12-11 VITALS — BP 137/80 | HR 80 | Ht 62.0 in | Wt 208.0 lb

## 2013-12-11 DIAGNOSIS — R079 Chest pain, unspecified: Secondary | ICD-10-CM

## 2013-12-11 DIAGNOSIS — R0602 Shortness of breath: Secondary | ICD-10-CM

## 2013-12-11 DIAGNOSIS — R002 Palpitations: Secondary | ICD-10-CM

## 2013-12-11 DIAGNOSIS — R072 Precordial pain: Secondary | ICD-10-CM

## 2013-12-11 MED ORDER — TECHNETIUM TC 99M SESTAMIBI GENERIC - CARDIOLITE
11.0000 | Freq: Once | INTRAVENOUS | Status: AC | PRN
  Administered 2013-12-11: 11 via INTRAVENOUS

## 2013-12-11 MED ORDER — REGADENOSON 0.4 MG/5ML IV SOLN
0.4000 mg | Freq: Once | INTRAVENOUS | Status: AC
  Administered 2013-12-11: 0.4 mg via INTRAVENOUS

## 2013-12-11 MED ORDER — TECHNETIUM TC 99M SESTAMIBI GENERIC - CARDIOLITE
33.0000 | Freq: Once | INTRAVENOUS | Status: AC | PRN
  Administered 2013-12-11: 33 via INTRAVENOUS

## 2013-12-11 NOTE — Progress Notes (Signed)
Patient ID: Sara Cross, female   DOB: 1956-01-05, 58 y.o.   MRN: [id number] [employer] 30 day cardiac event monitor applied to patient.

## 2013-12-11 NOTE — Progress Notes (Signed)
Echocardiogram performed.  

## 2013-12-11 NOTE — Progress Notes (Signed)
[facility] [address] [place], [place] [address] [phone]    Cardiology Nuclear Med Study  Sara Cross is a 58 y.o. female     MRN : [id number]     DOB: Jun 11, 1956  Procedure Date: 12/11/2013  Nuclear Med Background Indication for Stress Test:  Evaluation for Ischemia, and Pending Surgical Clearance for  Back Surgery by Kary Kos, MD History:  No known CAD, Echo (pending) Cardiac Risk Factors: Carotid Disease, Family History - CAD, Hypertension, Lipids, NIDDM and Smoker  Symptoms:  Chest Pain (last date of chest discomfort was Monday), DOE and Palpitations   Nuclear Pre-Procedure Caffeine/Decaff Intake:  10:00pm Decaffeinated tea NPO After: 10:00pm   Lungs:  clear O2 Sat: 98% on room air. IV 0.9% NS with Angio Cath:  22g  IV Site: R Forearm x 1, tolerated well IV Started by:  Irven Baltimore, RN  Chest Size (in):  40 Cup Size: DDD  Height: 5\' 2"  (1.575 m)  Weight:  208 lb (94.348 kg)  BMI:  Body mass index is 38.03 kg/(m^2). Tech Comments:  Patient took Lisinopril and held Metformin this am. Irven Baltimore, RN.    Nuclear Med Study 1 or 2 day study: 1 day  Stress Test Type:  Treadmill/Lexiscan  Reading MD: N/A  Order Authorizing Provider:  Fransico Him, MD  Resting Radionuclide: Technetium 63m Sestamibi  Resting Radionuclide Dose: 11.0 mCi   Stress Radionuclide:  Technetium 55m Sestamibi  Stress Radionuclide Dose: 33.0 mCi           Stress Protocol Rest HR: 80 Stress HR: 121  Rest BP: 137/80 Stress BP: 122/69  Exercise Time (min): n/a METS: n/a           Dose of Adenosine (mg):  n/a Dose of Lexiscan: 0.4 mg  Dose of Atropine (mg): n/a Dose of Dobutamine: n/a mcg/kg/min (at max HR)  Stress Test Technologist: Glade Lloyd, BS-ES  Nuclear Technologist:  Charlton Amor, CNMT     Rest Procedure:  Myocardial perfusion imaging was performed at rest 45 minutes following the intravenous administration of Technetium 16m Sestamibi. Rest  ECG: NSR - Normal EKG  Stress Procedure:  The patient received IV Lexiscan 0.4 mg over 15-seconds with concurrent low level exercise and then Technetium 50m Sestamibi was injected at 30-seconds while the patient continued walking one more minute.  Quantitative spect images were obtained after a 45-minute delay.  During the infusion of Lexiscan, the patient complained of weak legs, a funny feeling in her head and fatigued.  These symptoms began to resolve in recovery.  Stress ECG: No significant ST segment change suggestive of ischemia.  QPS Raw Data Images:  Normal; no motion artifact; normal heart/lung ratio. Stress Images:  Normal homogeneous uptake in all areas of the myocardium. Rest Images:  Normal homogeneous uptake in all areas of the myocardium. Subtraction (SDS):  No evidence of ischemia. Transient Ischemic Dilatation (Normal <1.22):  1.00 Lung/Heart Ratio (Normal <0.45):  0.33  Quantitative Gated Spect Images QGS EDV:  88 ml QGS ESV:  34 ml  Impression Exercise Capacity:  Lexiscan with low level exercise. BP Response:  Normal blood pressure response. Clinical Symptoms:  No significant symptoms noted. ECG Impression:  No significant ST segment change suggestive of ischemia. Comparison with Prior Nuclear Study: No previous nuclear study performed  Overall Impression:  Normal stress nuclear study.  LV Ejection Fraction: 61%.  LV Wall Motion:  NL LV Function; NL Wall Motion  Sanda Klein, MD,  [facility] [phone] office [phone] pager

## 2013-12-16 ENCOUNTER — Telehealth: Payer: Self-pay | Admitting: Cardiology

## 2013-12-16 NOTE — Telephone Encounter (Signed)
Returned call to patient 12/11/13 echo results given.Advised to keep appointment with Dr.Turner 12/24/13 at 10:15 am.

## 2013-12-16 NOTE — Telephone Encounter (Signed)
New message     Returning Sara Cross's call from last week

## 2013-12-24 ENCOUNTER — Ambulatory Visit (INDEPENDENT_AMBULATORY_CARE_PROVIDER_SITE_OTHER): Payer: Medicare Other | Admitting: Cardiology

## 2013-12-24 ENCOUNTER — Encounter: Payer: Self-pay | Admitting: Cardiology

## 2013-12-24 VITALS — BP 127/81 | HR 91 | Ht 62.0 in | Wt 218.0 lb

## 2013-12-24 DIAGNOSIS — I10 Essential (primary) hypertension: Secondary | ICD-10-CM

## 2013-12-24 DIAGNOSIS — R002 Palpitations: Secondary | ICD-10-CM

## 2013-12-24 DIAGNOSIS — R079 Chest pain, unspecified: Secondary | ICD-10-CM

## 2013-12-24 NOTE — Progress Notes (Signed)
[address] [place], [place]  [address] Phone: [phone] Fax:  [phone]  Date:  12/24/2013   ID:  Sara Cross, DOB [birth year], MRN [id number]  PCP:  Alesia Richards, MD  Cardiologist:  Fransico Him, MD     History of Present Illness: Sara Cross is a 58 y.o. female with a history of HTN, dyslipidemia and obesity who presents today for followup of recent OV for chest pain and back pain between her shoulder blades as well as palpitations.  She has a lot of spine disease and has had 2 surgeries and has had persistent pain in her arms since then and was referred for cardiac evaluation. She describes the chest pain as a tooth ache that comes and goes and moves across her chest and down both arms. The pain between her shoulder blades is the same pain as well. The pain is nonexertional and exertional. She notices the rapid heart beat when she lays down. She has noticed SOB when she exerts herself but not with the chest pain. When she gets the CP she gets diaphoretic but no nausea. She has noticed some lightheadedness but no syncope.  She recently underwent nuclear stress test which showed no ischemia and normal LVF.  2D echo showed normal LVF with diastolic dysfunction.  She now presents back today for followup.  She saw Dr. Saintclair Halsted last week and she is going to have more surgery on her back.  He thinks her back and chest pain is coming from her back due to severe DJD and scoliosis. So far she has not had any palpitations while wearing the heart monitor.  Wt Readings from Last 3 Encounters:  12/11/13 208 lb (94.348 kg)  11/26/13 210 lb (95.255 kg)  11/01/13 204 lb (92.534 kg)     Past Medical History  Diagnosis Date  . Depression   . Pneumonia     hx of  . Diabetes mellitus     takes metformin daily  . Arthritis   . Morbid obesity   . Anxiety   . Sleep apnea     wear cpap nightly  . GERD (gastroesophageal reflux disease)   . Headache(784.0)   . Dizziness   . DDD  (degenerative disc disease)   . Hypertension   . Hyperlipidemia     Current Outpatient Prescriptions  Medication Sig Dispense Refill  . ALPRAZolam (XANAX) 1 MG tablet TAKE ONE-HALF TO ONE TABLET BY MOUTH TWICE DAILY FOR ANXIETY  60 tablet  0  . B Complex-C (SUPER B COMPLEX PO) Take 1 tablet by mouth daily.      . Cholecalciferol (VITAMIN D PO) Take 1 capsule by mouth daily.      . citalopram (CELEXA) 40 MG tablet Take 1 tablet (40 mg total) by mouth daily.  90 tablet  1  . cyclobenzaprine (FLEXERIL) 10 MG tablet Take 1 tablet (10 mg total) by mouth at bedtime.  90 tablet  0  . furosemide (LASIX) 20 MG tablet Take 2 tablets (40 mg total) by mouth daily.  180 tablet  1  . gabapentin (NEURONTIN) 100 MG capsule Take 1 capsule (100 mg total) by mouth 3 (three) times daily.  90 capsule  3  . lisinopril (PRINIVIL,ZESTRIL) 40 MG tablet TAKE ONE TABLET BY MOUTH ONCE DAILY  30 tablet  5  . MAGNESIUM PO Take 250 mg by mouth daily.      . metFORMIN (GLUCOPHAGE-XR) 500 MG 24 hr tablet Take 2 tablets (1,000 mg  total) by mouth 2 (two) times daily.  360 tablet  1  . omeprazole (PRILOSEC) 20 MG capsule Take 1 capsule (20 mg total) by mouth daily.  90 capsule  1  . oxyCODONE-acetaminophen (PERCOCET/ROXICET) 5-325 MG per tablet Take 1 tablet by mouth every 6 (six) hours as needed for pain.  60 tablet  0  . VITAMIN A EX Apply topically daily.      . vitamin E (VITAMIN E) 400 UNIT capsule Take 800 Units by mouth daily.       No current facility-administered medications for this visit.    Allergies:    Allergies  Allergen Reactions  . Benicar [Olmesartan]   . Lortab [Hydrocodone-Acetaminophen] Itching  . Ziac [Bisoprolol-Hydrochlorothiazide]     Social History:  The patient  reports that she has been smoking Cigarettes.  She has a 7.5 pack-year smoking history. She has never used smokeless tobacco. She reports that she drinks alcohol. She reports that she does not use illicit drugs.   Family History:   The patient's family history includes Arthritis in her mother; Cancer in her father; Diabetes in her maternal grandmother and sister; Heart disease in her maternal grandmother; Hyperlipidemia in her maternal grandmother, other, and sister; Hypertension in her maternal grandmother and other; Obesity in her other; Sleep apnea in her other. There is no history of Anesthesia problems, Hypotension, Malignant hyperthermia, or Pseudochol deficiency.   ROS:  Please see the history of present illness.      All other systems reviewed and negative.   PHYSICAL EXAM: VS:  There were no vitals taken for this visit. Well nourished, well developed, in no acute distress HEENT: normal Neck: no JVD Cardiac:  normal S1, S2; RRR; no murmur Lungs:  clear to auscultation bilaterally, no wheezing, rhonchi or rales Abd: soft, nontender, no hepatomegaly Ext: no edema Skin: warm and dry Neuro:  CNs 2-12 intact, no focal abnormalities noted      ASSESSMENT AND PLAN:  1.  Chest pain with multiple CRF including HTN, dyslipidemia, ongoing tobacco use, family history of CAD and DM. EKG is nonischemic. Nuclear stress test showed on ischemia and normal LVF.  2D echo with normal LVF and diastolic dysfunction.  I do not think her pain is related to CAD.  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  She is cleared from a cardiac standpoint for surgery on her back. 2.  HTN - well controlled - Continue Lisinopril  3.  Dyslipidemia 4.  DM 5.  palpitations - She will continue her heart monitor one more week.  If no arrhythmias then she can proceed with her back surgery.    Followup with me PRN  Signed, Fransico Him, MD 12/24/2013 10:09 AM

## 2013-12-24 NOTE — Patient Instructions (Signed)
Your physician recommends that you continue on your current medications as directed. Please refer to the Current Medication list given to you today.  Your physician recommends that you schedule a follow-up appointment as needed with Dr Turner 

## 2014-01-01 NOTE — Telephone Encounter (Signed)
Error

## 2014-01-24 ENCOUNTER — Telehealth: Payer: Self-pay | Admitting: Cardiology

## 2014-01-24 DIAGNOSIS — R000 Tachycardia, unspecified: Secondary | ICD-10-CM

## 2014-01-24 NOTE — Telephone Encounter (Signed)
Left message to call back  

## 2014-01-24 NOTE — Addendum Note (Signed)
Addended by: Alvina Filbert B on: 01/24/2014 12:58 PM   Modules accepted: Orders

## 2014-01-24 NOTE — Telephone Encounter (Signed)
Advised patient of results and sent to [facility] to schedule monitor

## 2014-01-24 NOTE — Telephone Encounter (Signed)
Please let patient know that heart monitor was fine but slightly fast.  Please get a 24 hour Holter to assess average HR

## 2014-01-29 ENCOUNTER — Encounter (INDEPENDENT_AMBULATORY_CARE_PROVIDER_SITE_OTHER): Payer: Medicare Other

## 2014-01-29 ENCOUNTER — Encounter: Payer: Self-pay | Admitting: Radiology

## 2014-01-29 DIAGNOSIS — R000 Tachycardia, unspecified: Secondary | ICD-10-CM

## 2014-01-29 NOTE — Progress Notes (Signed)
Patient ID: Sara Cross, female   DOB: 1956/03/01, 58 y.o.   MRN: [id number] E cardio 24hr holter applied

## 2014-02-04 ENCOUNTER — Other Ambulatory Visit: Payer: Self-pay | Admitting: Physician Assistant

## 2014-02-04 ENCOUNTER — Telehealth: Payer: Self-pay | Admitting: Cardiology

## 2014-02-04 NOTE — Telephone Encounter (Signed)
Pt is aware.  

## 2014-02-04 NOTE — Telephone Encounter (Signed)
Please let patient know that heart monitor showed NSR with average HR 97bpm with max HR 134bpm and min HR 77bpm.

## 2014-02-26 ENCOUNTER — Encounter: Payer: Self-pay | Admitting: Internal Medicine

## 2014-02-26 ENCOUNTER — Ambulatory Visit (INDEPENDENT_AMBULATORY_CARE_PROVIDER_SITE_OTHER): Payer: Medicare Other | Admitting: Internal Medicine

## 2014-02-26 ENCOUNTER — Other Ambulatory Visit: Payer: Self-pay | Admitting: Internal Medicine

## 2014-02-26 VITALS — BP 116/68 | HR 80 | Temp 97.9°F | Resp 18 | Ht 62.0 in | Wt 217.0 lb

## 2014-02-26 DIAGNOSIS — E559 Vitamin D deficiency, unspecified: Secondary | ICD-10-CM

## 2014-02-26 DIAGNOSIS — E1149 Type 2 diabetes mellitus with other diabetic neurological complication: Secondary | ICD-10-CM

## 2014-02-26 DIAGNOSIS — I10 Essential (primary) hypertension: Secondary | ICD-10-CM

## 2014-02-26 DIAGNOSIS — E782 Mixed hyperlipidemia: Secondary | ICD-10-CM

## 2014-02-26 DIAGNOSIS — G4730 Sleep apnea, unspecified: Secondary | ICD-10-CM

## 2014-02-26 DIAGNOSIS — F988 Other specified behavioral and emotional disorders with onset usually occurring in childhood and adolescence: Secondary | ICD-10-CM

## 2014-02-26 DIAGNOSIS — Z79899 Other long term (current) drug therapy: Secondary | ICD-10-CM

## 2014-02-26 LAB — HEPATIC FUNCTION PANEL
ALK PHOS: 110 U/L (ref 39–117)
ALT: 14 U/L (ref 0–35)
AST: 12 U/L (ref 0–37)
Albumin: 4.1 g/dL (ref 3.5–5.2)
BILIRUBIN TOTAL: 0.3 mg/dL (ref 0.2–1.2)
Total Protein: 6.4 g/dL (ref 6.0–8.3)

## 2014-02-26 LAB — CBC WITH DIFFERENTIAL/PLATELET
BASOS ABS: 0.1 10*3/uL (ref 0.0–0.1)
BASOS PCT: 1 % (ref 0–1)
Eosinophils Absolute: 0.4 10*3/uL (ref 0.0–0.7)
Eosinophils Relative: 7 % — ABNORMAL HIGH (ref 0–5)
HEMATOCRIT: 39.3 % (ref 36.0–46.0)
Hemoglobin: 13.4 g/dL (ref 12.0–15.0)
Lymphocytes Relative: 21 % (ref 12–46)
Lymphs Abs: 1.1 10*3/uL (ref 0.7–4.0)
MCH: 30.7 pg (ref 26.0–34.0)
MCHC: 34.1 g/dL (ref 30.0–36.0)
MCV: 90.1 fL (ref 78.0–100.0)
MONO ABS: 0.4 10*3/uL (ref 0.1–1.0)
Monocytes Relative: 7 % (ref 3–12)
NEUTROS ABS: 3.5 10*3/uL (ref 1.7–7.7)
Neutrophils Relative %: 64 % (ref 43–77)
PLATELETS: 275 10*3/uL (ref 150–400)
RBC: 4.36 MIL/uL (ref 3.87–5.11)
RDW: 14.6 % (ref 11.5–15.5)
WBC: 5.4 10*3/uL (ref 4.0–10.5)

## 2014-02-26 LAB — LIPID PANEL
Cholesterol: 184 mg/dL (ref 0–200)
HDL: 54 mg/dL (ref >39)
LDL Cholesterol: 85 mg/dL (ref 0–99)
Total CHOL/HDL Ratio: 3.4 Ratio
Triglycerides: 224 mg/dL — ABNORMAL HIGH (ref <150)
VLDL: 45 mg/dL — ABNORMAL HIGH (ref 0–40)

## 2014-02-26 LAB — BASIC METABOLIC PANEL WITH GFR
BUN: 11 mg/dL (ref 6–23)
CO2: 25 mEq/L (ref 19–32)
CREATININE: 0.68 mg/dL (ref 0.50–1.10)
Calcium: 9.1 mg/dL (ref 8.4–10.5)
Chloride: 102 mEq/L (ref 96–112)
GFR, Est Non African American: >89 mL/min
Glucose, Bld: 120 mg/dL — ABNORMAL HIGH (ref 70–99)
POTASSIUM: 4.2 meq/L (ref 3.5–5.3)
Sodium: 136 mEq/L (ref 135–145)

## 2014-02-26 LAB — HEMOGLOBIN A1C
Hgb A1c MFr Bld: 6.8 % — ABNORMAL HIGH (ref <5.7)
MEAN PLASMA GLUCOSE: 148 mg/dL — AB (ref <117)

## 2014-02-26 LAB — TSH: TSH: 1.048 u[IU]/mL (ref 0.350–4.500)

## 2014-02-26 LAB — MAGNESIUM: Magnesium: 1.7 mg/dL (ref 1.5–2.5)

## 2014-02-26 MED ORDER — METHYLPHENIDATE HCL 20 MG PO TABS: ORAL_TABLET | ORAL | Status: DC

## 2014-02-26 NOTE — Patient Instructions (Addendum)
Recommend the book "The END of DIETING" by Dr Baker Janus   and the book "The END of DIABETES " by Dr Excell Seltzer  At [email or web address] - get book & Audio CD's      Being diabetic has a  300% increased risk for heart attack, stroke, cancer, and alzheimer- type vascular dementia. It is very important that you work harder with diet by avoiding all foods that are white except chicken & fish. Avoid white rice (brown & wild rice is OK), white potatoes (sweetpotatoes in moderation is OK), White bread or wheat bread or anything made out of white flour like bagels, donuts, rolls, buns, biscuits, cakes, pastries, cookies, pizza crust, and pasta (made from white flour & egg whites) - vegetarian pasta or spinach or wheat pasta is OK. Multigrain breads like Arnold's or [employer], or multigrain sandwich thins or flatbreads.  Diet, exercise and weight loss can reverse and cure diabetes in the early stages.  Diet, exercise and weight loss is very important in the control and prevention of complications of diabetes which affects every system in your body, ie. Brain - dementia/stroke, eyes - glaucoma/blindness, heart - heart attack/heart failure, kidneys - dialysis, stomach - gastric paralysis, intestines - malabsorption, nerves - severe painful neuritis, circulation - gangrene & loss of a leg(s), and finally cancer and Alzheimers.    I recommend avoid fried & greasy foods,  sweets/candy, white rice (brown or wild rice or Quinoa is OK), white potatoes (sweet potatoes are OK) - anything made from white flour - bagels, doughnuts, rolls, buns, biscuits,white and wheat breads, pizza crust and traditional pasta made of white flour & egg white(vegetarian pasta or spinach or wheat pasta is OK).  Multi-grain bread is OK - like multi-grain flat bread or sandwich thins. Avoid alcohol in excess. Exercise is also important.    Eat all the vegetables you want - avoid meat, especially red meat and dairy - especially cheese.  Cheese  is the most concentrated form of trans-fats which is the worst thing to clog up our arteries. Veggie cheese is OK which can be found in the fresh produce section at [employer] or [employer] or [employer]  Attention Deficit Hyperactivity Disorder Attention deficit hyperactivity disorder (ADHD) is a problem with behavior issues based on the way the brain functions (neurobehavioral disorder). It is a common reason for behavior and academic problems in school. SYMPTOMS  There are 3 types of ADHD. The 3 types and some of the symptoms include:  Inattentive.  Gets bored or distracted easily.  Loses or forgets things. Forgets to hand in homework.  Has trouble organizing or completing tasks.  Difficulty staying on task.  An inability to organize daily tasks and school work.  Leaving projects, chores, or homework unfinished.  Trouble paying attention or responding to details. Careless mistakes.  Difficulty following directions. Often seems like is not listening.  Dislikes activities that require sustained attention (like chores or homework).  Hyperactive-impulsive.  Feels like it is impossible to sit still or stay in a seat. Fidgeting with hands and feet.  Trouble waiting turn.  Talking too much or out of turn. Interruptive.  Speaks or acts impulsively.  Aggressive, disruptive behavior.  Constantly busy or on the go; noisy.  Often leaves seat when they are expected to remain seated.  Often runs or climbs where it is not appropriate, or feels very restless.  Combined.  Has symptoms of both of the above. Often children with ADHD feel discouraged about themselves  and with school. They often perform well below their abilities in school. As children get older, the excess motor activities can calm down, but the problems with paying attention and staying organized persist. Most children do not outgrow ADHD but with good treatment can learn to cope with the symptoms. DIAGNOSIS   When ADHD is suspected, the diagnosis should be made by professionals trained in ADHD. This professional will collect information about the individual suspected of having ADHD. Information must be collected from various settings where the person lives, works, or attends school.  Diagnosis will include:  Confirming symptoms began in childhood.  Ruling out other reasons for the child's behavior.  The health care providers will check with the child's school and check their medical records.  They will talk to teachers and parents.  Behavior rating scales for the child will be filled out by those dealing with the child on a daily basis. A diagnosis is made only after all information has been considered. TREATMENT  Treatment usually includes behavioral treatment, tutoring or extra support in school, and stimulant medicines. Because of the way a person's brain works with ADHD, these medicines decrease impulsivity and hyperactivity and increase attention. This is different than how they would work in a person who does not have ADHD. Other medicines used include antidepressants and certain blood pressure medicines. Most experts agree that treatment for ADHD should address all aspects of the person's functioning. Along with medicines, treatment should include structured classroom management at school. Parents should reward good behavior, provide constant discipline, and set limits. Tutoring should be available for the child as needed. ADHD is a lifelong condition. If untreated, the disorder can have long-term serious effects into adolescence and adulthood. HOME CARE INSTRUCTIONS   Often with ADHD there is a lot of frustration among family members dealing with the condition. Blame and anger are also feelings that are common. In many cases, because the problem affects the family as a whole, the entire family may need help. A therapist can help the family find better ways to handle the disruptive behaviors  of the person with ADHD and promote change. If the person with ADHD is young, most of the therapist's work is with the parents. Parents will learn techniques for coping with and improving their child's behavior. Sometimes only the child with the ADHD needs counseling. Your health care providers can help you make these decisions.  Children with ADHD may need help learning how to organize. Some helpful tips include:  Keep routines the same every day from wake-up time to bedtime. Schedule all activities, including homework and playtime. Keep the schedule in a place where the person with ADHD will often see it. Mark schedule changes as far in advance as possible.  Schedule outdoor and indoor recreation.  Have a place for everything and keep everything in its place. This includes clothing, backpacks, and school supplies.  Encourage writing down assignments and bringing home needed books. Work with your child's teachers for assistance in organizing school work.  Offer your child a well-balanced diet. Breakfast that includes a balance of whole grains, protein, and fruits or vegetables is especially important for school performance. Children should avoid drinks with caffeine including:  Soft drinks.  Coffee.  Tea.  However, some older children (adolescents) may find these drinks helpful in improving their attention. Because it can also be common for adolescents with ADHD to become addicted to caffeine, talk with your health care provider about what is a safe amount of caffeine  intake for your child.  Children with ADHD need consistent rules that they can understand and follow. If rules are followed, give small rewards. Children with ADHD often receive, and expect, criticism. Look for good behavior and praise it. Set realistic goals. Give clear instructions. Look for activities that can foster success and self-esteem. Make time for pleasant activities with your child. Give lots of affection.  Parents  are their children's greatest advocates. Learn as much as possible about ADHD. This helps you become a stronger and better advocate for your child. It also helps you educate your child's teachers and instructors if they feel inadequate in these areas. Parent support groups are often helpful. A national group with local chapters is called Children and Adults with Attention Deficit Hyperactivity Disorder (CHADD). SEEK MEDICAL CARE IF:  Your child has repeated muscle twitches, cough, or speech outbursts.  Your child has sleep problems.  Your child has a marked loss of appetite.  Your child develops depression.  Your child has new or worsening behavioral problems.  Your child develops dizziness.  Your child has a racing heart.  Your child has stomach pains.  Your child develops headaches. SEEK IMMEDIATE MEDICAL CARE IF:  Your child has been diagnosed with depression or anxiety and the symptoms seem to be getting worse.  Your child has been depressed and suddenly appears to have increased energy or motivation.  You are worried that your child is having a bad reaction to a medication he or she is taking for ADHD. Document Released: 07/22/2002 Document Revised: 08/06/2013 Document Reviewed: 04/08/2013 [facility] Patient Information 2015 [place], [place]. This information is not intended to replace advice given to you by your health care provider. Make sure you discuss any questions you have with your health care provider.

## 2014-02-26 NOTE — Progress Notes (Signed)
Patient ID: Sara Cross, female   DOB: [birth year], 58 y.o.   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 Today patient is c/o difficulty focusing and concentrating and staying on task and unable to get any thing accomplished.   HTN predates about 42 years since she was first told she had elevated BP at age 64 yo. BP has been controlled and today's BP: 116/68 mmHg. Patient had recent neg cardiac w/u by Dr Fransico Him  with neg Myoview, Holter and 2DEC, Patient denies any cardiac type chest pain, palpitations, dyspnea/orthopnea/PND, dizziness, claudication, or dependent edema.   Hyperlipidemia is controlled with diet & meds. Patient denies myalgias or other med SE's. 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 Patient denies any symptoms of reactive hypoglycemia, diabetic polys, paresthesias or visual blurring, but does have decreased sensation of her feet.   Further, Patient has history of Vitamin D Deficiency   and last vitamin D was 70 in Mar 2015.  Patient supplements vitamin D without any suspected side-effects.  Medication List   ALPRAZolam 1 MG tablet  Commonly known as:  XANAX  TAKE ONE-HALF TO ONE TABLET BY MOUTH TWICE DAILY.     citalopram 40 MG tablet  Commonly known as:  CELEXA  Take 1 tablet (40 mg total) by mouth daily.     cyclobenzaprine 10 MG tablet  Commonly known as:  FLEXERIL  Take 1 tablet (10 mg total) by mouth at bedtime.     furosemide 20 MG tablet  Commonly known as:  LASIX  Take 2 tablets (40 mg total) by mouth daily.     gabapentin 100 MG capsule   Commonly known as:  NEURONTIN  Take 1 capsule (100 mg total) by mouth 3 (three) times daily.     lisinopril 40 MG tablet  Commonly known as:  PRINIVIL,ZESTRIL  TAKE ONE TABLET BY MOUTH ONCE DAILY     MAGNESIUM PO  Take 250 mg by mouth daily.     metFORMIN 500 MG 24 hr tablet  Commonly known as:  GLUCOPHAGE-XR  Take 2 tablets (1,000 mg total) by mouth 2 (two) times daily.     omeprazole 20 MG capsule  Commonly known as:  PRILOSEC  Take 1 capsule (20 mg total) by mouth daily.     oxyCODONE-acetaminophen 5-325 MG per tablet  Commonly known as:  PERCOCET/ROXICET  Take 1 tablet by mouth every 6 (six) hours as needed for pain.     SUPER B COMPLEX PO  Take 1 tablet by mouth daily.     VITAMIN A EX  Apply topically daily.     VITAMIN D PO  Take 1 capsule by mouth daily.     vitamin E 400 UNIT capsule  Generic drug:  vitamin E  Take 800 Units by mouth daily.        Allergies  Allergen Reactions  . Benicar [Olmesartan]   . Lortab [Hydrocodone-Acetaminophen] Itching  . Ziac [Bisoprolol-Hydrochlorothiazide]    PMHx:   Past Medical History  Diagnosis Date  . Depression   .  Pneumonia     hx of  . Diabetes mellitus     takes metformin daily  . Arthritis   . Morbid obesity   . Anxiety   . Sleep apnea     wear cpap nightly  . GERD (gastroesophageal reflux disease)   . Headache(784.0)   . Dizziness   . DDD (degenerative disc disease)   . Hypertension   . Hyperlipidemia    FHx:    Reviewed / unchanged  SHx:    Reviewed / unchanged  Systems Review:  Constitutional: Denies fever, chills, wt changes, headaches, insomnia, fatigue, night sweats, change in appetite. Eyes: Denies redness, blurred vision, diplopia, discharge, itchy, watery eyes.  ENT: Denies discharge, congestion, post nasal drip, epistaxis, sore throat, earache, hearing loss, dental pain, tinnitus, vertigo, sinus pain, snoring.  CV: Denies chest pain, palpitations, irregular heartbeat, syncope, dyspnea,  diaphoresis, orthopnea, PND, claudication or edema. Respiratory: denies cough, dyspnea, DOE, pleurisy, hoarseness, laryngitis, wheezing.  Gastrointestinal: Denies dysphagia, odynophagia, heartburn, reflux, water brash, abdominal pain or cramps, nausea, vomiting, bloating, diarrhea, constipation, hematemesis, melena, hematochezia  or hemorrhoids. Genitourinary: Denies dysuria, frequency, urgency, nocturia, hesitancy, discharge, hematuria or flank pain. Musculoskeletal: Denies arthralgias, myalgias, stiffness, jt. swelling, pain, limping or strain/sprain.  Skin: Denies pruritus, rash, hives, warts, acne, eczema or change in skin lesion(s). Neuro: No weakness, tremor, incoordination, spasms, paresthesia or pain. Psychiatric: Denies confusion, memory loss or sensory loss. Endo: Denies change in weight, skin or hair change.  Heme/Lymph: No excessive bleeding, bruising or enlarged lymph nodes.  Exam:  BP 116/68  Pulse 80  Temp(Src) 97.9 F (36.6 C) (Temporal)  Resp 18  Ht 5\' 2"  (1.575 m)  Wt 217 lb (98.431 kg)  BMI 39.68 kg/m2  Appears well nourished and in no distress. Eyes: PERRLA, EOMs, conjunctiva no swelling or erythema. Sinuses: No frontal/maxillary tenderness ENT/Mouth: EAC's clear, TM's nl w/o erythema, bulging. Nares clear w/o erythema, swelling, exudates. Oropharynx clear without erythema or exudates. Oral hygiene is good. Tongue normal, non obstructing. Hearing intact.  Neck: Supple. Thyroid nl. Car 2+/2+ without bruits, nodes or JVD. Chest: Respirations nl with BS clear & equal w/o rales, rhonchi, wheezing or stridor.  Cor: Heart sounds normal w/ regular rate and rhythm without sig. murmurs, gallops, clicks, or rubs. Peripheral pulses normal and equal  without edema.  Abdomen: Soft & bowel sounds normal. Non-tender w/o guarding, rebound, hernias, masses, or organomegaly.  Lymphatics: Unremarkable.  Musculoskeletal: Full ROM all peripheral extremities, joint stability, 5/5  strength, and normal gait.  Skin: Warm, dry without exposed rashes, lesions or ecchymosis apparent.  Neuro: Cranial nerves intact, reflexes equal bilaterally. Sensory-motor testing grossly intact. Tendon reflexes grossly intact.  Pysch: Alert & oriented x 3. Insight and judgement nl & appropriate. No ideations.  Assessment and Plan:  1. Hypertension - Continue monitor blood pressure at home. Continue diet/meds same.  2. Hyperlipidemia - Continue diet/meds, exercise,& lifestyle modifications. Continue monitor periodic cholesterol/liver & renal functions   3. T2_NIDDM w/ Neuropathy - continue recommend prudent low glycemic diet, weight control, regular exercise, diabetic monitoring and periodic eye exams.  4. Vitamin D Deficiency - Continue supplementation.  5. ADD - Try Ritalin  6. Morbid Obesity - recc books "The End of Dieting" & "The End of Diabetes" by Dr Excell Seltzer  Recommended regular exercise, BP monitoring, weight control, and discussed med and SE's. Recommended labs to assess and monitor clinical status. Further disposition pending results of labs.

## 2014-02-27 LAB — INSULIN, RANDOM: Insulin: 28 u[IU]/mL (ref 3–28)

## 2014-04-02 ENCOUNTER — Ambulatory Visit (INDEPENDENT_AMBULATORY_CARE_PROVIDER_SITE_OTHER): Payer: Medicare Other | Admitting: Internal Medicine

## 2014-04-02 ENCOUNTER — Encounter: Payer: Self-pay | Admitting: Internal Medicine

## 2014-04-02 VITALS — BP 138/86 | HR 98 | Temp 98.2°F | Resp 18 | Ht 62.0 in | Wt 220.0 lb

## 2014-04-02 DIAGNOSIS — F988 Other specified behavioral and emotional disorders with onset usually occurring in childhood and adolescence: Secondary | ICD-10-CM

## 2014-04-02 MED ORDER — AMPHETAMINE-DEXTROAMPHETAMINE 20 MG PO TABS: ORAL_TABLET | ORAL | Status: DC

## 2014-04-02 NOTE — Progress Notes (Signed)
   Subjective:    Patient ID: Sara Cross, female    DOB: [birth year], 58 y.o.   MRN: [id number]  HPI Patient returns for 1 month f/u after treatment on Ritalin for her ADD. She feels she has had no improvement on the ritalin and desires to try something else. She continues to have ongoing problems with focusing & concentrating.   Medication List   ALPRAZolam 1 MG tablet  TAKE ONE-HALF TO ONE TABLET BY MOUTH TWICE DAILY.     citalopram 40 MG tablet  Take 1 tablet (40 mg total) by mouth daily.     cyclobenzaprine 10 MG tablet  Commonly known as:  FLEXERIL     furosemide 20 MG tablet  Take 2 tablets (40 mg total) by mouth daily.     gabapentin 100 MG capsule  Take 1 capsule (100 mg total) by mouth 3 (three) times daily.     lisinopril 40 MG tablet  TAKE ONE TABLET BY MOUTH ONCE DAILY     MAGNESIUM PO  Take 250 mg by mouth daily.     metFORMIN 500 MG 24 hr tablet  Take 2 tablets (1,000 mg total) by mouth 2 (two) times daily.     omeprazole 20 MG capsule  Take 1 capsule (20 mg total) by mouth daily.     oxyCODONE-acetaminophen 5-325 MG per tablet  Take 1 tablet by mouth every 6 (six) hours as needed for pain.     SUPER B COMPLEX PO  Take 1 tablet by mouth daily.     VITAMIN A EX  Apply topically daily.     VITAMIN D PO  Take 1 capsule by mouth daily.     vitamin E 400 UNIT capsule  Generic drug:  vitamin E  Take 800 Units by mouth daily.     Allergies  Allergen Reactions  . Benicar [Olmesartan]   . Lortab [Hydrocodone-Acetaminophen] Itching  . Ziac [Bisoprolol-Hydrochlorothiazide]    Past Medical History  Diagnosis Date  . Depression   . Pneumonia     hx of  . Diabetes mellitus     takes metformin daily  . Arthritis   . Morbid obesity   . Anxiety   . Sleep apnea     wear cpap nightly  . GERD (gastroesophageal reflux disease)   . Headache(784.0)   . Dizziness   . DDD (degenerative disc disease)   . Hypertension   . Hyperlipidemia    Review of  Systems non contributory to above.  Objective:   Physical Exam BP 138/86  Pulse 98  Temp(Src) 98.2 F (36.8 C) (Temporal)  Resp 18  Ht 5\' 2"  (1.575 m)  Wt 220 lb (99.791 kg)  BMI 40.23 kg/m2  HEENT - Eac's patent. TM's Nl. EOM's full. PERRLA. NasoOroPharynx clear. Neck - supple. Nl Thyroid. Carotids 2+ & No bruits, nodes, JVD Chest - Clear equal BS w/o Rales, rhonchi, wheezes. Cor - Nl HS. RRR w/o sig MGR. PP 1(+). No edema. Neuro - No obvious Cr N abnormalities. Sensory, motor and Cerebellar functions appear Nl w/o focal abnormalities. Psyche - Mental status normal & appropriate.  No delusions, ideations or obvious mood abnormalities.  Assessment & Plan:   1. ADD (attention deficit disorder)  - amphetamine-dextroamphetamine (ADDERALL) 20 MG tablet; Take 1/2 to 1 tablet 2 or 3 x day if needed for ADD  Dispense: 90 tablet; Refill: 0

## 2014-04-22 ENCOUNTER — Other Ambulatory Visit: Payer: Self-pay | Admitting: Emergency Medicine

## 2014-04-25 ENCOUNTER — Ambulatory Visit: Payer: Medicare Other | Admitting: Physical Therapy

## 2014-04-30 ENCOUNTER — Ambulatory Visit: Payer: Medicare Other | Attending: Neurosurgery | Admitting: Physical Therapy

## 2014-04-30 DIAGNOSIS — M25559 Pain in unspecified hip: Secondary | ICD-10-CM | POA: Diagnosis not present

## 2014-04-30 DIAGNOSIS — M545 Low back pain, unspecified: Secondary | ICD-10-CM | POA: Insufficient documentation

## 2014-04-30 DIAGNOSIS — IMO0001 Reserved for inherently not codable concepts without codable children: Secondary | ICD-10-CM | POA: Diagnosis not present

## 2014-05-05 ENCOUNTER — Ambulatory Visit: Payer: Medicare Other | Admitting: Physical Therapy

## 2014-05-05 DIAGNOSIS — IMO0001 Reserved for inherently not codable concepts without codable children: Secondary | ICD-10-CM | POA: Diagnosis not present

## 2014-05-08 ENCOUNTER — Ambulatory Visit: Payer: Medicare Other | Admitting: Physical Therapy

## 2014-05-08 DIAGNOSIS — IMO0001 Reserved for inherently not codable concepts without codable children: Secondary | ICD-10-CM | POA: Diagnosis not present

## 2014-05-09 ENCOUNTER — Encounter: Payer: Medicare Other | Admitting: Physical Therapy

## 2014-05-13 ENCOUNTER — Ambulatory Visit: Payer: Medicare Other | Admitting: Physical Therapy

## 2014-05-13 DIAGNOSIS — IMO0001 Reserved for inherently not codable concepts without codable children: Secondary | ICD-10-CM | POA: Diagnosis not present

## 2014-05-17 ENCOUNTER — Other Ambulatory Visit: Payer: Self-pay | Admitting: Emergency Medicine

## 2014-05-21 ENCOUNTER — Ambulatory Visit: Payer: Medicare Other | Attending: Neurosurgery | Admitting: Physical Therapy

## 2014-05-21 DIAGNOSIS — I10 Essential (primary) hypertension: Secondary | ICD-10-CM | POA: Insufficient documentation

## 2014-05-21 DIAGNOSIS — M25559 Pain in unspecified hip: Secondary | ICD-10-CM | POA: Diagnosis not present

## 2014-05-21 DIAGNOSIS — E1142 Type 2 diabetes mellitus with diabetic polyneuropathy: Secondary | ICD-10-CM | POA: Diagnosis not present

## 2014-05-21 DIAGNOSIS — M6281 Muscle weakness (generalized): Secondary | ICD-10-CM | POA: Insufficient documentation

## 2014-05-21 DIAGNOSIS — Z9889 Other specified postprocedural states: Secondary | ICD-10-CM | POA: Insufficient documentation

## 2014-05-21 DIAGNOSIS — M5440 Lumbago with sciatica, unspecified side: Secondary | ICD-10-CM | POA: Insufficient documentation

## 2014-05-28 ENCOUNTER — Ambulatory Visit: Payer: Medicare Other | Admitting: Physical Therapy

## 2014-06-04 ENCOUNTER — Ambulatory Visit: Payer: Medicare Other | Admitting: Physical Therapy

## 2014-06-04 DIAGNOSIS — M5440 Lumbago with sciatica, unspecified side: Secondary | ICD-10-CM | POA: Diagnosis not present

## 2014-06-06 ENCOUNTER — Ambulatory Visit (INDEPENDENT_AMBULATORY_CARE_PROVIDER_SITE_OTHER): Payer: Medicare Other | Admitting: Physician Assistant

## 2014-06-06 ENCOUNTER — Encounter: Payer: Self-pay | Admitting: Physician Assistant

## 2014-06-06 VITALS — BP 132/82 | HR 84 | Temp 97.6°F | Resp 16 | Wt 206.0 lb

## 2014-06-06 DIAGNOSIS — F419 Anxiety disorder, unspecified: Secondary | ICD-10-CM

## 2014-06-06 DIAGNOSIS — I10 Essential (primary) hypertension: Secondary | ICD-10-CM

## 2014-06-06 DIAGNOSIS — G4733 Obstructive sleep apnea (adult) (pediatric): Secondary | ICD-10-CM

## 2014-06-06 DIAGNOSIS — K219 Gastro-esophageal reflux disease without esophagitis: Secondary | ICD-10-CM

## 2014-06-06 DIAGNOSIS — F988 Other specified behavioral and emotional disorders with onset usually occurring in childhood and adolescence: Secondary | ICD-10-CM

## 2014-06-06 DIAGNOSIS — F32A Depression, unspecified: Secondary | ICD-10-CM

## 2014-06-06 DIAGNOSIS — Z720 Tobacco use: Secondary | ICD-10-CM

## 2014-06-06 DIAGNOSIS — F329 Major depressive disorder, single episode, unspecified: Secondary | ICD-10-CM

## 2014-06-06 DIAGNOSIS — F17200 Nicotine dependence, unspecified, uncomplicated: Secondary | ICD-10-CM

## 2014-06-06 DIAGNOSIS — E782 Mixed hyperlipidemia: Secondary | ICD-10-CM

## 2014-06-06 DIAGNOSIS — E559 Vitamin D deficiency, unspecified: Secondary | ICD-10-CM

## 2014-06-06 DIAGNOSIS — Z23 Encounter for immunization: Secondary | ICD-10-CM

## 2014-06-06 DIAGNOSIS — Z79899 Other long term (current) drug therapy: Secondary | ICD-10-CM

## 2014-06-06 DIAGNOSIS — F909 Attention-deficit hyperactivity disorder, unspecified type: Secondary | ICD-10-CM

## 2014-06-06 DIAGNOSIS — E1149 Type 2 diabetes mellitus with other diabetic neurological complication: Secondary | ICD-10-CM

## 2014-06-06 DIAGNOSIS — E1142 Type 2 diabetes mellitus with diabetic polyneuropathy: Secondary | ICD-10-CM

## 2014-06-06 LAB — CBC WITH DIFFERENTIAL/PLATELET
BASOS PCT: 0 % (ref 0–1)
Basophils Absolute: 0 10*3/uL (ref 0.0–0.1)
Eosinophils Absolute: 0.4 10*3/uL (ref 0.0–0.7)
Eosinophils Relative: 6 % — ABNORMAL HIGH (ref 0–5)
HCT: 40.8 % (ref 36.0–46.0)
Hemoglobin: 13.7 g/dL (ref 12.0–15.0)
LYMPHS PCT: 27 % (ref 12–46)
Lymphs Abs: 1.7 10*3/uL (ref 0.7–4.0)
MCH: 30.6 pg (ref 26.0–34.0)
MCHC: 33.6 g/dL (ref 30.0–36.0)
MCV: 91.1 fL (ref 78.0–100.0)
Monocytes Absolute: 0.5 10*3/uL (ref 0.1–1.0)
Monocytes Relative: 8 % (ref 3–12)
NEUTROS PCT: 59 % (ref 43–77)
Neutro Abs: 3.7 10*3/uL (ref 1.7–7.7)
Platelets: 259 10*3/uL (ref 150–400)
RBC: 4.48 MIL/uL (ref 3.87–5.11)
RDW: 15.1 % (ref 11.5–15.5)
WBC: 6.2 10*3/uL (ref 4.0–10.5)

## 2014-06-06 LAB — TSH: TSH: 1.75 u[IU]/mL (ref 0.350–4.500)

## 2014-06-06 LAB — LIPID PANEL
Cholesterol: 174 mg/dL (ref 0–200)
HDL: 47 mg/dL (ref >39)
LDL CALC: 70 mg/dL (ref 0–99)
Total CHOL/HDL Ratio: 3.7 Ratio
Triglycerides: 287 mg/dL — ABNORMAL HIGH (ref <150)
VLDL: 57 mg/dL — AB (ref 0–40)

## 2014-06-06 LAB — BASIC METABOLIC PANEL WITH GFR
BUN: 12 mg/dL (ref 6–23)
CO2: 26 meq/L (ref 19–32)
Calcium: 9.6 mg/dL (ref 8.4–10.5)
Chloride: 100 mEq/L (ref 96–112)
Creat: 0.66 mg/dL (ref 0.50–1.10)
GFR, Est Non African American: >89 mL/min
GLUCOSE: 172 mg/dL — AB (ref 70–99)
POTASSIUM: 4.1 meq/L (ref 3.5–5.3)
SODIUM: 136 meq/L (ref 135–145)

## 2014-06-06 LAB — HEPATIC FUNCTION PANEL
ALT: 13 U/L (ref 0–35)
AST: 12 U/L (ref 0–37)
Albumin: 4 g/dL (ref 3.5–5.2)
Alkaline Phosphatase: 104 U/L (ref 39–117)
BILIRUBIN DIRECT: 0.1 mg/dL (ref 0.0–0.3)
BILIRUBIN INDIRECT: 0.2 mg/dL (ref 0.2–1.2)
Total Bilirubin: 0.3 mg/dL (ref 0.2–1.2)
Total Protein: 6.4 g/dL (ref 6.0–8.3)

## 2014-06-06 LAB — MAGNESIUM: Magnesium: 1.7 mg/dL (ref 1.5–2.5)

## 2014-06-06 MED ORDER — AMPHETAMINE-DEXTROAMPHETAMINE 20 MG PO TABS: ORAL_TABLET | ORAL | Status: DC

## 2014-06-06 MED ORDER — BUPROPION HCL ER (XL) 150 MG PO TB24: 150.0000 mg | ORAL_TABLET | ORAL | Status: DC

## 2014-06-06 MED ORDER — BENZONATATE 100 MG PO CAPS: 100.0000 mg | ORAL_CAPSULE | Freq: Four times a day (QID) | ORAL | Status: DC | PRN

## 2014-06-06 MED ORDER — AZITHROMYCIN 250 MG PO TABS: ORAL_TABLET | ORAL | Status: AC

## 2014-06-06 MED ORDER — FLUTICASONE FUROATE-VILANTEROL 100-25 MCG/INH IN AEPB: INHALATION_SPRAY | RESPIRATORY_TRACT | Status: DC

## 2014-06-06 NOTE — Progress Notes (Signed)
Assessment and Plan:  Hypertension: Continue medication, monitor blood pressure at home. Continue DASH diet.  Reminder to go to the ER if any CP, SOB, nausea, dizziness, severe HA, changes vision/speech, left arm numbness and tingling and jaw pain. Will get on bASA Cholesterol: Continue diet and exercise. Check cholesterol.  Diabetes-Continue diet and exercise. Check A1C Vitamin D Def- check level and continue medications.  Obesity with co morbidities- long discussion about weight loss, diet, and exercise Cough/possible COPD- Breo samples and script given, smoking cessation discussed, zpak, follow up if not better, needs CXR  Smoking cessation- discussed with patient, understands risk of MI, stroke, cancer, and death.    Continue diet and meds as discussed. Further disposition pending results of labs. Discussed med's effects and SE's.  OVER 40 minutes of exam, counseling, chart review, referral performed   HPI 58 y.o. female  presents for 3 month follow up with hypertension, hyperlipidemia, diabetes and vitamin D. Her blood pressure has been controlled at home, today their BP is BP: 132/82 mmHg She does not workout. She denies chest pain, shortness of breath, dizziness.  She is not on cholesterol medication and denies myalgias. Her cholesterol is not at goal. The cholesterol last visit was:   Lab Results  Component Value Date   CHOL 184 02/26/2014   HDL 54 02/26/2014   LDLCALC 85 02/26/2014   TRIG 224* 02/26/2014   CHOLHDL 3.4 02/26/2014   She has been working on diet and exercise for Diabetes, she has p. Neuropathy not on gabapentin, not on bASA, she is on an ACE, and on metformin, sugar runs 120's , and denies nausea, polydipsia, polyuria and visual disturbances. Last A1C in the office was:  Lab Results  Component Value Date   HGBA1C 6.8* 02/26/2014   Patient is on Vitamin D supplement. Lab Results  Component Value Date   VD25OH 68 10/25/2013     Patient is on an ADD medication, she  states that the medication is helping and she denies any adverse reactions.  She has had a cough/sinus issues for the last 3 weeks.  Current every day smoker.  She has chronic back pain and sees her orthopedic for pain management.  BMI is Body mass index is 37.67 kg/(m^2)., she is working on diet and exercise and has done well. She has cut back on her carbs.   Wt Readings from Last 3 Encounters:  06/06/14 206 lb (93.441 kg)  04/02/14 220 lb (99.791 kg)  02/26/14 217 lb (98.431 kg)     Current Medications:  Current Outpatient Prescriptions on File Prior to Visit  Medication Sig Dispense Refill  . ALPRAZolam (XANAX) 1 MG tablet TAKE ONE TABLET BY MOUTH TWICE DAILY  60 tablet  1  . B Complex-C (SUPER B COMPLEX PO) Take 1 tablet by mouth daily.      . Cholecalciferol (VITAMIN D PO) Take 1 capsule by mouth daily.      . cyclobenzaprine (FLEXERIL) 10 MG tablet Take 1 tablet (10 mg total) by mouth at bedtime.  90 tablet  0  . furosemide (LASIX) 20 MG tablet Take 2 tablets (40 mg total) by mouth daily.  180 tablet  1  . lisinopril (PRINIVIL,ZESTRIL) 40 MG tablet TAKE ONE TABLET BY MOUTH ONCE DAILY  30 tablet  5  . MAGNESIUM PO Take 250 mg by mouth daily.      . metFORMIN (GLUCOPHAGE-XR) 500 MG 24 hr tablet TAKE TWO TABLETS BY MOUTH TWICE DAILY  360 tablet  5  .  omeprazole (PRILOSEC) 20 MG capsule Take 1 capsule (20 mg total) by mouth daily.  90 capsule  1  . oxyCODONE-acetaminophen (PERCOCET/ROXICET) 5-325 MG per tablet Take 1 tablet by mouth every 6 (six) hours as needed for pain.  60 tablet  0  . VITAMIN A EX Apply topically daily.      . vitamin E (VITAMIN E) 400 UNIT capsule Take 800 Units by mouth daily.      Marland Kitchen amphetamine-dextroamphetamine (ADDERALL) 20 MG tablet Take 1/2 to 1 tablet 2 or 3 x day if needed for ADD  90 tablet  0   No current facility-administered medications on file prior to visit.   Medical History:  Past Medical History  Diagnosis Date  . Depression   . Pneumonia      hx of  . Diabetes mellitus     takes metformin daily  . Arthritis   . Morbid obesity   . Anxiety   . Sleep apnea     wear cpap nightly  . GERD (gastroesophageal reflux disease)   . Headache(784.0)   . Dizziness   . DDD (degenerative disc disease)   . Hypertension   . Hyperlipidemia    Allergies:  Allergies  Allergen Reactions  . Benicar [Olmesartan]   . Lortab [Hydrocodone-Acetaminophen] Itching  . Ziac [Bisoprolol-Hydrochlorothiazide]     Review of Systems: [X]  = complains of  [ ]  = denies  General: Fatigue [ ]  Fever [ ]  Chills [ ]  Weakness [ ]   Insomnia [ ]  Eyes: Redness [ ]  Blurred vision [ ]  Diplopia [ ]   ENT: Congestion [x ] Sinus Pain [x ] Post Nasal Drip [ x] Sore Throat [ ]  Earache [ ]   Cardiac: Chest pain/pressure [ ]  SOB [ ]  Orthopnea [ ]   Palpitations [ ]   Paroxysmal nocturnal dyspnea[ ]  Claudication [ ]  Edema [ ]   Pulmonary: Cough [x ] Wheezing[ x]  SOB [ ]   Snoring [ ]   GI: Nausea [ ]  Vomiting[ ]  Dysphagia[ ]  Heartburn[ ]  Abdominal pain [ ]  Constipation [ ] ; Diarrhea [ ] ; BRBPR [ ]  Melena[ ]  GU: Hematuria[ ]  Dysuria [ ]  Nocturia[ ]  Urgency [ ]   Hesitancy [ ]  Discharge [ ]  Neuro: Headaches[ ]  Vertigo[ ]  Paresthesias[ ]  Spasm [ ]  Speech changes [ ]  Incoordination [ ]   Ortho: Arthritis [ ]  Joint pain [ ]  Muscle pain [ ]  Joint swelling [ ]  Back Pain [ ]  Skin:  Rash [ ]   Pruritis [ ]  Change in skin lesion [ ]   Psych: Depression[ ]  Anxiety[ ]  Confusion [ ]  Memory loss [ ]   Heme/Lypmh: Bleeding [ ]  Bruising [ ]  Enlarged lymph nodes [ ]   Endocrine: Visual blurring [ ]  Paresthesia [ ]  Polyuria [ ]  Polydypsea [ ]    Heat/cold intolerance [ ]  Hypoglycemia [ ]   Family history- Review and unchanged Social history- Review and unchanged Physical Exam: BP 132/82  Pulse 84  Temp(Src) 97.6 F (36.4 C)  Resp 16  Wt 206 lb (93.441 kg) Wt Readings from Last 3 Encounters:  06/06/14 206 lb (93.441 kg)  04/02/14 220 lb (99.791 kg)  02/26/14 217 lb (98.431 kg)   General  Appearance: Well nourished, in no apparent distress. Eyes: PERRLA, EOMs, conjunctiva no swelling or erythema Sinuses: + Frontal/maxillary tenderness ENT/Mouth: Ext aud canals clear, TMs without erythema, bulging. No erythema, swelling, or exudate on post pharynx.  Tonsils not swollen or erythematous. Hearing normal.  Neck: Supple, thyroid normal.  Respiratory: Respiratory effort normal, BS equal bilaterally with diffuse  wheezing without rales, rhonchi,  or stridor.  Cardio: RRR with no MRGs. Brisk peripheral pulses without edema.  Abdomen: Soft, + BS.  Non tender, no guarding, rebound, hernias, masses. Lymphatics: Non tender without lymphadenopathy.  Musculoskeletal: Full ROM, 5/5 strength, normal gait.  Skin: Warm, dry without rashes, lesions, ecchymosis.  Neuro: Cranial nerves intact. No cerebellar symptoms. Sensation intact.  Psych: Awake and oriented X 3, normal affect, Insight and Judgment appropriate.    Vicie Mutters, PA-C 9:15 AM [facility]

## 2014-06-06 NOTE — Patient Instructions (Signed)
Bad carbs also include fruit juice, alcohol, and sweet tea. These are empty calories that do not signal to your brain that you are full.   Please remember the good carbs are still carbs which convert into sugar. So please measure them out no more than 1/2-1 cup of rice, oatmeal, pasta, and beans.  Veggies are however free foods! Pile them on.   I like lean protein at every meal such as chicken, [place], pork chops, cottage cheese, etc. Just do not fry these meats and please center your meal around vegetable, the meats should be a side dish.   No all fruit is created equal. Please see the list below, the fruit at the bottom is higher in sugars than the fruit at the top   Recommendations For Diabetic Patients:   -  Take medications as prescribed  -  Recommend Dr Fara Olden Fuhrman's book "The End of Diabetes " - Can get at  [email or web address] and encourage also get the Audio CD book  - AVOID Animal products, ie. Meat - red/white, Poultry and Dairy/especially cheese - Exercise at least 5 times a week for 30 minutes or preferably daily.  - No Smoking - Drink less than 2 drinks a day.  - Monitor your feet for sores - Have yearly Eye Exams - Recommend annual Flu vaccine  - Recommend Pneumovax and Prevnar vaccines - Shingles Vaccine (Zostavax) if over 56 y.o.  Goals:   - BMI less than 24 - Fasting sugar less than 130 or less than 150 if tapering medicines to lose weight  - Systolic BP less than 078  - Diastolic BP less than 80 - Bad LDL Cholesterol less than 70 - Triglycerides less than 150  We are giving you chantix for smoking cessation. You can do it! And we are here to help! You may have heard some scary side effects about chantix, the three most common I hear about are nausea, crazy dreams and depression.  However, I like for my patients to try to stay on 1/2 a tablet twice a day rather than one tablet twice a day as normally prescribed. This helps decrease the chances of side effects and  helps save money by making a one month prescription last two months  Please start the prescription this way:  Start 1/2 tablet by mouth once daily after food with a full glass of water for 3 days Then do 1/2 tablet by mouth twice daily for 4 days.  At this point we have several options: 1) continue on 1/2 tablet twice a day- which I encourage you to do. You can stay on this dose the rest of the time on the medication or if you still feel the need to smoke you can do one of the two options below. 2) do one tablet in the morning and 1/2 in the evening which helps decrease dreams. 3) do one tablet twice a day.   What if I miss a dose? If you miss a dose, take it as soon as you can. If it is almost time for your next dose, take only that dose. Do not take double or extra doses.  What should I watch for while using this medicine? Visit your doctor or health care professional for regular check ups. Ask for ongoing advice and encouragement from your doctor or healthcare professional, friends, and family to help you quit. If you smoke while on this medication, quit again  Your mouth may get dry. Chewing sugarless gum or hard  candy, and drinking plenty of water may help. Contact your doctor if the problem does not go away or is severe.  You may get drowsy or dizzy. Do not drive, use machinery, or do anything that needs mental alertness until you know how this medicine affects you. Do not stand or sit up quickly, especially if you are an 58 patient.   The use of this medicine may increase the chance of suicidal thoughts or actions. Pay special attention to how you are responding while on this medicine. Any worsening of mood, or thoughts of suicide or dying should be reported to your health care professional right away.  ADVANTAGES OF QUITTING SMOKING  Within 20 minutes, blood pressure decreases. Your pulse is at normal level.  After 8 hours, carbon monoxide levels in the blood return to normal.  Your oxygen level increases.  After 24 hours, the chance of having a heart attack starts to decrease. Your breath, hair, and body stop smelling like smoke.  After 48 hours, damaged nerve endings begin to recover. Your sense of taste and smell improve.  After 72 hours, the body is virtually free of nicotine. Your bronchial tubes relax and breathing becomes easier.  After 2 to 12 weeks, lungs can hold more air. Exercise becomes easier and circulation improves.  After 1 year, the risk of coronary heart disease is cut in half.  After 5 years, the risk of stroke falls to the same as a nonsmoker.  After 10 years, the risk of lung cancer is cut in half and the risk of other cancers decreases significantly.  After 15 years, the risk of coronary heart disease drops, usually to the level of a nonsmoker.  You will have extra money to spend on things other than cigarettes.

## 2014-06-07 LAB — HEMOGLOBIN A1C
Hgb A1c MFr Bld: 6.4 % — ABNORMAL HIGH (ref <5.7)
MEAN PLASMA GLUCOSE: 137 mg/dL — AB (ref <117)

## 2014-06-07 LAB — VITAMIN D 25 HYDROXY (VIT D DEFICIENCY, FRACTURES): Vit D, 25-Hydroxy: 48 ng/mL (ref 30–89)

## 2014-06-11 ENCOUNTER — Ambulatory Visit: Payer: Medicare Other | Admitting: Physical Therapy

## 2014-06-11 DIAGNOSIS — M5440 Lumbago with sciatica, unspecified side: Secondary | ICD-10-CM | POA: Diagnosis not present

## 2014-06-17 ENCOUNTER — Other Ambulatory Visit: Payer: Self-pay | Admitting: Emergency Medicine

## 2014-06-26 ENCOUNTER — Other Ambulatory Visit: Payer: Self-pay | Admitting: Neurosurgery

## 2014-06-26 DIAGNOSIS — M5414 Radiculopathy, thoracic region: Secondary | ICD-10-CM

## 2014-06-26 DIAGNOSIS — M5126 Other intervertebral disc displacement, lumbar region: Secondary | ICD-10-CM

## 2014-06-26 DIAGNOSIS — R222 Localized swelling, mass and lump, trunk: Secondary | ICD-10-CM

## 2014-06-30 ENCOUNTER — Other Ambulatory Visit: Payer: Self-pay | Admitting: Neurosurgery

## 2014-06-30 DIAGNOSIS — M5126 Other intervertebral disc displacement, lumbar region: Secondary | ICD-10-CM

## 2014-06-30 DIAGNOSIS — R222 Localized swelling, mass and lump, trunk: Secondary | ICD-10-CM

## 2014-06-30 DIAGNOSIS — M5415 Radiculopathy, thoracolumbar region: Secondary | ICD-10-CM

## 2014-07-14 ENCOUNTER — Ambulatory Visit
Admission: RE | Admit: 2014-07-14 | Discharge: 2014-07-14 | Disposition: A | Discharge status: Home / Self Care | Payer: Medicare Other | Source: Ambulatory Visit | Attending: Neurosurgery | Admitting: Neurosurgery

## 2014-07-14 DIAGNOSIS — M5126 Other intervertebral disc displacement, lumbar region: Secondary | ICD-10-CM

## 2014-07-14 DIAGNOSIS — R222 Localized swelling, mass and lump, trunk: Secondary | ICD-10-CM

## 2014-07-14 DIAGNOSIS — M5415 Radiculopathy, thoracolumbar region: Secondary | ICD-10-CM

## 2014-07-14 MED ORDER — IOHEXOL 300 MG/ML  SOLN
75.0000 mL | Freq: Once | INTRAMUSCULAR | Status: AC | PRN
  Administered 2014-07-14: 75 mL via INTRAVENOUS

## 2014-07-15 ENCOUNTER — Other Ambulatory Visit (HOSPITAL_COMMUNITY): Payer: Self-pay | Admitting: Neurosurgery

## 2014-07-17 ENCOUNTER — Other Ambulatory Visit: Payer: Self-pay | Admitting: Physician Assistant

## 2014-08-05 ENCOUNTER — Other Ambulatory Visit: Payer: Self-pay | Admitting: Emergency Medicine

## 2014-08-11 ENCOUNTER — Other Ambulatory Visit: Payer: Self-pay | Admitting: Physician Assistant

## 2014-08-11 DIAGNOSIS — G4733 Obstructive sleep apnea (adult) (pediatric): Secondary | ICD-10-CM

## 2014-08-11 DIAGNOSIS — Z9989 Dependence on other enabling machines and devices: Secondary | ICD-10-CM

## 2014-08-11 DIAGNOSIS — F988 Other specified behavioral and emotional disorders with onset usually occurring in childhood and adolescence: Secondary | ICD-10-CM

## 2014-08-11 MED ORDER — AMPHETAMINE-DEXTROAMPHETAMINE 20 MG PO TABS: ORAL_TABLET | ORAL | Status: DC

## 2014-08-14 ENCOUNTER — Encounter: Payer: Self-pay | Admitting: Physician Assistant

## 2014-08-14 ENCOUNTER — Ambulatory Visit (INDEPENDENT_AMBULATORY_CARE_PROVIDER_SITE_OTHER): Payer: Medicare Other | Admitting: Physician Assistant

## 2014-08-14 VITALS — BP 144/86 | HR 92 | Temp 98.0°F | Resp 18 | Ht 62.0 in | Wt 203.0 lb

## 2014-08-14 DIAGNOSIS — G4733 Obstructive sleep apnea (adult) (pediatric): Secondary | ICD-10-CM

## 2014-08-14 DIAGNOSIS — F419 Anxiety disorder, unspecified: Secondary | ICD-10-CM

## 2014-08-14 DIAGNOSIS — F32A Depression, unspecified: Secondary | ICD-10-CM

## 2014-08-14 DIAGNOSIS — J209 Acute bronchitis, unspecified: Secondary | ICD-10-CM

## 2014-08-14 DIAGNOSIS — F329 Major depressive disorder, single episode, unspecified: Secondary | ICD-10-CM

## 2014-08-14 MED ORDER — AZITHROMYCIN 250 MG PO TABS: ORAL_TABLET | ORAL | Status: AC

## 2014-08-14 MED ORDER — BUPROPION HCL ER (XL) 300 MG PO TB24: 300.0000 mg | ORAL_TABLET | Freq: Every day | ORAL | Status: DC

## 2014-08-14 NOTE — Progress Notes (Signed)
Subjective:    Patient ID: Sara Cross, female    DOB: Mar 31, 1956, 58 y.o.   MRN: [id number]  HPI  A Caucasian 58yo female presents to the office today for sleep apnea.  Patient states she needs a new CPAP machine due to tripping over wire and CPAP machine being broken.  Patient states she is having surgery on August 29, 2014 for lumbar spine issue.  Patient states they are going to put a rod in her back.  Patient has had current CPAP for 5 years and states she is sleeping better and less fatigued.  She states she had restless sleep, periods of silence with loud snoring, fatigue, poor concentration symptoms before current CPAP use.  Patient states she is a former smoker and quit November 2015 and used to smoke 2-3 cigarettes per day since about age 30.  Patient has family history of sleep apnea in her 2 sisters.  Patient also has obesity and postmenopausal risk factors.  Patient states she needs CPAP for surgery and wants to use it in hospital.  Patient is also having anxiety and depression and currently on Wellbutrin XL 150mg  and would like to increase dose.  Patient states she was on 300mg  before and it worked.  Patient states she is having a hard time with mother's passing since the patient took care of her every day.  Patient states she is having cough with green mucus and chest tightness for the past couple of weeks.  GFR= >89 on 06/06/14 Review of Systems  Constitutional: Negative.   HENT: Negative.   Eyes: Negative.   Respiratory: Positive for cough and chest tightness. Negative for shortness of breath and wheezing.        Green mucus   Cardiovascular: Negative.   Gastrointestinal: Negative.   Genitourinary: Negative.   Musculoskeletal: Negative.   Skin: Negative.   Neurological: Negative.   Psychiatric/Behavioral: Negative.        Except anxiety and depression.   Past Medical History  Diagnosis Date  . Depression   . Pneumonia     hx of  . Diabetes mellitus     takes metformin  daily  . Arthritis   . Morbid obesity   . Anxiety   . Sleep apnea     wear cpap nightly  . GERD (gastroesophageal reflux disease)   . Headache(784.0)   . Dizziness   . DDD (degenerative disc disease)   . Hypertension   . Hyperlipidemia    Current Outpatient Prescriptions on File Prior to Visit  Medication Sig Dispense Refill  . ALPRAZolam (XANAX) 1 MG tablet TAKE ONE TABLET BY MOUTH TWICE DAILY 60 tablet 0  . amphetamine-dextroamphetamine (ADDERALL) 20 MG tablet Take 1/2 to 1 tablet 2 or 3 x day if needed for ADD 90 tablet 0  . B Complex-C (SUPER B COMPLEX PO) Take 1 tablet by mouth daily.    Marland Kitchen buPROPion (WELLBUTRIN XL) 150 MG 24 hr tablet Take 1 tablet (150 mg total) by mouth every morning. 30 tablet 2  . Cholecalciferol (VITAMIN D PO) Take 1 capsule by mouth daily.    . cyclobenzaprine (FLEXERIL) 10 MG tablet Take 1 tablet (10 mg total) by mouth at bedtime. 90 tablet 0  . Fluticasone Furoate-Vilanterol (BREO ELLIPTA) 100-25 MCG/INH AEPB 1 puff daily for breathing 60 each 5  . furosemide (LASIX) 20 MG tablet TAKE TWO TABLETS BY MOUTH ONCE DAILY 180 tablet 0  . lisinopril (PRINIVIL,ZESTRIL) 40 MG tablet TAKE ONE TABLET BY MOUTH ONCE DAILY  30 tablet 5  . MAGNESIUM PO Take 400 mg by mouth daily.     . metFORMIN (GLUCOPHAGE-XR) 500 MG 24 hr tablet TAKE TWO TABLETS BY MOUTH TWICE DAILY 360 tablet 5  . omeprazole (PRILOSEC) 20 MG capsule Take 1 capsule (20 mg total) by mouth daily. 90 capsule 1  . oxyCODONE-acetaminophen (PERCOCET/ROXICET) 5-325 MG per tablet Take 1 tablet by mouth every 6 (six) hours as needed for pain. 60 tablet 0  . VITAMIN A EX Apply topically daily.    . vitamin E (VITAMIN E) 400 UNIT capsule Take 800 Units by mouth daily.     No current facility-administered medications on file prior to visit.   Allergies  Allergen Reactions  . Benicar [Olmesartan]   . Lortab [Hydrocodone-Acetaminophen] Itching  . Ziac [Bisoprolol-Hydrochlorothiazide]      BP 144/86 mmHg   Pulse 92  Temp(Src) 98 F (36.7 C) (Temporal)  Resp 18  Ht 5\' 2"  (1.575 m)  Wt 203 lb (92.08 kg)  BMI 37.12 kg/m2  SpO2 98% Wt Readings from Last 3 Encounters:  08/14/14 203 lb (92.08 kg)  06/06/14 206 lb (93.441 kg)  04/02/14 220 lb (99.791 kg)   Objective:   Physical Exam  Constitutional: She is oriented to person, place, and time. She appears well-developed and well-nourished. She does not have a sickly appearance. No distress.  HENT:  Head: Normocephalic.  Right Ear: Tympanic membrane, external ear and ear canal normal.  Left Ear: Tympanic membrane, external ear and ear canal normal.  Nose: Nose normal. Right sinus exhibits no maxillary sinus tenderness and no frontal sinus tenderness. Left sinus exhibits no maxillary sinus tenderness and no frontal sinus tenderness.  Mouth/Throat: Uvula is midline and mucous membranes are normal. Mucous membranes are not pale and not dry. No trismus in the jaw. No uvula swelling. Posterior oropharyngeal erythema present. No oropharyngeal exudate, posterior oropharyngeal edema or tonsillar abscesses.  Eyes: Conjunctivae, EOM and lids are normal. Pupils are equal, round, and reactive to light. Right eye exhibits no discharge. Left eye exhibits no discharge. No scleral icterus.  Neck: Trachea normal, normal range of motion and phonation normal. Neck supple. No tracheal tenderness present. No tracheal deviation present. No thyroid mass and no thyromegaly present.  Cardiovascular: Normal rate, regular rhythm, S1 normal, S2 normal, normal heart sounds, intact distal pulses and normal pulses.  Exam reveals no gallop, no distant heart sounds and no friction rub.   No murmur heard. Pulmonary/Chest: Effort normal. No stridor. No respiratory distress. She has no decreased breath sounds. She has wheezes. She has no rhonchi. She has no rales. She exhibits no tenderness.  Abdominal: Soft. Bowel sounds are normal. There is no tenderness. There is no rebound and no  guarding.  Lymphadenopathy:  No tenderness and tonsils at +2 station bilaterally.  Neurological: She is alert and oriented to person, place, and time. No cranial nerve deficit. Gait normal.  Skin: Skin is warm, dry and intact. No rash noted. She is not diaphoretic. No pallor.  Psychiatric: She has a normal mood and affect. Her speech is normal and behavior is normal. Judgment and thought content normal. Cognition and memory are normal.  Vitals reviewed.  Assessment & Plan:  1. Acute bronchitis, unspecified organism -Take Z-Pak as prescribed- azithromycin (ZITHROMAX) 250 MG tablet; Take 2 tablets PO on day 1, then 1 tablet PO Q24H x 4 days  Dispense: 6 tablet; Refill: 0 Continue inhaler as prescribed.  2. OSA/CPAP Continue CPAP as prescribed.  Will send in paperwork  for new CPAP machine.  3. Depression and Anxiety Increase Wellbutrin XL 150mg  to 2 tablets per day.  Let us know if any side effects and if medication is working.  Sent in refill so that pharmacy knows you are taking 300mg  daily and can get refilled sooner.   Discussed medication effects and SE's.  Pt agreed to treatment plan. Please keep your follow up appt on 09/11/14.  Shenica Holzheimer, Stephani Police, PA-C 9:35 AM [facility]

## 2014-08-14 NOTE — Patient Instructions (Addendum)
-Take Z-Pak as prescribed. -Increase Wellbutrin XL- 2 tablets (150mg ) daily.  Can send in refill when needed.  Please let us know of any side effects. -Will send in paperwork for CPAP.   Please follow up on 09/11/13 with Dr. Melford Aase   Sleep Apnea  Sleep apnea is a sleep disorder characterized by abnormal pauses in breathing while you sleep. When your breathing pauses, the level of oxygen in your blood decreases. This causes you to move out of deep sleep and into light sleep. As a result, your quality of sleep is poor, and the system that carries your blood throughout your body (cardiovascular system) experiences stress. If sleep apnea remains untreated, the following conditions can develop:  High blood pressure (hypertension).  Coronary artery disease.  Inability to achieve or maintain an erection (impotence).  Impairment of your thought process (cognitive dysfunction). There are three types of sleep apnea: 1. Obstructive sleep apnea--Pauses in breathing during sleep because of a blocked airway. 2. Central sleep apnea--Pauses in breathing during sleep because the area of the brain that controls your breathing does not send the correct signals to the muscles that control breathing. 3. Mixed sleep apnea--A combination of both obstructive and central sleep apnea. RISK FACTORS The following risk factors can increase your risk of developing sleep apnea:  Being overweight.  Smoking.  Having narrow passages in your nose and throat.  Being of older age.  Being female.  Alcohol use.  Sedative and tranquilizer use.  Ethnicity. Among individuals younger than 35 years, African Americans are at increased risk of sleep apnea. SYMPTOMS   Difficulty staying asleep.  Daytime sleepiness and fatigue.  Loss of energy.  Irritability.  Loud, heavy snoring.  Morning headaches.  Trouble concentrating.  Forgetfulness.  Decreased interest in sex. DIAGNOSIS  In order to diagnose sleep  apnea, your caregiver will perform a physical examination. Your caregiver may suggest that you take a home sleep test. Your caregiver may also recommend that you spend the night in a sleep lab. In the sleep lab, several monitors record information about your heart, lungs, and brain while you sleep. Your leg and arm movements and blood oxygen level are also recorded. TREATMENT The following actions may help to resolve mild sleep apnea:  Sleeping on your side.   Using a decongestant if you have nasal congestion.   Avoiding the use of depressants, including alcohol, sedatives, and narcotics.   Losing weight and modifying your diet if you are overweight. There also are devices and treatments to help open your airway:  Oral appliances. These are custom-made mouthpieces that shift your lower jaw forward and slightly open your bite. This opens your airway.  Devices that create positive airway pressure. This positive pressure "splints" your airway open to help you breathe better during sleep. The following devices create positive airway pressure:  Continuous positive airway pressure (CPAP) device. The CPAP device creates a continuous level of air pressure with an air pump. The air is delivered to your airway through a mask while you sleep. This continuous pressure keeps your airway open.  Nasal expiratory positive airway pressure (EPAP) device. The EPAP device creates positive air pressure as you exhale. The device consists of single-use valves, which are inserted into each nostril and held in place by adhesive. The valves create very little resistance when you inhale but create much more resistance when you exhale. That increased resistance creates the positive airway pressure. This positive pressure while you exhale keeps your airway open, making it easier  to breath when you inhale again.  Bilevel positive airway pressure (BPAP) device. The BPAP device is used mainly in patients with central sleep  apnea. This device is similar to the CPAP device because it also uses an air pump to deliver continuous air pressure through a mask. However, with the BPAP machine, the pressure is set at two different levels. The pressure when you exhale is lower than the pressure when you inhale.  Surgery. Typically, surgery is only done if you cannot comply with less invasive treatments or if the less invasive treatments do not improve your condition. Surgery involves removing excess tissue in your airway to create a wider passage way. Document Released: 07/22/2002 Document Revised: 11/26/2012 Document Reviewed: 12/08/2011 [facility] Patient Information 2015 [place], [place]. This information is not intended to replace advice given to you by your health care provider. Make sure you discuss any questions you have with your health care provider.

## 2014-08-15 ENCOUNTER — Encounter: Payer: Self-pay | Admitting: *Deleted

## 2014-08-21 ENCOUNTER — Encounter (HOSPITAL_COMMUNITY): Payer: Self-pay

## 2014-08-21 ENCOUNTER — Encounter (HOSPITAL_COMMUNITY)
Admission: RE | Admit: 2014-08-21 | Discharge: 2014-08-21 | Disposition: A | Discharge status: Home / Self Care | Payer: Medicare Other | Source: Ambulatory Visit | Attending: Neurosurgery | Admitting: Neurosurgery

## 2014-08-21 DIAGNOSIS — Z01812 Encounter for preprocedural laboratory examination: Secondary | ICD-10-CM | POA: Insufficient documentation

## 2014-08-21 LAB — CBC
HEMATOCRIT: 39.5 % (ref 36.0–46.0)
Hemoglobin: 13.6 g/dL (ref 12.0–15.0)
MCH: 31.5 pg (ref 26.0–34.0)
MCHC: 34.4 g/dL (ref 30.0–36.0)
MCV: 91.4 fL (ref 78.0–100.0)
Platelets: 240 10*3/uL (ref 150–400)
RBC: 4.32 MIL/uL (ref 3.87–5.11)
RDW: 13.5 % (ref 11.5–15.5)
WBC: 7.1 10*3/uL (ref 4.0–10.5)

## 2014-08-21 LAB — TYPE AND SCREEN
ABO/RH(D): O NEG
Antibody Screen: NEGATIVE

## 2014-08-21 LAB — SURGICAL PCR SCREEN
MRSA, PCR: NEGATIVE
Staphylococcus aureus: NEGATIVE

## 2014-08-21 LAB — BASIC METABOLIC PANEL
Anion gap: 8 (ref 5–15)
BUN: 12 mg/dL (ref 6–23)
CHLORIDE: 101 meq/L (ref 96–112)
CO2: 28 mmol/L (ref 19–32)
Calcium: 9.9 mg/dL (ref 8.4–10.5)
Creatinine, Ser: 0.61 mg/dL (ref 0.50–1.10)
Glucose, Bld: 148 mg/dL — ABNORMAL HIGH (ref 70–99)
Potassium: 4.1 mmol/L (ref 3.5–5.1)
SODIUM: 137 mmol/L (ref 135–145)

## 2014-08-21 NOTE — Pre-Procedure Instructions (Signed)
Sara Cross  08/21/2014   Your procedure is scheduled on:  Friday, January 15th  Report to [facility] Admitting at 530 AM.  Call this number if you have problems the morning of surgery: [phone]   Remember:   Do not eat food or drink liquids after midnight.   Take these medicines the morning of surgery with A SIP OF WATER: xanax, wellbutrin, prilosec, percocoet if needed, breo inhaler   Do not wear jewelry, make-up or nail polish.  Do not wear lotions, powders, or perfumes, deodorant.  Do not shave 48 hours prior to surgery. Men may shave face and neck.  Do not bring valuables to the hospital.  [facility] is not responsible for any belongings or valuables.               Contacts, dentures or bridgework may not be worn into surgery.  Leave suitcase in the car. After surgery it may be brought to your room.  For patients admitted to the hospital, discharge time is determined by your  treatment team.           Please read over the following fact sheets that you were given: Pain Booklet, Coughing and Deep Breathing, Blood Transfusion Information, MRSA Information and Surgical Site Infection Prevention  [place] - Preparing for Surgery  Before surgery, you can play an important role.  Because skin is not sterile, your skin needs to be as free of germs as possible.  You can reduce the number of germs on you skin by washing with CHG (chlorahexidine gluconate) soap before surgery.  CHG is an antiseptic cleaner which kills germs and bonds with the skin to continue killing germs even after washing.  Please DO NOT use if you have an allergy to CHG or antibacterial soaps.  If your skin becomes reddened/irritated stop using the CHG and inform your nurse when you arrive at Short Stay.  Do not shave (including legs and underarms) for at least 48 hours prior to the first CHG shower.  You may shave your face.  Please follow these instructions carefully:   1.  Shower with CHG Soap  the night before surgery and the morning of Surgery.  2.  If you choose to wash your hair, wash your hair first as usual with your normal shampoo.  3.  After you shampoo, rinse your hair and body thoroughly to remove the shampoo.  4.  Use CHG as you would any other liquid soap.  You can apply CHG directly to the skin and wash gently with scrungie or a clean washcloth.  5.  Apply the CHG Soap to your body ONLY FROM THE NECK DOWN.  Do not use on open wounds or open sores.  Avoid contact with your eyes, ears, mouth and genitals (private parts).  Wash genitals (private parts) with your normal soap.  6.  Wash thoroughly, paying special attention to the area where your surgery will be performed.  7.  Thoroughly rinse your body with warm water from the neck down.  8.  DO NOT shower/wash with your normal soap after using and rinsing off the CHG Soap.  9.  Pat yourself dry with a clean towel.            10.  Wear clean pajamas.            11.  Place clean sheets on your bed the night of your first shower and do not sleep with pets.  Day of  Surgery  Do not apply any lotions/deoderants the morning of surgery.  Please wear clean clothes to the hospital/surgery center.

## 2014-08-21 NOTE — Progress Notes (Signed)
Primary - mckowen Did see dr. Radford Pax 2015 for cp - had stress, ekg - normal no follow up needed

## 2014-08-28 MED ORDER — CEFAZOLIN SODIUM-DEXTROSE 2-3 GM-% IV SOLR
2.0000 g | INTRAVENOUS | Status: AC
  Administered 2014-08-29 (×2): 2 g via INTRAVENOUS
  Filled 2014-08-28: qty 50

## 2014-08-29 ENCOUNTER — Inpatient Hospital Stay (HOSPITAL_COMMUNITY): Payer: Medicare Other

## 2014-08-29 ENCOUNTER — Inpatient Hospital Stay (HOSPITAL_COMMUNITY): Payer: Medicare Other | Admitting: Anesthesiology

## 2014-08-29 ENCOUNTER — Encounter (HOSPITAL_COMMUNITY)
Admission: RE | Disposition: A | Discharge status: Home / Self Care | Payer: Medicare Other | Source: Ambulatory Visit | Attending: Neurosurgery

## 2014-08-29 ENCOUNTER — Inpatient Hospital Stay (HOSPITAL_COMMUNITY)
Admission: RE | Admit: 2014-08-29 | Discharge: 2014-09-02 | DRG: 460 | Disposition: A | Discharge status: Home / Self Care | Payer: Medicare Other | Source: Ambulatory Visit | Attending: Neurosurgery | Admitting: Neurosurgery

## 2014-08-29 ENCOUNTER — Encounter (HOSPITAL_COMMUNITY): Payer: Self-pay | Admitting: *Deleted

## 2014-08-29 DIAGNOSIS — M4724 Other spondylosis with radiculopathy, thoracic region: Secondary | ICD-10-CM | POA: Diagnosis present

## 2014-08-29 DIAGNOSIS — M5124 Other intervertebral disc displacement, thoracic region: Secondary | ICD-10-CM | POA: Diagnosis present

## 2014-08-29 DIAGNOSIS — M4806 Spinal stenosis, lumbar region: Secondary | ICD-10-CM | POA: Diagnosis present

## 2014-08-29 DIAGNOSIS — Z419 Encounter for procedure for purposes other than remedying health state, unspecified: Secondary | ICD-10-CM

## 2014-08-29 DIAGNOSIS — M549 Dorsalgia, unspecified: Secondary | ICD-10-CM | POA: Diagnosis present

## 2014-08-29 DIAGNOSIS — M48061 Spinal stenosis, lumbar region without neurogenic claudication: Secondary | ICD-10-CM | POA: Diagnosis present

## 2014-08-29 DIAGNOSIS — M4180 Other forms of scoliosis, site unspecified: Secondary | ICD-10-CM | POA: Diagnosis present

## 2014-08-29 HISTORY — PX: LUMBAR LAMINECTOMY/DECOMPRESSION MICRODISCECTOMY: SHX5026

## 2014-08-29 LAB — GLUCOSE, CAPILLARY
Glucose-Capillary: 129 mg/dL — ABNORMAL HIGH (ref 70–99)
Glucose-Capillary: 129 mg/dL — ABNORMAL HIGH (ref 70–99)

## 2014-08-29 LAB — POCT I-STAT 4, (NA,K, GLUC, HGB,HCT)
GLUCOSE: 112 mg/dL — AB (ref 70–99)
HEMATOCRIT: 29 % — AB (ref 36.0–46.0)
HEMOGLOBIN: 9.9 g/dL — AB (ref 12.0–15.0)
Potassium: 4.6 mmol/L (ref 3.5–5.1)
SODIUM: 138 mmol/L (ref 135–145)

## 2014-08-29 SURGERY — POSTERIOR LUMBAR FUSION 3 LEVEL
Anesthesia: General | Site: Spine Thoracic

## 2014-08-29 MED ORDER — NEOSTIGMINE METHYLSULFATE 10 MG/10ML IV SOLN
INTRAVENOUS | Status: DC | PRN
  Administered 2014-08-29: 4 mg via INTRAVENOUS

## 2014-08-29 MED ORDER — SUFENTANIL CITRATE 50 MCG/ML IV SOLN
INTRAVENOUS | Status: AC
  Filled 2014-08-29: qty 1

## 2014-08-29 MED ORDER — 0.9 % SODIUM CHLORIDE (POUR BTL) OPTIME
TOPICAL | Status: DC | PRN
  Administered 2014-08-29 (×2): 1000 mL

## 2014-08-29 MED ORDER — SUCCINYLCHOLINE CHLORIDE 20 MG/ML IJ SOLN
INTRAMUSCULAR | Status: AC
  Filled 2014-08-29: qty 1

## 2014-08-29 MED ORDER — ROCURONIUM BROMIDE 50 MG/5ML IV SOLN
INTRAVENOUS | Status: AC
  Filled 2014-08-29: qty 2

## 2014-08-29 MED ORDER — SUFENTANIL CITRATE 50 MCG/ML IV SOLN
INTRAVENOUS | Status: DC | PRN
  Administered 2014-08-29 (×9): 10 ug via INTRAVENOUS

## 2014-08-29 MED ORDER — MIDAZOLAM HCL 2 MG/2ML IJ SOLN
INTRAMUSCULAR | Status: AC
  Filled 2014-08-29: qty 2

## 2014-08-29 MED ORDER — BUPIVACAINE HCL (PF) 0.25 % IJ SOLN
INTRAMUSCULAR | Status: DC | PRN
  Administered 2014-08-29: 10 mL

## 2014-08-29 MED ORDER — PANTOPRAZOLE SODIUM 40 MG PO TBEC
40.0000 mg | DELAYED_RELEASE_TABLET | Freq: Every day | ORAL | Status: DC
  Administered 2014-08-29 – 2014-09-02 (×5): 40 mg via ORAL
  Filled 2014-08-29 (×5): qty 1

## 2014-08-29 MED ORDER — ALUM & MAG HYDROXIDE-SIMETH 200-200-20 MG/5ML PO SUSP: 30.0000 mL | Freq: Four times a day (QID) | ORAL | Status: DC | PRN

## 2014-08-29 MED ORDER — PROPOFOL 10 MG/ML IV BOLUS
INTRAVENOUS | Status: DC | PRN
  Administered 2014-08-29: 200 mg via INTRAVENOUS

## 2014-08-29 MED ORDER — PHENYLEPHRINE 40 MCG/ML (10ML) SYRINGE FOR IV PUSH (FOR BLOOD PRESSURE SUPPORT)
PREFILLED_SYRINGE | INTRAVENOUS | Status: AC
  Filled 2014-08-29: qty 10

## 2014-08-29 MED ORDER — ONDANSETRON HCL 4 MG/2ML IJ SOLN
INTRAMUSCULAR | Status: AC
  Filled 2014-08-29: qty 2

## 2014-08-29 MED ORDER — ONDANSETRON HCL 4 MG/2ML IJ SOLN: 4.0000 mg | Freq: Once | INTRAMUSCULAR | Status: DC | PRN

## 2014-08-29 MED ORDER — LISINOPRIL 20 MG PO TABS
20.0000 mg | ORAL_TABLET | Freq: Every day | ORAL | Status: DC
  Administered 2014-08-31 – 2014-09-02 (×2): 20 mg via ORAL
  Filled 2014-08-29 (×4): qty 1

## 2014-08-29 MED ORDER — DOCUSATE SODIUM 100 MG PO CAPS
100.0000 mg | ORAL_CAPSULE | Freq: Two times a day (BID) | ORAL | Status: DC
  Administered 2014-08-29 – 2014-09-02 (×8): 100 mg via ORAL
  Filled 2014-08-29 (×8): qty 1

## 2014-08-29 MED ORDER — DIPHENHYDRAMINE HCL 50 MG/ML IJ SOLN
INTRAMUSCULAR | Status: DC | PRN
  Administered 2014-08-29: 12.5 mg via INTRAVENOUS

## 2014-08-29 MED ORDER — PHENYLEPHRINE HCL 10 MG/ML IJ SOLN
INTRAMUSCULAR | Status: DC | PRN
  Administered 2014-08-29 (×2): 40 ug via INTRAVENOUS

## 2014-08-29 MED ORDER — BACITRACIN 50000 UNITS IM SOLR
INTRAMUSCULAR | Status: DC | PRN
  Administered 2014-08-29: 500 mL

## 2014-08-29 MED ORDER — ALPRAZOLAM 0.5 MG PO TABS
1.0000 mg | ORAL_TABLET | Freq: Two times a day (BID) | ORAL | Status: DC
Start: 2014-08-29 — End: 2014-09-02
  Administered 2014-08-29 – 2014-09-02 (×8): 1 mg via ORAL
  Filled 2014-08-29 (×8): qty 2

## 2014-08-29 MED ORDER — PHENYLEPHRINE HCL 10 MG/ML IJ SOLN
INTRAMUSCULAR | Status: AC
  Filled 2014-08-29: qty 1

## 2014-08-29 MED ORDER — FUROSEMIDE 40 MG PO TABS
40.0000 mg | ORAL_TABLET | Freq: Every day | ORAL | Status: DC
  Administered 2014-08-29 – 2014-09-02 (×5): 40 mg via ORAL
  Filled 2014-08-29 (×5): qty 1

## 2014-08-29 MED ORDER — PHENOL 1.4 % MT LIQD: 1.0000 | OROMUCOSAL | Status: DC | PRN

## 2014-08-29 MED ORDER — DIPHENHYDRAMINE HCL 50 MG/ML IJ SOLN
INTRAMUSCULAR | Status: AC
  Filled 2014-08-29: qty 1

## 2014-08-29 MED ORDER — MENTHOL 3 MG MT LOZG: 1.0000 | LOZENGE | OROMUCOSAL | Status: DC | PRN

## 2014-08-29 MED ORDER — HYDROMORPHONE HCL 1 MG/ML IJ SOLN
0.2500 mg | INTRAMUSCULAR | Status: DC | PRN
  Administered 2014-08-29 (×2): 0.25 mg via INTRAVENOUS
  Administered 2014-08-29 (×2): 0.5 mg via INTRAVENOUS

## 2014-08-29 MED ORDER — ACETAMINOPHEN 325 MG PO TABS: 650.0000 mg | ORAL_TABLET | ORAL | Status: DC | PRN

## 2014-08-29 MED ORDER — PROPOFOL 10 MG/ML IV BOLUS
INTRAVENOUS | Status: AC
  Filled 2014-08-29: qty 20

## 2014-08-29 MED ORDER — ROCURONIUM BROMIDE 50 MG/5ML IV SOLN
INTRAVENOUS | Status: AC
  Filled 2014-08-29: qty 1

## 2014-08-29 MED ORDER — DEXAMETHASONE SODIUM PHOSPHATE 4 MG/ML IJ SOLN
INTRAMUSCULAR | Status: AC
  Filled 2014-08-29: qty 2

## 2014-08-29 MED ORDER — HYDROMORPHONE HCL 1 MG/ML IJ SOLN
INTRAMUSCULAR | Status: AC
  Administered 2014-08-29: 0.5 mg via INTRAVENOUS
  Filled 2014-08-29: qty 2

## 2014-08-29 MED ORDER — MAGNESIUM OXIDE 400 (241.3 MG) MG PO TABS
400.0000 mg | ORAL_TABLET | Freq: Every day | ORAL | Status: DC
  Administered 2014-08-30 – 2014-09-02 (×4): 400 mg via ORAL
  Filled 2014-08-29 (×4): qty 1

## 2014-08-29 MED ORDER — HYDROMORPHONE HCL 1 MG/ML IJ SOLN
0.5000 mg | INTRAMUSCULAR | Status: DC | PRN
  Administered 2014-08-29 – 2014-08-30 (×4): 1 mg via INTRAVENOUS
  Filled 2014-08-29 (×4): qty 1

## 2014-08-29 MED ORDER — OXYCODONE-ACETAMINOPHEN 5-325 MG PO TABS
1.0000 | ORAL_TABLET | ORAL | Status: DC | PRN
  Administered 2014-08-31 – 2014-09-02 (×10): 2 via ORAL
  Filled 2014-08-29 (×9): qty 2

## 2014-08-29 MED ORDER — CEFAZOLIN SODIUM-DEXTROSE 2-3 GM-% IV SOLR
2.0000 g | Freq: Three times a day (TID) | INTRAVENOUS | Status: AC
  Administered 2014-08-29 – 2014-09-02 (×12): 2 g via INTRAVENOUS
  Filled 2014-08-29 (×13): qty 50

## 2014-08-29 MED ORDER — MIDAZOLAM HCL 5 MG/5ML IJ SOLN
INTRAMUSCULAR | Status: DC | PRN
  Administered 2014-08-29 (×2): 1 mg via INTRAVENOUS

## 2014-08-29 MED ORDER — DEXAMETHASONE SODIUM PHOSPHATE 4 MG/ML IJ SOLN
INTRAMUSCULAR | Status: DC | PRN
  Administered 2014-08-29: 8 mg via INTRAVENOUS

## 2014-08-29 MED ORDER — NEOSTIGMINE METHYLSULFATE 10 MG/10ML IV SOLN
INTRAVENOUS | Status: AC
  Filled 2014-08-29: qty 1

## 2014-08-29 MED ORDER — ARTIFICIAL TEARS OP OINT
TOPICAL_OINTMENT | OPHTHALMIC | Status: AC
  Filled 2014-08-29: qty 3.5

## 2014-08-29 MED ORDER — CYCLOBENZAPRINE HCL 10 MG PO TABS
10.0000 mg | ORAL_TABLET | Freq: Three times a day (TID) | ORAL | Status: DC | PRN
  Administered 2014-08-29 – 2014-09-02 (×5): 10 mg via ORAL
  Filled 2014-08-29 (×4): qty 1

## 2014-08-29 MED ORDER — LIDOCAINE-EPINEPHRINE 1 %-1:100000 IJ SOLN
INTRAMUSCULAR | Status: DC | PRN
  Administered 2014-08-29: 10 mL
  Administered 2014-08-29: 8 mL

## 2014-08-29 MED ORDER — ROCURONIUM BROMIDE 100 MG/10ML IV SOLN
INTRAVENOUS | Status: DC | PRN
  Administered 2014-08-29: 20 mg via INTRAVENOUS
  Administered 2014-08-29 (×3): 10 mg via INTRAVENOUS
  Administered 2014-08-29: 20 mg via INTRAVENOUS
  Administered 2014-08-29: 50 mg via INTRAVENOUS
  Administered 2014-08-29: 10 mg via INTRAVENOUS
  Administered 2014-08-29: 20 mg via INTRAVENOUS

## 2014-08-29 MED ORDER — LIDOCAINE HCL (CARDIAC) 20 MG/ML IV SOLN
INTRAVENOUS | Status: DC | PRN
  Administered 2014-08-29: 50 mg via INTRAVENOUS

## 2014-08-29 MED ORDER — THROMBIN 20000 UNITS EX SOLR
CUTANEOUS | Status: DC | PRN
  Administered 2014-08-29 (×3): 20 mL via TOPICAL

## 2014-08-29 MED ORDER — SODIUM CHLORIDE 0.9 % IV SOLN: 250.0000 mL | INTRAVENOUS | Status: DC

## 2014-08-29 MED ORDER — LIDOCAINE HCL (CARDIAC) 20 MG/ML IV SOLN
INTRAVENOUS | Status: AC
  Filled 2014-08-29: qty 10

## 2014-08-29 MED ORDER — GLYCOPYRROLATE 0.2 MG/ML IJ SOLN
INTRAMUSCULAR | Status: DC | PRN
  Administered 2014-08-29: .7 mg via INTRAVENOUS

## 2014-08-29 MED ORDER — GLYCOPYRROLATE 0.2 MG/ML IJ SOLN
INTRAMUSCULAR | Status: AC
  Filled 2014-08-29: qty 3

## 2014-08-29 MED ORDER — SENNA 8.6 MG PO TABS
1.0000 | ORAL_TABLET | Freq: Two times a day (BID) | ORAL | Status: DC
  Administered 2014-08-29 – 2014-09-02 (×8): 8.6 mg via ORAL
  Filled 2014-08-29 (×8): qty 1

## 2014-08-29 MED ORDER — IBUPROFEN 200 MG PO TABS: 800.0000 mg | ORAL_TABLET | ORAL | Status: DC | PRN

## 2014-08-29 MED ORDER — LACTATED RINGERS IV SOLN
INTRAVENOUS | Status: DC | PRN
  Administered 2014-08-29 (×4): via INTRAVENOUS

## 2014-08-29 MED ORDER — MAGNESIUM 200 MG PO TABS: 400.0000 mg | ORAL_TABLET | Freq: Every day | ORAL | Status: DC

## 2014-08-29 MED ORDER — EPHEDRINE SULFATE 50 MG/ML IJ SOLN
INTRAMUSCULAR | Status: AC
  Filled 2014-08-29: qty 1

## 2014-08-29 MED ORDER — ONDANSETRON HCL 4 MG/2ML IJ SOLN: 4.0000 mg | INTRAMUSCULAR | Status: DC | PRN

## 2014-08-29 MED ORDER — CYCLOBENZAPRINE HCL 10 MG PO TABS
ORAL_TABLET | ORAL | Status: AC
  Filled 2014-08-29: qty 1

## 2014-08-29 MED ORDER — VITAMIN E 180 MG (400 UNIT) PO CAPS
800.0000 [IU] | ORAL_CAPSULE | Freq: Every day | ORAL | Status: DC
  Administered 2014-08-30 – 2014-09-02 (×4): 800 [IU] via ORAL
  Filled 2014-08-29 (×4): qty 2

## 2014-08-29 MED ORDER — ONDANSETRON HCL 4 MG/2ML IJ SOLN
INTRAMUSCULAR | Status: DC | PRN
  Administered 2014-08-29: 4 mg via INTRAVENOUS

## 2014-08-29 MED ORDER — METFORMIN HCL ER 500 MG PO TB24
1000.0000 mg | ORAL_TABLET | Freq: Two times a day (BID) | ORAL | Status: DC
  Administered 2014-08-29 – 2014-09-02 (×9): 1000 mg via ORAL
  Filled 2014-08-29 (×9): qty 2

## 2014-08-29 MED ORDER — SODIUM CHLORIDE 0.9 % IJ SOLN
INTRAMUSCULAR | Status: AC
Start: 2014-08-29 — End: 2014-08-29
  Filled 2014-08-29: qty 20

## 2014-08-29 MED ORDER — CEFAZOLIN SODIUM-DEXTROSE 2-3 GM-% IV SOLR
INTRAVENOUS | Status: AC
  Filled 2014-08-29: qty 50

## 2014-08-29 MED ORDER — OXYCODONE-ACETAMINOPHEN 5-325 MG PO TABS
1.0000 | ORAL_TABLET | ORAL | Status: DC | PRN
  Administered 2014-09-01 (×2): 1 via ORAL
  Filled 2014-08-29 (×3): qty 1

## 2014-08-29 MED ORDER — ALBUMIN HUMAN 5 % IV SOLN
INTRAVENOUS | Status: DC | PRN
  Administered 2014-08-29 (×3): via INTRAVENOUS

## 2014-08-29 MED ORDER — DEXTROSE 5 % IV SOLN
10.0000 mg | INTRAVENOUS | Status: DC | PRN
  Administered 2014-08-29: 5 ug/min via INTRAVENOUS

## 2014-08-29 MED ORDER — SODIUM CHLORIDE 0.9 % IJ SOLN
3.0000 mL | Freq: Two times a day (BID) | INTRAMUSCULAR | Status: DC
  Administered 2014-08-29 – 2014-08-30 (×2): 3 mL via INTRAVENOUS

## 2014-08-29 MED ORDER — SODIUM CHLORIDE 0.9 % IJ SOLN: 3.0000 mL | INTRAMUSCULAR | Status: DC | PRN

## 2014-08-29 MED ORDER — BUPROPION HCL ER (XL) 150 MG PO TB24
300.0000 mg | ORAL_TABLET | Freq: Every day | ORAL | Status: DC
  Administered 2014-08-30 – 2014-09-02 (×4): 300 mg via ORAL
  Filled 2014-08-29 (×4): qty 2

## 2014-08-29 MED ORDER — ACETAMINOPHEN 650 MG RE SUPP: 650.0000 mg | RECTAL | Status: DC | PRN

## 2014-08-29 MED ORDER — CYCLOBENZAPRINE HCL 10 MG PO TABS
10.0000 mg | ORAL_TABLET | Freq: Every day | ORAL | Status: DC
  Administered 2014-08-29 – 2014-09-01 (×4): 10 mg via ORAL
  Filled 2014-08-29 (×4): qty 1

## 2014-08-29 SURGICAL SUPPLY — 91 items
ALLOSTEM STRIP 20MMX50MM (Tissue) ×6 IMPLANT
APL SKNCLS STERI-STRIP NONHPOA (GAUZE/BANDAGES/DRESSINGS) ×2
BAG DECANTER FOR FLEXI CONT (MISCELLANEOUS) ×8 IMPLANT
BENZOIN TINCTURE PRP APPL 2/3 (GAUZE/BANDAGES/DRESSINGS) ×6 IMPLANT
BLADE CLIPPER SURG (BLADE) IMPLANT
BLADE SURG 11 STRL SS (BLADE) ×6 IMPLANT
BONE ALLOSTEM MORSELIZED 5CC (Bone Implant) ×4 IMPLANT
BRUSH SCRUB EZ PLAIN DRY (MISCELLANEOUS) ×6 IMPLANT
BUR MATCHSTICK NEURO 3.0 LAGG (BURR) ×8 IMPLANT
BUR PRECISION FLUTE 6.0 (BURR) ×8 IMPLANT
CAGE ALTERA 8X12-8 (Cage) ×2 IMPLANT
CANISTER SUCT 3000ML (MISCELLANEOUS) ×6 IMPLANT
CAP REVERE LOCKING (Cap) ×20 IMPLANT
CLOSURE WOUND 1/2 X4 (GAUZE/BANDAGES/DRESSINGS) ×3
CONT SPEC 4OZ CLIKSEAL STRL BL (MISCELLANEOUS) ×10 IMPLANT
COVER BACK TABLE 60X90IN (DRAPES) ×4 IMPLANT
DECANTER SPIKE VIAL GLASS SM (MISCELLANEOUS) ×4 IMPLANT
DRAPE C-ARM 42X72 X-RAY (DRAPES) ×12 IMPLANT
DRAPE LAPAROTOMY 100X72X124 (DRAPES) ×6 IMPLANT
DRAPE MICROSCOPE LEICA (MISCELLANEOUS) ×4 IMPLANT
DRAPE POUCH INSTRU U-SHP 10X18 (DRAPES) ×6 IMPLANT
DRAPE PROXIMA HALF (DRAPES) ×2 IMPLANT
DRAPE SURG 17X23 STRL (DRAPES) ×6 IMPLANT
DRSG OPSITE 4X5.5 SM (GAUZE/BANDAGES/DRESSINGS) ×8 IMPLANT
DRSG OPSITE POSTOP 3X4 (GAUZE/BANDAGES/DRESSINGS) ×2 IMPLANT
DRSG OPSITE POSTOP 4X10 (GAUZE/BANDAGES/DRESSINGS) ×2 IMPLANT
DRSG OPSITE POSTOP 4X6 (GAUZE/BANDAGES/DRESSINGS) ×2 IMPLANT
DRSG OPSITE POSTOP 4X8 (GAUZE/BANDAGES/DRESSINGS) ×2 IMPLANT
DURAPREP 26ML APPLICATOR (WOUND CARE) ×6 IMPLANT
ELECT BLADE 4.0 EZ CLEAN MEGAD (MISCELLANEOUS) ×4
ELECT REM PT RETURN 9FT ADLT (ELECTROSURGICAL) ×4
ELECTRODE BLDE 4.0 EZ CLN MEGD (MISCELLANEOUS) IMPLANT
ELECTRODE REM PT RTRN 9FT ADLT (ELECTROSURGICAL) ×4 IMPLANT
EVACUATOR 3/16  PVC DRAIN (DRAIN) ×2
EVACUATOR 3/16 PVC DRAIN (DRAIN) ×2 IMPLANT
GAUZE SPONGE 4X4 12PLY STRL (GAUZE/BANDAGES/DRESSINGS) ×6 IMPLANT
GAUZE SPONGE 4X4 16PLY XRAY LF (GAUZE/BANDAGES/DRESSINGS) ×6 IMPLANT
GLOVE BIO SURGEON STRL SZ7 (GLOVE) ×4 IMPLANT
GLOVE BIO SURGEON STRL SZ8 (GLOVE) ×14 IMPLANT
GLOVE BIOGEL M 8.0 STRL (GLOVE) ×4 IMPLANT
GLOVE BIOGEL PI IND STRL 7.5 (GLOVE) IMPLANT
GLOVE BIOGEL PI INDICATOR 7.5 (GLOVE) ×8
GLOVE EXAM NITRILE LRG STRL (GLOVE) IMPLANT
GLOVE EXAM NITRILE MD LF STRL (GLOVE) IMPLANT
GLOVE EXAM NITRILE XL STR (GLOVE) IMPLANT
GLOVE EXAM NITRILE XS STR PU (GLOVE) IMPLANT
GLOVE INDICATOR 8.5 STRL (GLOVE) ×10 IMPLANT
GLOVE SURG SS PI 7.0 STRL IVOR (GLOVE) ×10 IMPLANT
GOWN STRL REUS W/ TWL LRG LVL3 (GOWN DISPOSABLE) ×2 IMPLANT
GOWN STRL REUS W/ TWL XL LVL3 (GOWN DISPOSABLE) ×8 IMPLANT
GOWN STRL REUS W/TWL 2XL LVL3 (GOWN DISPOSABLE) IMPLANT
GOWN STRL REUS W/TWL LRG LVL3 (GOWN DISPOSABLE) ×4
GOWN STRL REUS W/TWL XL LVL3 (GOWN DISPOSABLE) ×24
IMPL SPINE RISE 8-15-10-26M (Neuro Prosthesis/Implant) IMPLANT
IMPLANT SPINE RISE 8-15-10-26M (Neuro Prosthesis/Implant) ×8 IMPLANT
KIT BASIN OR (CUSTOM PROCEDURE TRAY) ×6 IMPLANT
KIT ROOM TURNOVER OR (KITS) ×6 IMPLANT
LIQUID BAND (GAUZE/BANDAGES/DRESSINGS) ×8 IMPLANT
NDL HYPO 25X1 1.5 SAFETY (NEEDLE) ×2 IMPLANT
NDL SPNL 22GX3.5 QUINCKE BK (NEEDLE) ×2 IMPLANT
NEEDLE HYPO 22GX1.5 SAFETY (NEEDLE) ×2 IMPLANT
NEEDLE HYPO 25X1 1.5 SAFETY (NEEDLE) ×4 IMPLANT
NEEDLE SPNL 22GX3.5 QUINCKE BK (NEEDLE) IMPLANT
NS IRRIG 1000ML POUR BTL (IV SOLUTION) ×6 IMPLANT
PACK LAMINECTOMY NEURO (CUSTOM PROCEDURE TRAY) ×6 IMPLANT
PAD ARMBOARD 7.5X6 YLW CONV (MISCELLANEOUS) ×18 IMPLANT
PATTIES SURGICAL 1X1 (DISPOSABLE) ×2 IMPLANT
ROD REVERE 6.35 CURVED 60MM (Rod) ×2 IMPLANT
ROD REVERE 6.35 CURVED 70MM (Rod) ×4 IMPLANT
ROD STRAIGHT REVERE 6.35X55MM (Rod) ×2 IMPLANT
RUBBERBAND STERILE (MISCELLANEOUS) ×8 IMPLANT
SCREW REVERE 6.35 6.5MMX45 (Screw) ×2 IMPLANT
SCREW REVERE 6.35 6.5X30 (Screw) ×2 IMPLANT
SCREW REVERE 6.35 6.5X35MM (Screw) ×2 IMPLANT
SCREW REVERE 6.35 6.5X40MM (Screw) ×6 IMPLANT
SPACER RISE 10X22 8-14MM-10 (Spacer) ×4 IMPLANT
SPONGE LAP 4X18 X RAY DECT (DISPOSABLE) ×4 IMPLANT
SPONGE SURGIFOAM ABS GEL 100 (HEMOSTASIS) ×8 IMPLANT
SPONGE SURGIFOAM ABS GEL SZ50 (HEMOSTASIS) ×2 IMPLANT
STAPLER VISISTAT 35W (STAPLE) ×2 IMPLANT
STRIP CLOSURE SKIN 1/2X4 (GAUZE/BANDAGES/DRESSINGS) ×9 IMPLANT
SUCTION TUBING 6MM{1/4 IN. X 3.0M (10FT.) L ×2 IMPLANT
SUT VIC AB 0 CT1 18XCR BRD8 (SUTURE) ×6 IMPLANT
SUT VIC AB 0 CT1 8-18 (SUTURE) ×12
SUT VIC AB 2-0 CT1 18 (SUTURE) ×12 IMPLANT
SUT VICRYL 4-0 PS2 18IN ABS (SUTURE) ×8 IMPLANT
SYR 20ML ECCENTRIC (SYRINGE) ×8 IMPLANT
TOWEL OR 17X24 6PK STRL BLUE (TOWEL DISPOSABLE) ×4 IMPLANT
TOWEL OR 17X26 10 PK STRL BLUE (TOWEL DISPOSABLE) ×8 IMPLANT
TRAY FOLEY CATH 14FRSI W/METER (CATHETERS) ×4 IMPLANT
WATER STERILE IRR 1000ML POUR (IV SOLUTION) ×6 IMPLANT

## 2014-08-29 NOTE — Op Note (Signed)
Preoperative diagnosis: Lumbar spinal stenosis L1-2,L4 5 L5-S1 degenerative scoliosis L1-2,L4 5, L5-S1 lumbar radiculopathy left L2 and left and right L4-5 and S1.  Postoperative diagnosis: Same  Procedure: Decompressive lumbar laminectomies L1-2, L L4 L 5 L5-S1 in excess and requiring more work there will be needed with a standard interbody fusion with complete medial facetectomies radical foraminotomies of the L1 L2, L4, L5, and S1.  #2 transforaminal lumbar interbody fusion L1-L2 use in the globus Alterra expandable peek cage packed with locally harvested autograft mixed with allostem  #3 posterior lumbar interbody fusion L4-5 and L5-S1 using the globus arise expandable peek cages packed with locally harvested autograft mixed with allostem  #4 pedicle fixation L1-L2 as well as L4-S1 utilizing the globus Revere 6.35 pedicle screw system  #5 exploration of fusion removal of hardware L2-L4  #6 posterior lateral arthrodesis L1-L2, L4-S1  #7 placement of large Hemovac drain  Surgeon: Francesca Jewett  Assistant: Leeroy Cha  Anesthesia: Gen.  EBL: Minimal  History of present illness: Patient is a very pleasant 59 year old female who had previous L2-L4 fusion who initially did very well however velocities months to progress worsening back pain and bilateral leg pain workup revealed progressive degenerative scoliosis collapse and deterioration at L1-L2 as well as at L4-L5 and L5-S1. Patient failed all forms of conservative treatment imaging findings and progressive conical syndrome I recommended decompress and stabilize these procedures at those 3 levels. I extensively reviewed the risks and benefits of the operation with the patient as well as perioperative Of course expectations about, alternatives to surgery and she understood and agreed to proceed forward.  Operative procedure: Patient brought into the ER was induced under general anesthesia position prone on Wilson frame her back was prepped  and draped in routine sterile fashion. Her old incision was opened up and extended cephalad caudally the scar tissue was dissected free and subperiosteal dissection was carried out on on the lamina of L1-L2 and all are down to S1. The old hardware was immediately identified the fusion was felt to be solid I took off the crossing disconnected not to remove the rods continue to inspect the fusion which appeared to be solid, so I removed the L3 screws. Then began the decompressive laminectomy from L4-S1 with removal of spinous processes at L4-L5 began center decompression work to extensive amount of scar tissue and did be complete medial facetectomies at L4-5 and L5-S1. I also perform radical foraminotomies of the L4, L5 and S1 nerve roots bilaterally. After I cleaned up all the spondylosis and severe neural foraminal stenosis I incised the disc spaces cleaned up both sides with sequential distraction first working at L4-5 I opened up the space and prepare the endplates removed and extensive amount of lateral and central disc ultimate after complete discectomy and endplate preparation 9 inserted a 8-14 mm expandable peek cage at L4-5 and L5-S1 performed a similar fashion on the contralateral side packed local autograft mix centrally and then inserted the contralateral cages. After WAS inserted I placed the L5 and S1 screws bilaterally all screws purchase. Postop fluoroscopy confirmed good position of the implants in no L4-S1 area. I then took attention to the L1-L2 again performed a complete central decompression complete you facetectomies and radical foraminotomies of the L1-L2 nerve root the L1 nerve root on the right was displaced and overriding the disc space the L1 nerve root on the left was in proper position. This space and cleaned out and incised bilaterally using distracted open up the disc  space on the right which significantly reduced scoliotic deformity. Then using curettes shavers due to her the cleaned  out the endplates old across midline. Used to trial and selected an 8 mm expandable Alterra cage packed it with the autograft mix and inserted it after loading the anterior margins disc space with autograft mix. I also packed allostem sponges anteriorly and laterally. This significantly reduces scoliotic deformity at L1-L2 placed the new screws at L1 then the wound was irrigated with the same states was maintain aggressive decortication was care of TB is lateral gutters at L1-L2 as well as L4-S1. Local autograft next was packed posterolaterally at all levels. Rods were sized selected and inserted top tightened nuts were tightened down I compressed L1 against L2 to help reduce some of the kyphotic deformity anomaly on the right but not the left. Then after all the hardware placed and fluoroscopy confirmed good position of all implants I closed this incision with interrupted Vicryl after placement of a large Hemovac drain and a running 4 subcuticular Dermabond benzoin and Steris 6. I then turned my attention to the transplant a discectomy which will be dictated a separate operative note.

## 2014-08-29 NOTE — Progress Notes (Signed)
Patient admitted via PACU. Patient drowsy but easily arousable.Patient made comfortable

## 2014-08-29 NOTE — H&P (Signed)
Sara Cross is an 59 y.o. female.   Chief Complaint: Back pain right chest wall radicular symptoms lumbar radiculopathy HPI: Patient is a very pleasant 59 year old female whose had long-standing issues with her neck and her low back she's undergone previous L2-L4 lumbar fusion and has had progressive worsening pain that starts in the upper part of her back radiates around her right chest wall about halfway between her nipple line and her umbilicus. In addition she's had progressive worsening low back pain radiating into her groin as well as down the backs from legs to the bottom her feet and top of her feet. Workup has revealed in her thoracic spine disc herniations centrally and at T6-7 rightward at T7-8 as well as significant deformity and degenerative scoliosis spondylosis and spinal stenosis at L1-2 L4-5 and L5-S1. Due to her failure conservative treatment with him and with physical therapy epidural steroid injections anti-inflammatories and time I've recommended transpedicular discectomy at T7-8 as I did not feel we could get the disc out at T6-7 through a transpedicular approach and I think most of her symptoms represent a T8 radiculopathy in addition extension of her fusion from L2 4 to L1 and down to S1. I have extensively reviewed the risks and benefits of the operation with the patient as well as perioperative course expectations of outcome and alternatives surgery and she understands and agrees to proceed forward.  Past Medical History  Diagnosis Date  . Depression   . Pneumonia     hx of  . Arthritis   . Morbid obesity   . Anxiety   . Sleep apnea     wear cpap nightly  . GERD (gastroesophageal reflux disease)   . Headache(784.0)   . Dizziness   . DDD (degenerative disc disease)   . Hypertension   . Hyperlipidemia   . Diabetes mellitus     cbg fasting 170-200    Past Surgical History  Procedure Laterality Date  . Back surgery      01/2011  . Hand surgery      right   2011     FOR INJURY   . Cesearean      X2  . Toenail excision      BIG TOE RIGHT FOOT  . Toe nail removal      right great toe  . Abdominal hysterectomy  2004  . Colonoscopy    . Anterior cervical decomp/discectomy fusion  12/23/2011    Procedure: ANTERIOR CERVICAL DECOMPRESSION/DISCECTOMY FUSION 2 LEVELS;  Surgeon: Elaina Hoops, MD;  Location: [place] [facility];  Service: Neurosurgery;  Laterality: N/A;  Anterior Cervical Four-Five/Five-Six Decompression and Fusion  . Carpal tunnel release  12/23/2011    Procedure: CARPAL TUNNEL RELEASE;  Surgeon: Elaina Hoops, MD;  Location: [place] [facility];  Service: Neurosurgery;  Laterality: Left;  Left Carpal Tunnel Release  . Anterior cervical decomp/discectomy fusion N/A 06/07/2013    Procedure: ANTERIOR CERVICAL DECOMPRESSION/DISCECTOMY FUSION 1 LEVEL/HARDWARE REMOVAL;  Surgeon: Elaina Hoops, MD;  Location: [place] [facility];  Service: Neurosurgery;  Laterality: N/A;  ANTERIOR CERVICAL DECOMPRESSION/DISCECTOMY FUSION 1 LEVEL/HARDWARE REMOVAL    Family History  Problem Relation Age of Onset  . Anesthesia problems Neg Hx   . Hypotension Neg Hx   . Malignant hyperthermia Neg Hx   . Pseudochol deficiency Neg Hx   . Cancer Father     ORAL  . Hypertension Other   . Hyperlipidemia Other   . Sleep apnea Other   . Obesity Other   .  Arthritis Mother   . Diabetes Sister   . Hyperlipidemia Sister   . Diabetes Maternal Grandmother   . Hypertension Maternal Grandmother   . Hyperlipidemia Maternal Grandmother   . Heart disease Maternal Grandmother    Social History:  reports that she quit smoking about 7 weeks ago. Her smoking use included Cigarettes. She has a 7.5 pack-year smoking history. She has never used smokeless tobacco. She reports that she drinks alcohol. She reports that she does not use illicit drugs.  Allergies:  Allergies  Allergen Reactions  . Benicar [Olmesartan]   . Lortab [Hydrocodone-Acetaminophen] Itching  . Ziac [Bisoprolol-Hydrochlorothiazide]      Medications Prior to Admission  Medication Sig Dispense Refill  . acetaminophen (TYLENOL) 500 MG tablet Take 1,000 mg by mouth every 6 (six) hours as needed.    . ALPRAZolam (XANAX) 1 MG tablet TAKE ONE TABLET BY MOUTH TWICE DAILY 60 tablet 0  . amphetamine-dextroamphetamine (ADDERALL) 20 MG tablet Take 1/2 to 1 tablet 2 or 3 x day if needed for ADD 90 tablet 0  . B Complex-C (SUPER B COMPLEX PO) Take 1 tablet by mouth daily.    Marland Kitchen buPROPion (WELLBUTRIN XL) 300 MG 24 hr tablet Take 1 tablet (300 mg total) by mouth daily. 30 tablet 2  . Cholecalciferol (VITAMIN D PO) Take 1 capsule by mouth daily.    . cyclobenzaprine (FLEXERIL) 10 MG tablet Take 1 tablet (10 mg total) by mouth at bedtime. 90 tablet 0  . furosemide (LASIX) 20 MG tablet TAKE TWO TABLETS BY MOUTH ONCE DAILY 180 tablet 0  . ibuprofen (ADVIL,MOTRIN) 200 MG tablet Take 800 mg by mouth every 4 (four) hours as needed.    Marland Kitchen lisinopril (PRINIVIL,ZESTRIL) 20 MG tablet Take 20 mg by mouth daily.    . metFORMIN (GLUCOPHAGE-XR) 500 MG 24 hr tablet TAKE TWO TABLETS BY MOUTH TWICE DAILY 360 tablet 5  . omeprazole (PRILOSEC) 20 MG capsule Take 1 capsule (20 mg total) by mouth daily. (Patient taking differently: Take 20 mg by mouth daily as needed. ) 90 capsule 1  . oxyCODONE-acetaminophen (PERCOCET/ROXICET) 5-325 MG per tablet Take 1 tablet by mouth every 6 (six) hours as needed for pain. 60 tablet 0  . VITAMIN A EX Apply topically daily.    . vitamin E (VITAMIN E) 400 UNIT capsule Take 800 Units by mouth daily.    . Fluticasone Furoate-Vilanterol (BREO ELLIPTA) 100-25 MCG/INH AEPB 1 puff daily for breathing 60 each 5  . lisinopril (PRINIVIL,ZESTRIL) 40 MG tablet TAKE ONE TABLET BY MOUTH ONCE DAILY 30 tablet 5  . MAGNESIUM PO Take 400 mg by mouth daily.       No results found for this or any previous visit (from the past 48 hour(s)). No results found.  Review of Systems  Constitutional: Negative.   Eyes: Negative.   Respiratory:  Negative.   Cardiovascular: Negative.   Gastrointestinal: Negative.   Genitourinary: Negative.   Musculoskeletal: Positive for myalgias, back pain and neck pain.  Skin: Negative.   Neurological: Positive for tingling and sensory change.  Psychiatric/Behavioral: Negative.     Blood pressure 104/60, pulse 86, temperature 98 F (36.7 C), temperature source Oral, resp. rate 18, height 5\' 2"  (1.575 m), weight 90.719 kg (200 lb), SpO2 100 %. Physical Exam  Constitutional: She is oriented to person, place, and time. She appears well-developed and well-nourished.  HENT:  Head: Normocephalic.  Eyes: Pupils are equal, round, and reactive to light.  Neck: Normal range of motion.  Respiratory:  Effort normal.  GI: Soft.  Neurological: She is alert and oriented to person, place, and time. She has normal strength. GCS eye subscore is 4. GCS verbal subscore is 5. GCS motor subscore is 6.  Strength is 5 out of 5 in her iliopsoas, quads, hamstrings, gastrocs, anterior tibialis, EHL.  Skin: Skin is warm and dry.     Assessment/Plan 59 year old female presents for a T7-8 transpedicular discectomy and L1-S1 lumbar decompression and fusion  Sara Cross P 08/29/2014, 7:19 AM

## 2014-08-29 NOTE — Progress Notes (Signed)
Notified pharmacy and also called regarding patient's antibiotics. Reported to the in-coming nurse that patient still needs antibiotics.

## 2014-08-29 NOTE — OR Nursing (Signed)
Updated family at 11:33.

## 2014-08-29 NOTE — Anesthesia Postprocedure Evaluation (Signed)
  Anesthesia Post-op Note  Patient: Sara Cross  Procedure(s) Performed: Procedure(s): Lumbar One-Two,Lumbar Four-Five,Lumbar Five-Sacral One  posterior lumbar interbody fusion with interbody prosthesis posterior lateral arthrodesis and posterior segmental instrumentation with Thoracic Seven-Eight microdiskectomy (N/A) Thoracic seven-eight  microdiskectomy (N/A)  Patient Location: PACU  Anesthesia Type:General  Level of Consciousness: awake, oriented, sedated and patient cooperative  Airway and Oxygen Therapy: Patient Spontanous Breathing  Post-op Pain: moderate  Post-op Assessment: Post-op Vital signs reviewed, Patient's Cardiovascular Status Stable, Respiratory Function Stable, Patent Airway, No signs of Nausea or vomiting and Pain level controlled  Post-op Vital Signs: stable  Last Vitals:  Filed Vitals:   08/29/14 1645  BP: 111/57  Pulse: 95  Temp:   Resp: 12    Complications: No apparent anesthesia complications

## 2014-08-29 NOTE — Transfer of Care (Signed)
Immediate Anesthesia Transfer of Care Note  Patient: Sara Cross  Procedure(s) Performed: Procedure(s): Lumbar One-Two,Lumbar Four-Five,Lumbar Five-Sacral One  posterior lumbar interbody fusion with interbody prosthesis posterior lateral arthrodesis and posterior segmental instrumentation with Thoracic Seven-Eight microdiskectomy (N/A) Thoracic seven-eight  microdiskectomy (N/A)  Patient Location: PACU  Anesthesia Type:General  Level of Consciousness: awake, alert  and oriented  Airway & Oxygen Therapy: Patient Spontanous Breathing and Patient connected to face mask oxygen  Post-op Assessment: Report given to PACU RN and Post -op Vital signs reviewed and stable  Post vital signs: Reviewed and stable  Complications: No apparent anesthesia complications

## 2014-08-29 NOTE — Op Note (Signed)
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  Postoperative diagnosis: Same  Procedure: Transpedicular discectomy and decompression at T7-T8 with foraminotomies of the right T7 and T8 nerve roots.   surgeon: Kary Kos  Assistant: Leeroy Cha anesthesia: Gen.  EBL: Will  History of present illness: Patient had lateral right chest wall pain rating for back underneath her chest and above her umbilicus and was consistent with a T8 nerve are pattern. Workup showed spondylosis at T6-7 and T7-T8 however the T6-7 was probably a central disc that would not be amenable to a transpedicular decompression discectomy however most recent hematology seem to be T8 and she had spondylosis and foraminal disc at this level.*Recommended at T7-T8 transport occurred discectomy and foraminotomy the T7 nerve root. I extensively went over the risks and benefits of The patient as well as perioperative course expectations of outcome alternatives to surgery and she understood and agreed to proceed forward.  Operative procedure: After completion of the lower lumbar decompression and fusion fluoroscopy confirmed the appropriate level of the T8 pedicle and the incision was implanted with 10 mL lidocaine with epi involving the chiropractic that was used to take down subcutaneous tissue on the right exposing the lamina T7-T8 confirmed by interoperative x-ray. The undersurface limited T7 mucus a complex super as limited T8 was without a high-speed drill laminotomy was begun the T8 pedicle was identified under microscopic illumination the epidural veins identified the T8 nerve root drilled laterally in the medial aspect of the facet complex and down through the superior aspect of the T8 pedicle into this space and size of disc space level scalpel and removed some soft disc herniation causing displacement of thecal sac and right T8 nerve root as well as some spondylosis and spur. I tracked  laminotomy up to the undersurface of T7 nerve root and easily explored all distance of T7 over to confirm decompression. Stark Klein was comes here to see Mrs. was maintained the wound was closed in layers with interrupted Vicryl and skin is covering for subcuticular benzoin and Steri-Strips applied patient recovery room in stable condition. At the end of the case all needle counts and sponge counts were correct.

## 2014-08-29 NOTE — Anesthesia Preprocedure Evaluation (Signed)
Anesthesia Evaluation  Patient identified by MRN, date of birth, ID band Patient awake    Reviewed: Allergy & Precautions, NPO status , Patient's Chart, lab work & pertinent test results  Airway        Dental   Pulmonary sleep apnea , former smoker,          Cardiovascular hypertension,     Neuro/Psych  Headaches,    GI/Hepatic GERD-  ,  Endo/Other  diabetes, Type 2, Oral Hypoglycemic Agents  Renal/GU      Musculoskeletal  (+) Arthritis -,   Abdominal   Peds  Hematology   Anesthesia Other Findings   Reproductive/Obstetrics                             Anesthesia Physical Anesthesia Plan  ASA: III  Anesthesia Plan: General   Post-op Pain Management:    Induction: Intravenous  Airway Management Planned: Oral ETT  Additional Equipment:   Intra-op Plan:   Post-operative Plan: Extubation in OR  Informed Consent: I have reviewed the patients History and Physical, chart, labs and discussed the procedure including the risks, benefits and alternatives for the proposed anesthesia with the patient or authorized representative who has indicated his/her understanding and acceptance.     Plan Discussed with: CRNA, Anesthesiologist and Surgeon  Anesthesia Plan Comments:         Anesthesia Quick Evaluation

## 2014-08-29 NOTE — Anesthesia Procedure Notes (Signed)
Procedure Name: Intubation Date/Time: 08/29/2014 8:03 AM Performed by: Maude Leriche D Pre-anesthesia Checklist: Patient identified, Emergency Drugs available, Suction available, Patient being monitored and Timeout performed Patient Re-evaluated:Patient Re-evaluated prior to inductionOxygen Delivery Method: Circle system utilized Preoxygenation: Pre-oxygenation with 100% oxygen Intubation Type: IV induction Ventilation: Mask ventilation without difficulty Laryngoscope Size: Miller and 2 Grade View: Grade I Tube type: Oral Tube size: 7.5 mm Number of attempts: 1 Airway Equipment and Method: Stylet Placement Confirmation: ETT inserted through vocal cords under direct vision,  positive ETCO2 and breath sounds checked- equal and bilateral Secured at: 21 cm Tube secured with: Tape Dental Injury: Teeth and Oropharynx as per pre-operative assessment

## 2014-08-29 NOTE — OR Nursing (Signed)
Updated family @ 14:00.

## 2014-08-29 NOTE — Progress Notes (Signed)
Pt placed on CPAP at this time. Pt has home mask and tubing just needed machine. Pt was on 1.5 L [place] so RT bled in 1.5L oxygen through CPAP. Pt was placed on auto due to unknown home setting. Pt tolerating well.

## 2014-08-30 LAB — CBC
HCT: 26.9 % — ABNORMAL LOW (ref 36.0–46.0)
Hemoglobin: 9.2 g/dL — ABNORMAL LOW (ref 12.0–15.0)
MCH: 30.8 pg (ref 26.0–34.0)
MCHC: 34.2 g/dL (ref 30.0–36.0)
MCV: 90 fL (ref 78.0–100.0)
Platelets: 192 10*3/uL (ref 150–400)
RBC: 2.99 MIL/uL — ABNORMAL LOW (ref 3.87–5.11)
RDW: 13.7 % (ref 11.5–15.5)
WBC: 12 10*3/uL — ABNORMAL HIGH (ref 4.0–10.5)

## 2014-08-30 MED ORDER — MORPHINE SULFATE 2 MG/ML IJ SOLN
1.0000 mg | INTRAMUSCULAR | Status: DC | PRN
  Administered 2014-08-30 – 2014-09-01 (×11): 1 mg via INTRAVENOUS
  Filled 2014-08-30 (×12): qty 1

## 2014-08-30 MED ORDER — SODIUM CHLORIDE 0.9 % IV SOLN
INTRAVENOUS | Status: DC
  Administered 2014-08-30 – 2014-08-31 (×3): via INTRAVENOUS

## 2014-08-30 MED ORDER — OXYCODONE HCL 5 MG PO TABS
5.0000 mg | ORAL_TABLET | ORAL | Status: DC | PRN
  Administered 2014-08-30 – 2014-08-31 (×6): 5 mg via ORAL
  Filled 2014-08-30 (×8): qty 1

## 2014-08-30 NOTE — Evaluation (Signed)
Occupational Therapy Evaluation Patient Details Name: Sara Cross MRN: [id number] DOB: 1956/08/15 Today's Date: 08/30/2014    History of Present Illness Patient is a very pleasant 59 year old female who had previous L2-L4 fusion who initially did very well however velocities months to progress worsening back pain and bilateral leg pain workup revealed progressive degenerative scoliosis collapse and deterioration at L1-L2 as well as at L4-L5 and L5-S1. Pt is now s/p decompressive lumbar laminectomies L1-2, L4-S1; TLIF L1-L2.   Clinical Impression   PTA pt lived at home and was independent with ADLs. Pt is limited by pain today, however progressing with mobility at min guard level. Pt requires assist for LB ADLs due to pain and back precautions and would benefit from acute OT to promote independence with ADLs.     Follow Up Recommendations  No OT follow up;Supervision/Assistance - 24 hour    Equipment Recommendations  None recommended by OT    Recommendations for Other Services       Precautions / Restrictions Precautions Precautions: Fall;Back Precaution Booklet Issued: Yes (comment) Precaution Comments: educated pt on 3/3 back precautions and incorporating into ADls Required Braces or Orthoses: Spinal Brace Spinal Brace: Lumbar corset;Applied in sitting position Restrictions Weight Bearing Restrictions: No      Mobility Bed Mobility Overal bed mobility: Needs Assistance Bed Mobility: Rolling;Sidelying to Sit;Sit to Sidelying Rolling: Min guard Sidelying to sit: Min assist     Sit to sidelying: Min guard General bed mobility comments: VC's for sequencing and technique for log roll. Assist to elevate trunk to full sitting position while maintaining back precautions. Slow to return to bed with VC's but no physical assist needed.   Transfers Overall transfer level: Needs assistance Equipment used: Rolling walker (2 wheeled) Transfers: Sit to/from Stand Sit to Stand: Min  assist         General transfer comment: VC's for hand placement on seated surface for safety. Assist to power-up to full standing position.    Balance Overall balance assessment: Needs assistance Sitting-balance support: No upper extremity supported;Feet supported Sitting balance-Leahy Scale: Good     Standing balance support: Bilateral upper extremity supported;During functional activity Standing balance-Leahy Scale: Poor Standing balance comment: Requires UE support to maintain balance at this time.                             ADL Overall ADL's : Needs assistance/impaired Eating/Feeding: Independent;Sitting   Grooming: Min guard;Standing   Upper Body Bathing: Set up;Sitting   Lower Body Bathing: Sit to/from stand;Minimal assistance   Upper Body Dressing : Minimal assistance;Sitting Upper Body Dressing Details (indicate cue type and reason): including back brace Lower Body Dressing: Moderate assistance;Sit to/from stand   Toilet Transfer: Min guard;Ambulation;Comfort height toilet;RW   Toileting- Water quality scientist and Hygiene: Min guard;Sit to/from stand;Cueing for sequencing;Cueing for safety       Functional mobility during ADLs: Min guard;Rolling walker General ADL Comments: Educated pt on compensatory techniques and incorporating back precautions into ADLs. Pt will have husband or family assist 24/7. Pt is limited by pain today, but encouraged OOB for meals tomorrow.      Vision  Pt reports no change from baseline.                    Perception Perception Perception Tested?: No   Praxis Praxis Praxis tested?: Within functional limits    Pertinent Vitals/Pain Pain Assessment: 0-10 Pain Score: 9  Pain Location: back  Pain Descriptors / Indicators: Aching Pain Intervention(s): Limited activity within patient's tolerance;Monitored during session;Repositioned;Patient requesting pain meds-RN notified     Hand Dominance Right    Extremity/Trunk Assessment Upper Extremity Assessment Upper Extremity Assessment: Overall WFL for tasks assessed   Lower Extremity Assessment Lower Extremity Assessment: Generalized weakness   Cervical / Trunk Assessment Cervical / Trunk Assessment: Normal   Communication Communication Communication: No difficulties   Cognition Arousal/Alertness: Awake/alert Behavior During Therapy: WFL for tasks assessed/performed Overall Cognitive Status: Within Functional Limits for tasks assessed                                Home Living Family/patient expects to be discharged to:: Private residence Living Arrangements: Spouse/significant other Available Help at Discharge: Family;Available 24 hours/day Type of Home: House Home Access: Stairs to enter [employer] of Steps: 2 Entrance Stairs-Rails: Right;Left Home Layout: One level     Bathroom Shower/Tub: Occupational psychologist: Standard     Home Equipment: Cane - single point;Walker - 2 wheels;Shower seat - built in          Prior Functioning/Environment Level of Independence: Independent        Comments: Still driving, does not work    OT Diagnosis: Generalized weakness;Acute pain   OT Problem List: Decreased strength;Decreased range of motion;Decreased activity tolerance;Impaired balance (sitting and/or standing);Decreased knowledge of use of DME or AE;Decreased knowledge of precautions;Pain   OT Treatment/Interventions: Self-care/ADL training;Therapeutic exercise;Energy conservation;DME and/or AE instruction;Therapeutic activities;Patient/family education;Balance training    OT Goals(Current goals can be found in the care plan section) Acute Rehab OT Goals Patient Stated Goal: Return home with family OT Goal Formulation: With patient Time For Goal Achievement: 09/13/14 Potential to Achieve Goals: Good ADL Goals Pt Will Perform Grooming: with modified independence;standing Pt Will  Perform Lower Body Bathing: with set-up;with supervision;sit to/from stand Pt Will Perform Lower Body Dressing: with set-up;with supervision;sit to/from stand Pt Will Transfer to Toilet: with modified independence;ambulating Pt Will Perform Toileting - Clothing Manipulation and hygiene: with modified independence;sit to/from stand  OT Frequency: Min 2X/week    End of Session Equipment Utilized During Treatment: Gait belt;Rolling walker;Back brace Nurse Communication: Mobility status;Patient requests pain meds  Activity Tolerance: Patient limited by pain Patient left: in bed;with call bell/phone within reach;with bed alarm set;with family/visitor present   Time: 1349-1415 OT Time Calculation (min): 26 min Charges:  OT General Charges $OT Visit: 1 Procedure OT Evaluation $Initial OT Evaluation Tier I: 1 Procedure OT Treatments $Self Care/Home Management : 8-22 mins G-Codes:    Villa Herb M September 29, 2014, 2:42 PM  Cyndie Chime, OTR/L Occupational Therapist [phone] (pager)

## 2014-08-30 NOTE — Progress Notes (Signed)
Spoke with Joya Salm again about results of CBC and BP still low 100s SBP. Discontinue dilaudid and institute oxycodone q4PRN and morphine PRN. Will continue to monitor. Joaquin Bend E, RN 08/30/2014 2:35 AM

## 2014-08-30 NOTE — Progress Notes (Signed)
OT Cancellation Note  Patient Details Name: VEDIKA DUMLAO MRN: [id number] DOB: 09-Dec-1955   Cancelled Treatment:    Reason Eval/Treat Not Completed: Patient declined, no reason specified. Pt with increased pain and requested that OT return later today. OT will re-attempt evaluation as available.   Villa Herb M   Cyndie Chime, OTR/L Occupational Therapist [phone] (pager)  08/30/2014, 11:07 AM

## 2014-08-30 NOTE — Progress Notes (Signed)
Spoke with Botero about BP of 99/58 and decided to start NS at 122ml/hr and ordered a CBC. Will continue to monitor. Joaquin Bend E, RN  08/30/2014 12:54 AM

## 2014-08-30 NOTE — Progress Notes (Signed)
Pt.'s BP was too low to administer dilaudid and I wasted in sharps container in room. Joaquin Bend E, [place] 08/30/2014 [phone]

## 2014-08-30 NOTE — Progress Notes (Signed)
Filed Vitals:   08/30/14 0144 08/30/14 0222 08/30/14 0415 08/30/14 0520  BP: 107/60 102/62 94/52 99/59   Pulse: 91 78 92 94  Temp: 98.4 F (36.9 C)     TempSrc: Axillary   Oral  Resp: 18   18  Height:      Weight:      SpO2: 100%   97%    CBC  Recent Labs  08/29/14 1325 08/30/14 0134  WBC  --  12.0*  HGB 9.9* 9.2*  HCT 29.0* 26.9*  PLT  --  192   BMET  Recent Labs  08/29/14 1325  NA 138  K 4.6  GLUCOSE 112*    Patient resting in bed, significant pain, including incisional pain as well as pain over the hips bilaterally, despite IV morphine and OxyIR 15 mg every 4 hours. Has not been given muscle relaxant recently, vast nurse to give Flexeril as ordered. May need to consider Toradol IV if pain control does not improve. Being seen by PT. Foley DC'd earlier this morning, no void yet, nursing staff to closely monitor voiding function.  Plan: Continue to progress through postoperative recovery.  Hosie Spangle, MD 08/30/2014, 8:47 AM

## 2014-08-30 NOTE — Evaluation (Signed)
Physical Therapy Evaluation Patient Details Name: Sara Cross MRN: [id number] DOB: September 02, 1955 Today's Date: 08/30/2014   History of Present Illness  Patient is a very pleasant 59 year old female who had previous L2-L4 fusion who initially did very well however velocities months to progress worsening back pain and bilateral leg pain workup revealed progressive degenerative scoliosis collapse and deterioration at L1-L2 as well as at L4-L5 and L5-S1. Pt is now s/p decompressive lumbar laminectomies L1-2, L4-S1; TLIF L1-L2.  Clinical Impression  Pt admitted with above diagnosis. Pt currently with functional limitations due to the deficits listed below (see PT Problem List). At the time of PT eval pt was able to perform transfers with min-mod assist, and ambulation with min guard assist. Pt limited by pain. Pt will benefit from skilled PT to increase their independence and safety with mobility to allow discharge to the venue listed below.       Follow Up Recommendations Outpatient PT (As appropriate per post-op protocol )    Equipment Recommendations  Rolling walker with 5" wheels;3in1 (PT)    Recommendations for Other Services       Precautions / Restrictions Precautions Precautions: Fall;Back Precaution Booklet Issued: Yes (comment) Precaution Comments: Drain Required Braces or Orthoses: Spinal Brace Spinal Brace: Lumbar corset;Applied in sitting position Restrictions Weight Bearing Restrictions: No      Mobility  Bed Mobility Overal bed mobility: Needs Assistance Bed Mobility: Rolling;Sidelying to Sit Rolling: Min assist Sidelying to sit: Mod assist       General bed mobility comments: VC's for sequencing and technique for log roll. Assist to elevate trunk to full sitting position while maintaining back precautions.   Transfers Overall transfer level: Needs assistance Equipment used: Rolling walker (2 wheeled) Transfers: Sit to/from Stand Sit to Stand: Min assist          General transfer comment: VC's for hand placement on seated surface for safety. Assist to power-up to full standing position.  Ambulation/Gait Ambulation/Gait assistance: Min guard Ambulation Distance (Feet): 50 Feet Assistive device: Rolling walker (2 wheeled) Gait Pattern/deviations: Step-through pattern;Decreased stride length Gait velocity: Decreased Gait velocity interpretation: Below normal speed for age/gender General Gait Details: Slow and guarded due to pain. Pt began feeling nauseated and gait training was ended.   Stairs            Wheelchair Mobility    Modified Rankin (Stroke Patients Only)       Balance Overall balance assessment: Needs assistance Sitting-balance support: Feet supported;No upper extremity supported Sitting balance-Leahy Scale: Fair     Standing balance support: Bilateral upper extremity supported Standing balance-Leahy Scale: Poor Standing balance comment: Requires UE support to maintain balance at this time.                              Pertinent Vitals/Pain Pain Assessment: 0-10 Pain Score: 10-Worst pain ever Pain Location: Back Pain Intervention(s): Limited activity within patient's tolerance;Monitored during session;Repositioned    Home Living Family/patient expects to be discharged to:: Private residence Living Arrangements: Spouse/significant other Available Help at Discharge: Family;Available 24 hours/day Type of Home: House Home Access: Stairs to enter Entrance Stairs-Rails: Psychiatric nurse of Steps: 2 Home Layout: One level Home Equipment: Cane - single point;Walker - 2 wheels      Prior Function Level of Independence: Independent         Comments: Still driving, does not work     Hand Dominance   Dominant Hand: Right  Extremity/Trunk Assessment   Upper Extremity Assessment: Overall WFL for tasks assessed           Lower Extremity Assessment: Generalized weakness  (Due to pain )      Cervical / Trunk Assessment: Normal  Communication   Communication: No difficulties  Cognition Arousal/Alertness: Awake/alert Behavior During Therapy: WFL for tasks assessed/performed Overall Cognitive Status: Within Functional Limits for tasks assessed                      General Comments      Exercises        Assessment/Plan    PT Assessment Patient needs continued PT services  PT Diagnosis Difficulty walking;Acute pain   PT Problem List Decreased strength;Decreased range of motion;Decreased activity tolerance;Decreased balance;Decreased mobility;Decreased coordination;Decreased knowledge of use of DME;Decreased safety awareness;Decreased knowledge of precautions;Pain  PT Treatment Interventions DME instruction;Gait training;Stair training;Functional mobility training;Therapeutic activities;Therapeutic exercise;Neuromuscular re-education;Patient/family education   PT Goals (Current goals can be found in the Care Plan section) Acute Rehab PT Goals Patient Stated Goal: Return home with family PT Goal Formulation: With patient/family Time For Goal Achievement: 09/06/14 Potential to Achieve Goals: Good    Frequency Min 5X/week   Barriers to discharge        Co-evaluation               End of Session Equipment Utilized During Treatment: Back brace Activity Tolerance: Patient limited by pain Patient left: in chair;with call bell/phone within reach;with family/visitor present Nurse Communication: Mobility status         Time: 0830-0859 PT Time Calculation (min) (ACUTE ONLY): 29 min   Charges:   PT Evaluation $Initial PT Evaluation Tier I: 1 Procedure PT Treatments $Gait Training: 8-22 mins $Therapeutic Activity: 8-22 mins   PT G Codes:        Rolinda Roan 2014/09/28, 9:21 AM   Rolinda Roan, PT, DPT Acute Rehabilitation Services Pager: [phone]

## 2014-08-31 LAB — GLUCOSE, CAPILLARY: GLUCOSE-CAPILLARY: 119 mg/dL — AB (ref 70–99)

## 2014-08-31 NOTE — Progress Notes (Signed)
Patient ID: Sara Cross, female   DOB: [birth year], 57 y.o.   MRN: [id number] Ambulating with help, still some drainage. No weakness

## 2014-08-31 NOTE — Progress Notes (Signed)
Rt Note:  Pt places self on/off our mach9ine with her tubing and mask.  RT will continue to monitor.

## 2014-08-31 NOTE — Progress Notes (Signed)
Physical Therapy Treatment Patient Details Name: Sara Cross MRN: [id number] DOB: 01/08/1956 Today's Date: 08/31/2014    History of Present Illness Patient is a very pleasant 59 year old female who had previous L2-L4 fusion who initially did very well however velocities months to progress worsening back pain and bilateral leg pain workup revealed progressive degenerative scoliosis collapse and deterioration at L1-L2 as well as at L4-L5 and L5-S1. Pt is now s/p decompressive lumbar laminectomies L1-2, L4-S1; TLIF L1-L2.    PT Comments    Pt progressing well with mobility.  Increased ambulation distance.  Encouraged pt to increase activity & ambulate with Nsing 2-3x's/day.  Cont with current POC to maximize functional mobility prior to d/c home.    Follow Up Recommendations  Outpatient PT     Equipment Recommendations  Rolling walker with 5" wheels;3in1 (PT)    Recommendations for Other Services       Precautions / Restrictions Precautions Precautions: Fall;Back Precaution Comments: educated pt on 3/3 back precautions and incorporating into ADls Required Braces or Orthoses: Spinal Brace Spinal Brace: Lumbar corset;Applied in sitting position Restrictions Weight Bearing Restrictions: No    Mobility  Bed Mobility Overal bed mobility: Needs Assistance Bed Mobility: Rolling;Sidelying to Sit;Sit to Sidelying Rolling: Min guard Sidelying to sit: Min assist     Sit to sidelying: Min assist General bed mobility comments: (A) to elevate shoulders/trunk to sitting upright, & to lift LE's back into bed.  Cues for technique.    Transfers Overall transfer level: Needs assistance Equipment used: Rolling walker (2 wheeled) Transfers: Sit to/from Stand Sit to Stand: Min guard         General transfer comment: cues for hand placement  Ambulation/Gait Ambulation/Gait assistance: Min guard Ambulation Distance (Feet): 150 Feet Assistive device: Rolling walker (2 wheeled) Gait  Pattern/deviations: Step-through pattern;Decreased stride length Gait velocity: Decreased   General Gait Details: slow & guarded due to pain.    Stairs            Wheelchair Mobility    Modified Rankin (Stroke Patients Only)       Balance                                    Cognition Arousal/Alertness: Awake/alert Behavior During Therapy: WFL for tasks assessed/performed Overall Cognitive Status: Within Functional Limits for tasks assessed                      Exercises      General Comments        Pertinent Vitals/Pain Pain Assessment: 0-10 Pain Score: 7  Pain Location: back Pain Descriptors / Indicators: Burning Pain Intervention(s): Monitored during session;Repositioned;Premedicated before session    Home Living                      Prior Function            PT Goals (current goals can now be found in the care plan section) Acute Rehab PT Goals Patient Stated Goal: Return home with family PT Goal Formulation: With patient/family Time For Goal Achievement: 09/06/14 Potential to Achieve Goals: Good Progress towards PT goals: Progressing toward goals    Frequency  Min 5X/week    PT Plan Current plan remains appropriate    Co-evaluation             End of Session Equipment Utilized During Treatment: Back brace Activity  Tolerance: Patient tolerated treatment well Patient left: in bed;with call bell/phone within reach;with family/visitor present     Time: 0349-1791 PT Time Calculation (min) (ACUTE ONLY): 20 min  Charges:  $Gait Training: 8-22 mins                    G Codes:      Sena Hitch 08/31/2014, 11:31 AM   Sarajane Marek, PTA [phone] 08/31/2014

## 2014-08-31 NOTE — Progress Notes (Signed)
Entered Pt room to find Pt had already placed herself on CPAP.  Pt tolerating well and resting.  RT to monitor as needed.

## 2014-08-31 NOTE — Progress Notes (Addendum)
Occupational Therapy Treatment Patient Details Name: FARAH LEPAK MRN: [id number] DOB: 1955-11-12 Today's Date: 08/31/2014    History of present illness Patient is a very pleasant 59 year old female who had previous L2-L4 fusion who initially did very well however velocities months to progress worsening back pain and bilateral leg pain workup revealed progressive degenerative scoliosis collapse and deterioration at L1-L2 as well as at L4-L5 and L5-S1. Pt is now s/p decompressive lumbar laminectomies L1-2, L4-S1; TLIF L1-L2.   OT comments  Pt progressing. Practiced with AE and education provided in session.  Follow Up Recommendations  No OT follow up;Supervision/Assistance - 24 hour    Equipment Recommendations  3 in 1 bedside comode    Recommendations for Other Services      Precautions / Restrictions Precautions Precautions: Fall;Back Precaution Booklet Issued: No Precaution Comments: reviewed precautions Required Braces or Orthoses: Spinal Brace Spinal Brace: Lumbar corset;Applied in sitting position Restrictions Weight Bearing Restrictions: No       Mobility Bed Mobility Overal bed mobility: Needs Assistance Bed Mobility: Rolling;Sidelying to Sit;Sit to Sidelying Rolling: Supervision (with tactile and verbal cues) Sidelying to sit: Mod assist     Sit to sidelying: Supervision General bed mobility comments: assist with trunk from sidelying to sitting position.  Transfers Overall transfer level: Needs assistance Equipment used: Rolling walker (2 wheeled) Transfers: Sit to/from Stand Sit to Stand: Min guard         General transfer comment: cues for hand placement    Balance                                   ADL Overall ADL's : Needs assistance/impaired     Grooming: Wash/dry face;Standing;Set up;Supervision/safety           Upper Body Dressing : Set up;Supervision/safety;Sitting   Lower Body Dressing: Set up;Supervision/safety;Sit  to/from stand;With adaptive equipment Lower Body Dressing Details (indicate cue type and reason): assist to get socks on sockaid Toilet Transfer: Min guard;Ambulation;RW (chair)   Toileting- Clothing Manipulation and Hygiene: Supervision/safety (hygiene-sitting and clothing-standing) Toileting - Clothing Manipulation Details (indicate cue type and reason): cues for precautions     Functional mobility during ADLs: Min guard;Rolling walker General ADL Comments: Educated on AE/cost/where to purchase. Pt practiced with AE. Educated on use of cup for oral care and placement of grooming items to avoid breaking precautions. Educated on safety. Discussed what pt could use for toilet aide. Educated on 3 in 1. Encouraged pt to get up with nursing and also explained no sitting for more than 1 hour at a time.      Vision                     Perception     Praxis      Cognition  Awake/Alert Behavior During Therapy: WFL for tasks assessed/performed Overall Cognitive Status: Within Functional Limits for tasks assessed                       Extremity/Trunk Assessment               Exercises     Shoulder Instructions       General Comments      Pertinent Vitals/ Pain       Pain Assessment: 0-10 Pain Score: 8  Pain Location: back Pain Descriptors / Indicators: Sore (stinging) Pain Intervention(s): Monitored during session;Repositioned  Home Living  Prior Functioning/Environment              Frequency Min 2X/week     Progress Toward Goals  OT Goals(current goals can now be found in the care plan section)  Progress towards OT goals: Progressing toward goals  Acute Rehab OT Goals Patient Stated Goal: not stated OT Goal Formulation: With patient Time For Goal Achievement: 09/13/14 Potential to Achieve Goals: Good ADL Goals Pt Will Perform Grooming: with modified independence;standing Pt Will  Perform Lower Body Bathing: with set-up;with supervision;sit to/from stand Pt Will Perform Lower Body Dressing: with set-up;with supervision;sit to/from stand Pt Will Transfer to Toilet: with modified independence;ambulating Pt Will Perform Toileting - Clothing Manipulation and hygiene: with modified independence;sit to/from stand  Plan Discharge plan remains appropriate    Co-evaluation                 End of Session Equipment Utilized During Treatment: Gait belt;Rolling walker;Back brace   Activity Tolerance Patient tolerated treatment well   Patient Left in bed;with call bell/phone within reach   Nurse Communication          Time: 2595-6387 OT Time Calculation (min): 30 min  Charges: OT General Charges $OT Visit: 1 Procedure OT Treatments $Self Care/Home Management : 23-37 mins  Benito Mccreedy OTR/L [phone] 08/31/2014, 1:24 PM

## 2014-09-01 ENCOUNTER — Encounter (HOSPITAL_COMMUNITY): Payer: Self-pay | Admitting: Neurosurgery

## 2014-09-01 MED ORDER — POLYETHYLENE GLYCOL 3350 17 G PO PACK
17.0000 g | PACK | Freq: Every day | ORAL | Status: DC
  Administered 2014-09-01 – 2014-09-02 (×2): 17 g via ORAL
  Filled 2014-09-01 (×2): qty 1

## 2014-09-01 MED FILL — Heparin Sodium (Porcine) Inj 1000 Unit/ML: INTRAMUSCULAR | Qty: 30 | Status: AC

## 2014-09-01 MED FILL — Sodium Chloride IV Soln 0.9%: INTRAVENOUS | Qty: 1000 | Status: AC

## 2014-09-01 MED FILL — Sodium Chloride Irrigation Soln 0.9%: Qty: 3000 | Status: AC

## 2014-09-01 NOTE — Progress Notes (Addendum)
Occupational Therapy Treatment Patient Details Name: Sara Cross MRN: [id number] DOB: Nov 06, 1955 Today's Date: 09/01/2014    History of present illness Patient is a very pleasant 59 year old female who had previous L2-L4 fusion who initially did very well however velocities months to progress worsening back pain and bilateral leg pain workup revealed progressive degenerative scoliosis collapse and deterioration at L1-L2 as well as at L4-L5 and L5-S1. Pt is now s/p decompressive lumbar laminectomies L1-2, L4-S1; TLIF L1-L2.   OT comments  Progressing well with acute OT goals.  Demonstrating safe toileting and shower stall transfer.  Has purchased and can use A/E for lb dressing.  Husband present for session and was active in participation of session.  Needs 3n1 for home use.  Ready for d/c from OT.  Will alert OTR/L for d/c.    Follow Up Recommendations  No OT follow up;Supervision/Assistance - 24 hour    Equipment Recommendations  3 in 1 bedside comode    Recommendations for Other Services      Precautions / Restrictions Precautions Precautions: Fall;Back Precaution Comments: reviewed precautions, pt. able to state and demonstrate 3/3 during functional tasks Required Braces or Orthoses: Spinal Brace Spinal Brace: Lumbar corset;Applied in sitting position       Mobility Bed Mobility               General bed mobility comments: up in chair upon arrival, requests more practice with bed mobility with in/out from left side  Transfers Overall transfer level: Needs assistance Equipment used: Rolling walker (2 wheeled) Transfers: Sit to/from Stand Sit to Stand: Supervision         General transfer comment: cues for hand placement    Balance     Sitting balance-Leahy Scale: Good       Standing balance-Leahy Scale: Fair                     ADL Overall ADL's : Needs assistance/impaired     Grooming: Standing;Set up;Supervision/safety;Wash/dry hands          Lower Body Bathing Details (indicate cue type and reason): demonstrated use of LH sponge, husband purchased a/e kit from gift shop Upper Body Dressing : Set up;Supervision/safety;Sitting Upper Body Dressing Details (indicate cue type and reason): able to don brace with set up Lower Body Dressing: Set up;Supervision/safety;Sit to/from stand;With adaptive equipment Lower Body Dressing Details (indicate cue type and reason): don/doff socks and undergarments with use of reacher and sock aide, Toilet Transfer: Supervision/safety;RW;Comfort height toilet;BSC;Ambulation   Toileting- Clothing Manipulation and Hygiene: Supervision/safety;Sit to/from stand;Adhering to back precautions   Tub/ Shower Transfer: Walk-in shower;Ambulation;Anterior/posterior;Rolling walker;Grab bars;Shower seat;Min Chief Executive Officer Details (indicate cue type and reason): husband present for session reports shower is large with 2 shower heads, built in shower seat, grab bars, small ledge. pt. and spouse able to return demo of safe shower stall transfer and states he will be with her for bathing Functional mobility during ADLs: Min guard;Rolling walker General ADL Comments: safe return demo of shower transfer, all aspects of toileting, educated on uses of 3n1, pt. prefers 3n1 over the commode to offer b ue support during sit/stand.  able to return demo of use of a/e.  ready for d/c from ot      [place] During Therapy: [facility]  for tasks assessed/performed Overall Cognitive Status: Within Functional Limits for tasks assessed                       Extremity/Trunk Assessment               Exercises     Shoulder Instructions       General Comments      Pertinent Vitals/ Pain       Pain Assessment: No/denies pain  Home Living                                          Prior Functioning/Environment               Frequency Min 2X/week     Progress Toward Goals  OT Goals(current goals can now be found in the care plan section)  Progress towards OT goals: Progressing toward goals     Plan Discharge plan remains appropriate    Co-evaluation                 End of Session Equipment Utilized During Treatment: Gait belt;Rolling walker;Back brace   Activity Tolerance Patient tolerated treatment well   Patient Left in chair;with call bell/phone within reach;with family/visitor present   Nurse Communication Mobility status;Other (comment) (discussed family assist to b.room rn states no states only staff can assist)        Time: 0349-6116 OT Time Calculation (min): 27 min  Charges: OT General Charges $OT Visit: 1 Procedure OT Treatments $Self Care/Home Management : 23-37 mins  Janice Coffin, COTA/L 09/01/2014, 7:44 AM   Agree  With note [facility], OTR/L  [phone] 09/01/2014

## 2014-09-01 NOTE — Progress Notes (Signed)
Physical Therapy Treatment Patient Details Name: Sara Cross MRN: [id number] DOB: [birth year] Today's Date: 09/01/2014    History of Present Illness Patient is a very pleasant 59 year old female who had previous L2-L4 fusion who initially did very well however velocities months to progress worsening back pain and bilateral leg pain workup revealed progressive degenerative scoliosis collapse and deterioration at L1-L2 as well as at L4-L5 and L5-S1. Pt is now s/p decompressive lumbar laminectomies L1-2, L4-S1; TLIF L1-L2.    PT Comments    PT. Limited by pain but she realizes that more walking will be beneficial to loosen up muscles and fire nerves.    Pt. Did well on stairs and husband was present and educated on the best way to help her upon d/c to home. Pt. Would benefit from more ambulation and bed mobility for practice.  Follow Up Recommendations  Outpatient PT     Equipment Recommendations  Rolling walker with 5" wheels;3in1 (PT)    Recommendations for Other Services       Precautions / Restrictions Precautions Precautions: Fall;Back Precaution Booklet Issued: Yes (comment) Precaution Comments: reviewed precautions, pt. able to state and demonstrate 3/3 during functional tasks Required Braces or Orthoses: Spinal Brace Spinal Brace: Lumbar corset;Applied in sitting position Restrictions Weight Bearing Restrictions: No    Mobility  Bed Mobility Overal bed mobility: Needs Assistance Bed Mobility: Rolling;Sidelying to Sit;Sit to Sidelying Rolling: Supervision       Sit to sidelying: Min assist General bed mobility comments: min A for LEs getting into bed  Transfers Overall transfer level: Needs assistance Equipment used: Rolling walker (2 wheeled) Transfers: Sit to/from Stand Sit to Stand: Supervision         General transfer comment: cues for hand placement  Ambulation/Gait Ambulation/Gait assistance: Supervision Ambulation Distance (Feet): 90 Feet Assistive  device: Rolling walker (2 wheeled) Gait Pattern/deviations: Step-through pattern;Decreased stride length Gait velocity: Decreased Gait velocity interpretation: Below normal speed for age/gender General Gait Details: slow & guarded due to pain.    Stairs Stairs: Yes Stairs assistance: Min guard Stair Management: Sideways;One rail Right Number of Stairs: 3 General stair comments: pt. tried going forward did not make it, tried sideways. still painful but more effective.  Wheelchair Mobility    Modified Rankin (Stroke Patients Only)       Balance     Sitting balance-Leahy Scale: Good       Standing balance-Leahy Scale: Fair                      Cognition Arousal/Alertness: Awake/alert Behavior During Therapy: WFL for tasks assessed/performed Overall Cognitive Status: Within Functional Limits for tasks assessed                      Exercises      General Comments        Pertinent Vitals/Pain Pain Assessment: 0-10 Pain Score: 8  Pain Location: back Pain Descriptors / Indicators: Sore Pain Intervention(s): Monitored during session;Repositioned;Patient requesting pain meds-RN notified    Home Living                      Prior Function            PT Goals (current goals can now be found in the care plan section) Progress towards PT goals: Progressing toward goals    Frequency  Min 5X/week    PT Plan Current plan remains appropriate    Co-evaluation  End of Session Equipment Utilized During Treatment: Back brace Activity Tolerance: Patient limited by pain Patient left: in bed;with call bell/phone within reach;with bed alarm set;with family/visitor present     Time: 7670-1100 PT Time Calculation (min) (ACUTE ONLY): 25 min  Charges:                       G Codes:      Jodi Geralds, SPTA 09/01/2014, 9:16 AM

## 2014-09-01 NOTE — Progress Notes (Signed)
Patient ID: Sara Cross, female   DOB: [birth year], 59 y.o.   MRN: [id number] Ambulating  Minimal drainage. Dc in am?

## 2014-09-01 NOTE — Care Management Note (Signed)
    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    1/15- Transpedicular discectomy and decompression at T7-T8 with foraminotomies of the right T7 and T8 nerve roots.     Action/Plan:   [place] Needs - to be determined.   Anticipated DC Date:  09/01/2014   Anticipated DC Plan:  [place]  CM consult      [facility] Choice  DURABLE MEDICAL EQUIPMENT   Choice offered to / List presented to:  [name]      DME agency  [place].        Status of service:   Medicare Important Message given?  YES (If response is "NO", the following Medicare IM given date fields will be blank) Date Medicare IM given:  08/30/2014 Medicare IM given by:  Dicie Beam Date Additional Medicare IM given:  09/01/2014 Additional Medicare IM given by:  Lorne Skeens  Discharge Disposition:  HOME/SELF CARE  Per UR Regulation:  Reviewed for med. necessity/level of care/duration of stay  If discussed at [place] of Stay Meetings, dates discussed:    Comments:  09/02/14 [place], MSN, CM-Received call from bedside RN stating that no home health therapy or outpatient therapy is to be arranged at discharge, per Dr Saintclair Halsted.  Jeannetta Nap with [facility] was notified that patient will not require their services.  09/02/14 [place], MSN, CM- Met with patient to discuss discharge needs.  Current therapy recommendations are outpatient, no home health. RN to speak with Dr Saintclair Halsted regarding therapy needs when calling lab results.  CM will make necessary arrangements at that time. [place] [facility] was notified of need for RW and 3N1 for probable discharge home later today.  Patient is in agreement with  plan.   09/01/14 [place], MSN, CM- Additional Medicare IM letter provided.    08/30/14  1430p  Julianne Handler, RNBSN  [phone] HHC referral noted.  Spoke w/ pt at bedside in room 4N13C as PT was finishing therapy.  Verified home address and phone number as correct on face sheet.  Pt prefers to be contacted on her cell number [phone].  Discussed HHC and agencies, choice offered, no preference noted.  Pt will likely need home PT/OT and DME at dc.  Referral made to [facility] and she is aware that there are no specific orders yet.  CM will contiue to follow and assist as needed.

## 2014-09-01 NOTE — Progress Notes (Signed)
RT Note:  CPAP set up at bedside, auto titrate settings, with patients home mask and tubing with 2 lpm O2 bleed in. Distilled H20 added to chamber for humidity.  Patient states she is able to placed herself on/off as needed. Patient encouraged to call if assistance needed.

## 2014-09-01 NOTE — Progress Notes (Signed)
UR complete.  Lafayette Dunlevy RN, MSN 

## 2014-09-02 LAB — CBC WITH DIFFERENTIAL/PLATELET
BASOS PCT: 0 % (ref 0–1)
Basophils Absolute: 0 10*3/uL (ref 0.0–0.1)
Eosinophils Absolute: 0.2 10*3/uL (ref 0.0–0.7)
Eosinophils Relative: 2 % (ref 0–5)
HEMATOCRIT: 25.7 % — AB (ref 36.0–46.0)
Hemoglobin: 8.5 g/dL — ABNORMAL LOW (ref 12.0–15.0)
Lymphocytes Relative: 20 % (ref 12–46)
Lymphs Abs: 1.6 10*3/uL (ref 0.7–4.0)
MCH: 29.8 pg (ref 26.0–34.0)
MCHC: 33.1 g/dL (ref 30.0–36.0)
MCV: 90.2 fL (ref 78.0–100.0)
Monocytes Absolute: 0.7 10*3/uL (ref 0.1–1.0)
Monocytes Relative: 8 % (ref 3–12)
Neutro Abs: 5.8 10*3/uL (ref 1.7–7.7)
Neutrophils Relative %: 70 % (ref 43–77)
PLATELETS: 238 10*3/uL (ref 150–400)
RBC: 2.85 MIL/uL — ABNORMAL LOW (ref 3.87–5.11)
RDW: 13.7 % (ref 11.5–15.5)
WBC: 8.3 10*3/uL (ref 4.0–10.5)

## 2014-09-02 MED ORDER — OXYCODONE HCL 5 MG PO TABS: 15.0000 mg | ORAL_TABLET | ORAL | Status: DC | PRN

## 2014-09-02 NOTE — Progress Notes (Signed)
Patient ID: Sara Cross, female   DOB: 01-Apr-1956, 59 y.o.   MRN: [id number] Patient doing well no leg pain no chest wall pain  Strength 5 out of 5 wound clean dry and intact  D/C later today if cleared by physical therapy.

## 2014-09-02 NOTE — Progress Notes (Signed)
Pt discharging at this time with her husband. Alert, verbal with no complaints voiced. Pt took all personal belongings with her home including brace, bedside commode, and rolling walker. Discharge instructions provided with prescription for pain. Pt aware of followup appts with verbal understanding.

## 2014-09-02 NOTE — Discharge Summary (Signed)
Physician Discharge Summary  Patient ID: LENNA HAGARTY MRN: [id number] DOB/AGE: 11/01/1955 59 y.o.  Admit date: 08/29/2014 Discharge date: 09/02/2014  Admission Diagnoses: Thoracic lumbar spondylosis with stenosis  Discharge Diagnoses: Same Active Problems:   Spinal stenosis of lumbar region   Discharged Condition: good  Hospital Course: Patient was admitted hospital underwent a decompression stabilization procedure from L1-S1 as well as a thoracic transpedicular discectomy. Postoperative patient and has done fairly well significant improvement in lower extremity pain incisional pain is coming under control she is working with physical therapy she is potentially going home today. Her hematocrit did drop from 29-26 postop on the 16th we'll recheck it to make sure it stable and if it is stable we'll discharge the patient home.  Consults: Significant Diagnostic Studies:** Treatments: Transpedicular discectomy T7-T8 and lumbar decompression stabilization procedure L1 2 and L4-S1. Discharge Exam: Blood pressure 110/66, pulse 102, temperature 98.8 F (37.1 C), temperature source Oral, resp. rate 18, height 5\' 2"  (1.575 m), weight 90.719 kg (200 lb), SpO2 97 %. Strength 5 out of 5 wound clean dry and intact  Disposition: Home  Discharge Instructions    DME Bedside commode    Complete by:  As directed      For home use only DME 4 wheeled rolling walker with seat    Complete by:  As directed             Medication List    TAKE these medications        acetaminophen 500 MG tablet  Commonly known as:  TYLENOL  Take 1,000 mg by mouth every 6 (six) hours as needed.     ALPRAZolam 1 MG tablet  Commonly known as:  XANAX  TAKE ONE TABLET BY MOUTH TWICE DAILY     amphetamine-dextroamphetamine 20 MG tablet  Commonly known as:  ADDERALL  Take 1/2 to 1 tablet 2 or 3 x day if needed for ADD     buPROPion 300 MG 24 hr tablet  Commonly known as:  WELLBUTRIN XL  Take 1 tablet (300 mg  total) by mouth daily.     cyclobenzaprine 10 MG tablet  Commonly known as:  FLEXERIL  Take 1 tablet (10 mg total) by mouth at bedtime.     Fluticasone Furoate-Vilanterol 100-25 MCG/INH Aepb  Commonly known as:  BREO ELLIPTA  1 puff daily for breathing     furosemide 20 MG tablet  Commonly known as:  LASIX  TAKE TWO TABLETS BY MOUTH ONCE DAILY     ibuprofen 200 MG tablet  Commonly known as:  ADVIL,MOTRIN  Take 800 mg by mouth every 4 (four) hours as needed.     lisinopril 20 MG tablet  Commonly known as:  PRINIVIL,ZESTRIL  Take 20 mg by mouth daily.     lisinopril 40 MG tablet  Commonly known as:  PRINIVIL,ZESTRIL  TAKE ONE TABLET BY MOUTH ONCE DAILY     MAGNESIUM PO  Take 400 mg by mouth daily.     metFORMIN 500 MG 24 hr tablet  Commonly known as:  GLUCOPHAGE-XR  TAKE TWO TABLETS BY MOUTH TWICE DAILY     omeprazole 20 MG capsule  Commonly known as:  PRILOSEC  Take 1 capsule (20 mg total) by mouth daily.     oxyCODONE 5 MG immediate release tablet  Commonly known as:  Oxy IR/ROXICODONE  Take 3 tablets (15 mg total) by mouth every 4 (four) hours as needed for moderate pain or severe pain.  oxyCODONE-acetaminophen 5-325 MG per tablet  Commonly known as:  PERCOCET/ROXICET  Take 1 tablet by mouth every 6 (six) hours as needed for pain.     SUPER B COMPLEX PO  Take 1 tablet by mouth daily.     VITAMIN A EX  Apply topically daily.     VITAMIN D PO  Take 1 capsule by mouth daily.     vitamin E 400 UNIT capsule  Generic drug:  vitamin E  Take 800 Units by mouth daily.         Signed: Eria Lozoya P 09/02/2014, 7:34 AM

## 2014-09-02 NOTE — Discharge Instructions (Signed)
No lifting no bending no twisting no driving a riding a car unless she has come back and forth to see me. °

## 2014-09-02 NOTE — Progress Notes (Signed)
Physical Therapy Treatment Patient Details Name: Sara Cross MRN: [id number] DOB: 13-Feb-1956 Today's Date: 09/02/2014    History of Present Illness Patient is a very pleasant 59 year old female who had previous L2-L4 fusion who initially did very well however velocities months to progress worsening back pain and bilateral leg pain workup revealed progressive degenerative scoliosis collapse and deterioration at L1-L2 as well as at L4-L5 and L5-S1. Pt is now s/p decompressive lumbar laminectomies L1-2, L4-S1; TLIF L1-L2.    PT Comments    Pt. Being d'cd today to home and has made sufficient progress to be safe at home with the familial support that will be there.. Pt. Walked an increased distance well, able to manipulate a similar height of stairs to home environment and also those that pt. might encounter in community. Pt. Will benefit form more ambulation and strengthening upon d/c.  Follow Up Recommendations  Outpatient PT     Equipment Recommendations  Rolling walker with 5" wheels;3in1 (PT)    Recommendations for Other Services       Precautions / Restrictions Precautions Precautions: Fall;Back Precaution Booklet Issued: Yes (comment) Precaution Comments: reviewed precautions, pt. able to state and demonstrate 3/3 during functional tasks Required Braces or Orthoses: Spinal Brace Spinal Brace: Lumbar corset;Applied in sitting position Restrictions Weight Bearing Restrictions: No    Mobility  Bed Mobility Overal bed mobility: Needs Assistance Bed Mobility: Rolling;Sidelying to Sit;Sit to Sidelying Rolling: Supervision Sidelying to sit: Min assist       General bed mobility comments: min A for LEs to get around to sitting up straight  Transfers Overall transfer level: Needs assistance Equipment used: Rolling walker (2 wheeled) Transfers: Sit to/from Stand Sit to Stand: Supervision         General transfer comment: cues for hand  placement  Ambulation/Gait Ambulation/Gait assistance: Supervision Ambulation Distance (Feet): 300 Feet Assistive device: Rolling walker (2 wheeled) Gait Pattern/deviations: Step-through pattern;Decreased stride length Gait velocity: Decreased Gait velocity interpretation: Below normal speed for age/gender General Gait Details: slow & guarded due to pain.    Stairs Stairs: Yes Stairs assistance: Min guard Stair Management: One rail Right;Sideways Number of Stairs: 6 General stair comments: Went sideways in creased pain but completed going up and down and increased and decreased step height.  Wheelchair Mobility    Modified Rankin (Stroke Patients Only)       Balance Overall balance assessment: No apparent balance deficits (not formally assessed) Sitting-balance support: No upper extremity supported;Feet supported Sitting balance-Leahy Scale: Good     Standing balance support: Bilateral upper extremity supported;During functional activity Standing balance-Leahy Scale: Fair Standing balance comment: Requires UE support to maintain balance at this time                    Cognition Arousal/Alertness: Awake/alert Behavior During Therapy: WFL for tasks assessed/performed Overall Cognitive Status: Within Functional Limits for tasks assessed                      Exercises      General Comments        Pertinent Vitals/Pain Pain Score: 7  Pain Location: back Pain Descriptors / Indicators: Dull Pain Intervention(s): Monitored during session;Repositioned    Home Living                      Prior Function            PT Goals (current goals can now be found in the care  plan section) Progress towards PT goals: Progressing toward goals    Frequency  Min 5X/week    PT Plan Current plan remains appropriate    Co-evaluation             End of Session Equipment Utilized During Treatment: Back brace Activity Tolerance: Patient  tolerated treatment well Patient left: in chair;with call bell/phone within reach     Time: 0900-0930 PT Time Calculation (min) (ACUTE ONLY): 30 min  Charges:                       G Codes:      Sara Cross, SPTA 09/02/2014, 9:55 AM

## 2014-09-11 ENCOUNTER — Ambulatory Visit: Payer: Self-pay | Admitting: Internal Medicine

## 2014-09-30 ENCOUNTER — Other Ambulatory Visit: Payer: Self-pay | Admitting: Physician Assistant

## 2014-10-15 ENCOUNTER — Encounter: Payer: Self-pay | Admitting: Physical Therapy

## 2014-10-15 ENCOUNTER — Ambulatory Visit: Payer: Medicare Other | Attending: Neurosurgery | Admitting: Physical Therapy

## 2014-10-15 DIAGNOSIS — R262 Difficulty in walking, not elsewhere classified: Secondary | ICD-10-CM | POA: Diagnosis not present

## 2014-10-15 DIAGNOSIS — M545 Low back pain, unspecified: Secondary | ICD-10-CM

## 2014-10-15 NOTE — Therapy (Signed)
[place], [place], [address] Phone: [phone]   Fax:  [phone]  Physical Therapy Evaluation  Patient Details  Name: Sara Cross MRN: [id number] Date of Birth: 1955/12/05 Referring Provider:  Elaina Hoops, MD  Encounter Date: 10/15/2014      PT End of Session - 10/15/14 1138    Visit Number 1   PT Start Time 9470   PT Stop Time 1153   PT Time Calculation (min) 64 min   Activity Tolerance Patient limited by pain      Past Medical History  Diagnosis Date  . Depression   . Pneumonia     hx of  . Arthritis   . Morbid obesity   . Anxiety   . Sleep apnea     wear cpap nightly  . GERD (gastroesophageal reflux disease)   . Headache(784.0)   . Dizziness   . DDD (degenerative disc disease)   . Hypertension   . Hyperlipidemia   . Diabetes mellitus     cbg fasting 170-200    Past Surgical History  Procedure Laterality Date  . Back surgery      01/2011  . Hand surgery      right   2011    FOR INJURY   . Cesearean      X2  . Toenail excision      BIG TOE RIGHT FOOT  . Toe nail removal      right great toe  . Abdominal hysterectomy  2004  . Colonoscopy    . Anterior cervical decomp/discectomy fusion  12/23/2011    Procedure: ANTERIOR CERVICAL DECOMPRESSION/DISCECTOMY FUSION 2 LEVELS;  Surgeon: Elaina Hoops, MD;  Location: [facility];  Service: Neurosurgery;  Laterality: N/A;  Anterior Cervical Four-Five/Five-Six Decompression and Fusion  . Carpal tunnel release  12/23/2011    Procedure: CARPAL TUNNEL RELEASE;  Surgeon: Elaina Hoops, MD;  Location: [facility];  Service: Neurosurgery;  Laterality: Left;  Left Carpal Tunnel Release  . Anterior cervical decomp/discectomy fusion N/A 06/07/2013    Procedure: ANTERIOR CERVICAL DECOMPRESSION/DISCECTOMY FUSION 1 LEVEL/HARDWARE REMOVAL;  Surgeon: Elaina Hoops, MD;  Location: [facility];  Service: Neurosurgery;  Laterality: N/A;  ANTERIOR  CERVICAL DECOMPRESSION/DISCECTOMY FUSION 1 LEVEL/HARDWARE REMOVAL  . Lumbar laminectomy/decompression microdiscectomy N/A 08/29/2014    Procedure: Thoracic seven-eight  microdiskectomy;  Surgeon: Elaina Hoops, MD;  Location: [facility];  Service: Neurosurgery;  Laterality: N/A;    There were no vitals taken for this visit.  Visit Diagnosis:  Midline low back pain without sciatica - Plan: PT plan of care cert/re-cert  Difficulty walking - Plan: PT plan of care cert/re-cert      Subjective Assessment - 10/15/14 1058    Symptoms Had lumbar and thoracic surgery 08/29/14.  Has low back paina dn difficulty with all motions and ADL's   Pertinent History had lumbar surgery in 2012, ACDF x 2 with most recent being in 2014   Limitations Sitting;Lifting;Standing;Walking;House hold activities   How long can you sit comfortably? 20 minutes   How long can you stand comfortably? 25 minutes   How long can you walk comfortably? 10-15 minutes   Patient Stated Goals do ADL's without pain and with ease, put shoes and socks on   Currently in Pain? Yes   Pain Score 7    Pain Location Back   Pain Orientation Mid;Lower   Pain Descriptors / Indicators Aching;Sharp;Spasm;Tightness;Throbbing  Pain Radiating Towards into the buttocks   Pain Onset More than a month ago   Pain Frequency Constant   Aggravating Factors  standing and walking   Pain Relieving Factors walking   Effect of Pain on Daily Activities limited with everything          [facility] PT Assessment - 10/15/14 0001    Assessment   Medical Diagnosis S/P lumbar fusion 3 levels and thoracic microdiskectomy   Onset Date 08/29/14   Next MD Visit m+1   Precautions   Precautions --  limit bending twisting and lifting   Balance Screen   Has the patient fallen in the past 6 months No   Has the patient had a decrease in activity level because of a fear of falling?  No   Is the patient reluctant to leave their home because of a fear of falling?  No    Home Environment   Additional Comments she would like to return to doing her own housework   Prior Function   Leisure would like to be able to walk   Functional Tests   Functional tests --  reports that she has to have help dressing for shoes and soc   Posture/Postural Control   Posture Comments fwd head, rounded shoulders, has lumbar brace on   AROM   Overall AROM  Deficits;Due to pain   Overall AROM Comments Lumbar ROM decreased 100% with pain   Strength   Overall Strength Comments LE strength is 4-/5 with some pain    Flexibility   Soft Tissue Assessment /Muscle Length --  very tight HS, calf and piriformis mms   Palpation   Palpation scar is healing, tender and sensitive, has tightness in the lumbar area and into the buttocks   Ambulation/Gait   Gait Comments gait is with a SPC since the surgery, antalgic on the left, very slow                          PT Education - 10/15/14 1134    Education provided Yes   Education Details scap stabilization in sitting, sitting and marching and LAQ's and piriformis stretches   Person(s) Educated Patient   Methods Explanation;Demonstration;Handout   Comprehension Verbalized understanding          PT Short Term Goals - 10/15/14 1142    PT SHORT TERM GOAL #1   Title independent with initial HEP   Time 2   Period Weeks   Status New           PT Long Term Goals - 10/15/14 1143    PT LONG TERM GOAL #1   Title independent with proper posture and body mechanics for ADL's   Time 8   Period Weeks   Status New   PT LONG TERM GOAL #2   Title increase lumbar ROM 25%   Time 8   Period Weeks   Status New   PT LONG TERM GOAL #3   Title put on shoes and socks without assistance   Time 8   Period Weeks   Status New   PT LONG TERM GOAL #4   Title decrease pain 25%   Time 8   Period Weeks   Status New               Plan - 10/15/14 1141    Clinical Impression Statement Patient with 3 level lumbar  fusion and a thoracic microdiskectomy, she has significant  decreased ROM, and limitation in her movments and reports that she currently is having to have help put on her shoes and socks   Pt will benefit from skilled therapeutic intervention in order to improve on the following deficits Decreased strength;Decreased endurance;Decreased range of motion;Difficulty walking;Impaired flexibility;Improper body mechanics;Pain   Rehab Potential Good   PT Frequency 2x / week   PT Duration 8 weeks   PT Treatment/Interventions Electrical Stimulation;Ultrasound;Gait training;Moist Heat;Contrast Bath;Therapeutic activities;Therapeutic exercise;Patient/family education;Passive range of motion;Manual techniques   PT Next Visit Plan slowly add exercises and stretches   Consulted and Agree with Plan of Care Patient          G-Codes - 11/11/14 1149    Functional Assessment Tool Used FOTO   Functional Limitation Other PT primary   Other PT Primary Current Status (M4680) At least 60 percent but less than 80 percent impaired, limited or restricted   Other PT Primary Goal Status (H2122) At least 40 percent but less than 60 percent impaired, limited or restricted       Problem List Patient Active Problem List   Diagnosis Date Noted  . Spinal stenosis of lumbar region 08/29/2014  . ADD 04/02/2014  . Diabetes mellitus type 2 with neurological manifestations 02/26/2014  . Vitamin D deficiency 02/26/2014  . Encounter for long-term (current) use of medications 02/26/2014  . Hyperlipidemia 11/26/2013  . Palpitations 11/26/2013  . Diabetic neuropathy 11/01/2013  . DDD (degenerative disc disease)   . Depression   . Anxiety   . OSA/CPAP   . GERD (gastroesophageal reflux disease)   . FIBROIDS, UTERUS 02/17/2009  . Essential hypertension 02/17/2009    Sumner Boast, PT 11-11-14, 11:50 AM  [place] [address] [place], [place],  [address] Phone: [phone]   Fax:  [phone]

## 2014-10-22 ENCOUNTER — Ambulatory Visit: Payer: Medicare Other | Admitting: Physical Therapy

## 2014-10-22 ENCOUNTER — Encounter: Payer: Self-pay | Admitting: Physical Therapy

## 2014-10-22 DIAGNOSIS — M545 Low back pain, unspecified: Secondary | ICD-10-CM

## 2014-10-22 DIAGNOSIS — R262 Difficulty in walking, not elsewhere classified: Secondary | ICD-10-CM

## 2014-10-22 NOTE — Therapy (Signed)
[place], [place], [address] Phone: [phone]   Fax:  [phone]  Physical Therapy Treatment  Patient Details  Name: Sara Cross MRN: [id number] Date of Birth: [birth year] Referring Provider:  Kary Kos, MD  Encounter Date: 10/22/2014      PT End of Session - 10/22/14 1456    Visit Number 2   Number of Visits 16   Date for PT Re-Evaluation 12/15/14   PT Start Time 7591   PT Stop Time 1507   PT Time Calculation (min) 49 min   Activity Tolerance Patient limited by pain      Past Medical History  Diagnosis Date  . Depression   . Pneumonia     hx of  . Arthritis   . Morbid obesity   . Anxiety   . Sleep apnea     wear cpap nightly  . GERD (gastroesophageal reflux disease)   . Headache(784.0)   . Dizziness   . DDD (degenerative disc disease)   . Hypertension   . Hyperlipidemia   . Diabetes mellitus     cbg fasting 170-200    Past Surgical History  Procedure Laterality Date  . Back surgery      01/2011  . Hand surgery      right   2011    FOR INJURY   . Cesearean      X2  . Toenail excision      BIG TOE RIGHT FOOT  . Toe nail removal      right great toe  . Abdominal hysterectomy  2004  . Colonoscopy    . Anterior cervical decomp/discectomy fusion  12/23/2011    Procedure: ANTERIOR CERVICAL DECOMPRESSION/DISCECTOMY FUSION 2 LEVELS;  Surgeon: Elaina Hoops, MD;  Location: [facility];  Service: Neurosurgery;  Laterality: N/A;  Anterior Cervical Four-Five/Five-Six Decompression and Fusion  . Carpal tunnel release  12/23/2011    Procedure: CARPAL TUNNEL RELEASE;  Surgeon: Elaina Hoops, MD;  Location: [facility];  Service: Neurosurgery;  Laterality: Left;  Left Carpal Tunnel Release  . Anterior cervical decomp/discectomy fusion N/A 06/07/2013    Procedure: ANTERIOR CERVICAL DECOMPRESSION/DISCECTOMY FUSION 1 LEVEL/HARDWARE REMOVAL;  Surgeon: Elaina Hoops, MD;  Location: [facility];  Service: Neurosurgery;  Laterality: N/A;  ANTERIOR CERVICAL DECOMPRESSION/DISCECTOMY FUSION 1 LEVEL/HARDWARE REMOVAL  . Lumbar laminectomy/decompression microdiscectomy N/A 08/29/2014    Procedure: Thoracic seven-eight  microdiskectomy;  Surgeon: Elaina Hoops, MD;  Location: [facility];  Service: Neurosurgery;  Laterality: N/A;    There were no vitals taken for this visit.  Visit Diagnosis:  Midline low back pain without sciatica  Difficulty walking      Subjective Assessment - 10/22/14 1419    Symptoms Doing ok.   Currently in Pain? Yes   Pain Score 5    Pain Onset More than a month ago                    [facility] Adult PT Treatment/Exercise - 10/22/14 0001    Exercises   Exercises Lumbar   Lumbar Exercises: Aerobic   Stationary Bike 6 minutes  Nustep level 4   Lumbar Exercises: Seated   Long Arc Quad on Chair 2 sets;15 reps   LAQ on Chair Weights (lbs) 3   Other Seated Lumbar Exercises seated row  unsupported red theraband 2x10   Lumbar Exercises: Supine   Bridge 10 reps  2 sets  Straight Leg Raise 10 reps  2 sets   Other Supine Lumbar Exercises KTC, rotation with ball  2x15   Modalities   Modalities Electrical Stimulation;Moist Heat   Moist Heat Therapy   Number Minutes Moist Heat 15 Minutes   Moist Heat Location Other (comment)  thoracolumbar   Electrical Stimulation   Electrical Stimulation Location lower thoracic/lumbar-sacral   Electrical Stimulation Action premod   Electrical Stimulation Goals Pain                  PT Short Term Goals - 10/22/14 1453    PT SHORT TERM GOAL #1   Title independent with initial HEP   Time 2   Period Weeks   Status Achieved           PT Long Term Goals - 10/22/14 1453    PT LONG TERM GOAL #1   Title independent with proper posture and body mechanics for ADL's   Time 8   Period Weeks   Status Achieved   PT LONG TERM GOAL #2   Title increase lumbar ROM 25%   Time 8   Period Weeks    Status On-going   PT LONG TERM GOAL #3   Title put on shoes and socks without assistance   Time 8   Period Weeks   Status On-going   PT LONG TERM GOAL #4   Title decrease pain 25%   Baseline 7/10   Time 8   Period Weeks   Status On-going               Plan - 10/22/14 1454    Clinical Impression Statement Able to sit without brace and unsupported on mat table while performing rows with red theraband.  No adverse effects from treatment. Decreased stance time on LLE due to weakness.   Pt will benefit from skilled therapeutic intervention in order to improve on the following deficits Decreased strength;Decreased endurance;Decreased range of motion;Difficulty walking;Impaired flexibility;Improper body mechanics;Pain   Rehab Potential Good   PT Frequency 2x / week   PT Duration 8 weeks   PT Treatment/Interventions Electrical Stimulation;Ultrasound;Gait training;Moist Heat;Contrast Bath;Therapeutic activities;Therapeutic exercise;Patient/family education;Passive range of motion;Manual techniques   PT Next Visit Plan Continue to strengthen core and lower extremities.   Consulted and Agree with Plan of Care Patient        Problem List Patient Active Problem List   Diagnosis Date Noted  . Spinal stenosis of lumbar region 08/29/2014  . ADD 04/02/2014  . Diabetes mellitus type 2 with neurological manifestations 02/26/2014  . Vitamin D deficiency 02/26/2014  . Encounter for long-term (current) use of medications 02/26/2014  . Hyperlipidemia 11/26/2013  . Palpitations 11/26/2013  . Diabetic neuropathy 11/01/2013  . DDD (degenerative disc disease)   . Depression   . Anxiety   . OSA/CPAP   . GERD (gastroesophageal reflux disease)   . FIBROIDS, UTERUS 02/17/2009  . Essential hypertension 02/17/2009    Nandika Stetzer PTA 10/22/2014, 2:59 PM  [place] [address] [place], [place], [address] Phone: [phone]    Fax:  [phone]

## 2014-10-27 ENCOUNTER — Encounter: Payer: Self-pay | Admitting: Emergency Medicine

## 2014-10-29 ENCOUNTER — Encounter: Payer: Self-pay | Admitting: Physical Therapy

## 2014-10-29 ENCOUNTER — Ambulatory Visit: Payer: Medicare Other | Admitting: Physical Therapy

## 2014-10-29 DIAGNOSIS — M545 Low back pain, unspecified: Secondary | ICD-10-CM

## 2014-10-29 DIAGNOSIS — R262 Difficulty in walking, not elsewhere classified: Secondary | ICD-10-CM

## 2014-10-29 NOTE — Therapy (Signed)
[place], [place], [address] Phone: [phone]   Fax:  [phone]  Physical Therapy Treatment  Patient Details  Name: Sara Cross MRN: [id number] Date of Birth: 05/18/1956 Referring Provider:  Kary Kos, MD  Encounter Date: 10/29/2014      PT End of Session - 10/29/14 1438    Visit Number 3   Number of Visits 16   Date for PT Re-Evaluation 12/15/14   PT Start Time 1884   PT Stop Time 1452   PT Time Calculation (min) 49 min   Activity Tolerance Patient limited by pain;Patient limited by fatigue   Behavior During Therapy [facility] for tasks assessed/performed      Past Medical History  Diagnosis Date  . Depression   . Pneumonia     hx of  . Arthritis   . Morbid obesity   . Anxiety   . Sleep apnea     wear cpap nightly  . GERD (gastroesophageal reflux disease)   . Headache(784.0)   . Dizziness   . DDD (degenerative disc disease)   . Hypertension   . Hyperlipidemia   . Diabetes mellitus     cbg fasting 170-200    Past Surgical History  Procedure Laterality Date  . Back surgery      01/2011  . Hand surgery      right   2011    FOR INJURY   . Cesearean      X2  . Toenail excision      BIG TOE RIGHT FOOT  . Toe nail removal      right great toe  . Abdominal hysterectomy  2004  . Colonoscopy    . Anterior cervical decomp/discectomy fusion  12/23/2011    Procedure: ANTERIOR CERVICAL DECOMPRESSION/DISCECTOMY FUSION 2 LEVELS;  Surgeon: Elaina Hoops, MD;  Location: [facility];  Service: Neurosurgery;  Laterality: N/A;  Anterior Cervical Four-Five/Five-Six Decompression and Fusion  . Carpal tunnel release  12/23/2011    Procedure: CARPAL TUNNEL RELEASE;  Surgeon: Elaina Hoops, MD;  Location: [facility];  Service: Neurosurgery;  Laterality: Left;  Left Carpal Tunnel Release  . Anterior cervical decomp/discectomy fusion N/A 06/07/2013    Procedure: ANTERIOR CERVICAL  DECOMPRESSION/DISCECTOMY FUSION 1 LEVEL/HARDWARE REMOVAL;  Surgeon: Elaina Hoops, MD;  Location: [facility];  Service: Neurosurgery;  Laterality: N/A;  ANTERIOR CERVICAL DECOMPRESSION/DISCECTOMY FUSION 1 LEVEL/HARDWARE REMOVAL  . Lumbar laminectomy/decompression microdiscectomy N/A 08/29/2014    Procedure: Thoracic seven-eight  microdiskectomy;  Surgeon: Elaina Hoops, MD;  Location: [facility];  Service: Neurosurgery;  Laterality: N/A;    There were no vitals filed for this visit.  Visit Diagnosis:  Midline low back pain without sciatica  Difficulty walking      Subjective Assessment - 10/29/14 1404    Symptoms I'm trying to slow down on taking the pain medicine.   Currently in Pain? Yes   Pain Score 6    Pain Location Back   Pain Orientation Lower   Pain Onset More than a month ago   Multiple Pain Sites No                       [facility] Adult PT Treatment/Exercise - 10/29/14 0001    Exercises   Exercises Lumbar   Lumbar Exercises: Stretches   Active Hamstring Stretch 60 seconds;2 reps  each LE   Lumbar Exercises: Aerobic   Stationary Bike 6  minutes  Nustep level 5   Lumbar Exercises: Standing   Other Standing Lumbar Exercises hip flexion  10 reps bilaterally   Lumbar Exercises: Supine   Bridge 10 reps  2 sets   Straight Leg Raise 10 reps  2 sets   Other Supine Lumbar Exercises KTC, rotation with ball  2x15   Modalities   Modalities Electrical Stimulation;Moist Heat   Moist Heat Therapy   Number Minutes Moist Heat 15 Minutes   Moist Heat Location Other (comment)  lumbar   Electrical Stimulation   Electrical Stimulation Location lower thoracic/lumbar-sacral   Electrical Stimulation Action IFC   Electrical Stimulation Goals Pain                  PT Short Term Goals - 10/22/14 1453    PT SHORT TERM GOAL #1   Title independent with initial HEP   Time 2   Period Weeks   Status Achieved           PT Long Term Goals - 10/22/14 1453     PT LONG TERM GOAL #1   Title independent with proper posture and body mechanics for ADL's   Time 8   Period Weeks   Status Achieved   PT LONG TERM GOAL #2   Title increase lumbar ROM 25%   Time 8   Period Weeks   Status On-going   PT LONG TERM GOAL #3   Title put on shoes and socks without assistance   Time 8   Period Weeks   Status On-going   PT LONG TERM GOAL #4   Title decrease pain 25%   Baseline 7/10   Time 8   Period Weeks   Status On-going               Plan - 10/29/14 1437    Clinical Impression Statement Very tight hamstrings.  Able to sit unsupported with single leg on stool for LE stretching without increase in symptoms.   Pt will benefit from skilled therapeutic intervention in order to improve on the following deficits Decreased strength;Decreased endurance;Decreased range of motion;Difficulty walking;Impaired flexibility;Improper body mechanics;Pain   Rehab Potential Good   PT Frequency 2x / week   PT Duration 8 weeks   PT Treatment/Interventions Electrical Stimulation;Ultrasound;Gait training;Moist Heat;Contrast Bath;Therapeutic activities;Therapeutic exercise;Patient/family education;Passive range of motion;Manual techniques   PT Next Visit Plan Continue to strengthen core and lower extremities.   Consulted and Agree with Plan of Care Patient        Problem List Patient Active Problem List   Diagnosis Date Noted  . Spinal stenosis of lumbar region 08/29/2014  . ADD 04/02/2014  . Diabetes mellitus type 2 with neurological manifestations 02/26/2014  . Vitamin D deficiency 02/26/2014  . Encounter for long-term (current) use of medications 02/26/2014  . Hyperlipidemia 11/26/2013  . Palpitations 11/26/2013  . Diabetic neuropathy 11/01/2013  . DDD (degenerative disc disease)   . Depression   . Anxiety   . OSA/CPAP   . GERD (gastroesophageal reflux disease)   . FIBROIDS, UTERUS 02/17/2009  . Essential hypertension 02/17/2009    Everlynn Sagun  PTA 10/29/2014, 2:39 PM  [place] [address] [place], [place], [address] Phone: [phone]   Fax:  [phone]

## 2014-11-05 ENCOUNTER — Ambulatory Visit: Payer: Medicare Other | Admitting: Physical Therapy

## 2014-11-05 ENCOUNTER — Encounter: Payer: Self-pay | Admitting: Physical Therapy

## 2014-11-05 DIAGNOSIS — M545 Low back pain, unspecified: Secondary | ICD-10-CM

## 2014-11-05 DIAGNOSIS — R262 Difficulty in walking, not elsewhere classified: Secondary | ICD-10-CM

## 2014-11-05 NOTE — Therapy (Signed)
[place], [place], [address] Phone: [phone]   Fax:  [phone]  Physical Therapy Treatment  Patient Details  Name: Sara Cross MRN: [id number] Date of Birth: [birth year] Referring Provider:  Kary Kos, MD  Encounter Date: 11/05/2014    Past Medical History  Diagnosis Date  . Depression   . Pneumonia     hx of  . Arthritis   . Morbid obesity   . Anxiety   . Sleep apnea     wear cpap nightly  . GERD (gastroesophageal reflux disease)   . Headache(784.0)   . Dizziness   . DDD (degenerative disc disease)   . Hypertension   . Hyperlipidemia   . Diabetes mellitus     cbg fasting 170-200    Past Surgical History  Procedure Laterality Date  . Back surgery      01/2011  . Hand surgery      right   2011    FOR INJURY   . Cesearean      X2  . Toenail excision      BIG TOE RIGHT FOOT  . Toe nail removal      right great toe  . Abdominal hysterectomy  2004  . Colonoscopy    . Anterior cervical decomp/discectomy fusion  12/23/2011    Procedure: ANTERIOR CERVICAL DECOMPRESSION/DISCECTOMY FUSION 2 LEVELS;  Surgeon: Elaina Hoops, MD;  Location: [place] [facility];  Service: Neurosurgery;  Laterality: N/A;  Anterior Cervical Four-Five/Five-Six Decompression and Fusion  . Carpal tunnel release  12/23/2011    Procedure: CARPAL TUNNEL RELEASE;  Surgeon: Elaina Hoops, MD;  Location: [place];  Service: Neurosurgery;  Laterality: Left;  Left Carpal Tunnel Release  . Anterior cervical decomp/discectomy fusion N/A 06/07/2013    Procedure: ANTERIOR CERVICAL DECOMPRESSION/DISCECTOMY FUSION 1 LEVEL/HARDWARE REMOVAL;  Surgeon: Elaina Hoops, MD;  Location: [facility];  Service: Neurosurgery;  Laterality: N/A;  ANTERIOR CERVICAL DECOMPRESSION/DISCECTOMY FUSION 1 LEVEL/HARDWARE REMOVAL  . Lumbar laminectomy/decompression microdiscectomy N/A 08/29/2014    Procedure: Thoracic seven-eight  microdiskectomy;   Surgeon: Elaina Hoops, MD;  Location: [facility] [facility];  Service: Neurosurgery;  Laterality: N/A;    There were no vitals filed for this visit.  Visit Diagnosis:  Midline low back pain without sciatica  Difficulty walking      Subjective Assessment - 11/05/14 1403    Symptoms Doing pretty good.  Still tough sleeping.   Currently in Pain? Yes   Pain Score 4             [facility] PT Assessment - 11/05/14 0001    ROM / Strength   AROM / PROM / Strength AROM   AROM   Overall AROM  Deficits;Due to pain   Overall AROM Comments Lumbar ROM decreased 100% with pain   AROM Assessment Site Lumbar                   [facility] Adult PT Treatment/Exercise - 11/05/14 0001    Exercises   Exercises Lumbar   Lumbar Exercises: Aerobic   Stationary Bike 6 minutes  Nustep level 5   Lumbar Exercises: Machines for Strengthening   Cybex Knee Extension 10# x15 then 5# x15   Cybex Knee Flexion 25# 2x15   Lumbar Exercises: Standing   Other Standing Lumbar Exercises hip flexion 3# 2x15   Lumbar Exercises: Supine   Bridge 10 reps  2 sets   Straight Leg Raise  10 reps  2 sets   Other Supine Lumbar Exercises KTC, rotation with ball  2x15   Modalities   Modalities Electrical Stimulation;Moist Heat   Moist Heat Therapy   Number Minutes Moist Heat 15 Minutes   Moist Heat Location Other (comment)  lumbar   Electrical Stimulation   Electrical Stimulation Location lower thoracic/lumbar-sacral   Electrical Stimulation Action IFC   Electrical Stimulation Goals Pain                  PT Short Term Goals - 10/22/14 1453    PT SHORT TERM GOAL #1   Title independent with initial HEP   Time 2   Period Weeks   Status Achieved           PT Long Term Goals - 10/22/14 1453    PT LONG TERM GOAL #1   Title independent with proper posture and body mechanics for ADL's   Time 8   Period Weeks   Status Achieved   PT LONG TERM GOAL #2   Title increase lumbar ROM 25%   Time 8   Period  Weeks   Status On-going   PT LONG TERM GOAL #3   Title put on shoes and socks without assistance   Time 8   Period Weeks   Status On-going   PT LONG TERM GOAL #4   Title decrease pain 25%   Baseline 7/10   Time 8   Period Weeks   Status On-going               Plan - 11/05/14 1457    Clinical Impression Statement Patient ambulated to therapy today without SPC.  Guarding continues, but strength is improving.  She complains of decreased strength in BLE's and easy fatigue.   Rehab Potential Good   PT Frequency 2x / week   PT Duration 8 weeks   PT Treatment/Interventions Electrical Stimulation;Ultrasound;Gait training;Moist Heat;Contrast Bath;Therapeutic activities;Therapeutic exercise;Patient/family education;Passive range of motion;Manual techniques   PT Next Visit Plan Continue to strengthen core and lower extremities.   Consulted and Agree with Plan of Care Patient        Problem List Patient Active Problem List   Diagnosis Date Noted  . Spinal stenosis of lumbar region 08/29/2014  . ADD 04/02/2014  . Diabetes mellitus type 2 with neurological manifestations 02/26/2014  . Vitamin D deficiency 02/26/2014  . Encounter for long-term (current) use of medications 02/26/2014  . Hyperlipidemia 11/26/2013  . Palpitations 11/26/2013  . Diabetic neuropathy 11/01/2013  . DDD (degenerative disc disease)   . Depression   . Anxiety   . OSA/CPAP   . GERD (gastroesophageal reflux disease)   . FIBROIDS, UTERUS 02/17/2009  . Essential hypertension 02/17/2009    Nada Boozer PTA 11/05/2014, 3:03 PM  [place] [address] [place], [place], [address] Phone: [phone]   Fax:  [phone]

## 2014-11-06 IMAGING — CT CT L SPINE W/O CM
4 of 10 series · 12 of 33 positions shown, 14 images · non-contrast
Comparison: MRI 05/20/2013.

CLINICAL DATA: Low back pain. Bilateral leg pain and numbness.
Previous lumbar fusion.

EXAM:
CT LUMBAR SPINE WITHOUT CONTRAST
TECHNIQUE: Multidetector CT imaging of the lumbar spine was performed without
intravenous contrast administration. Multiplanar CT image
reconstructions were also generated.

[Series 2: l spine bone · axial · 0.27mm/px · z∈[-56,+12]mm · 2 of 82 slices shown, 3 images]
[im 28/82  soft-tissue]
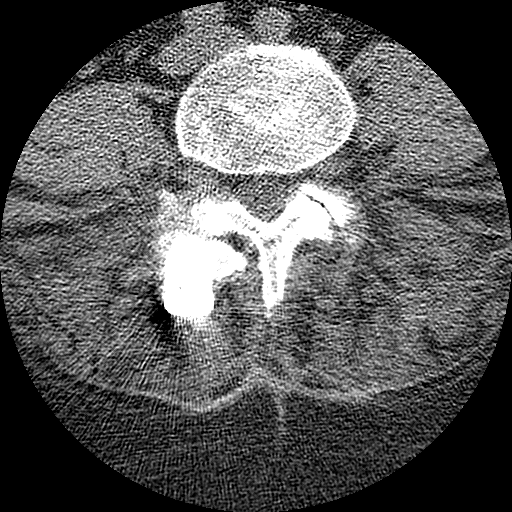
[im 28/82  bone]
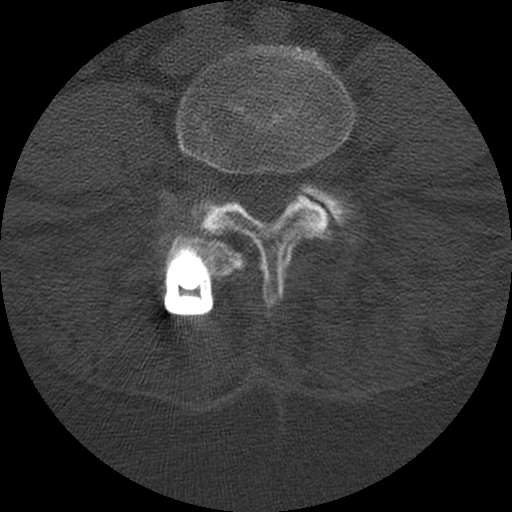
[im 55/82  bone]
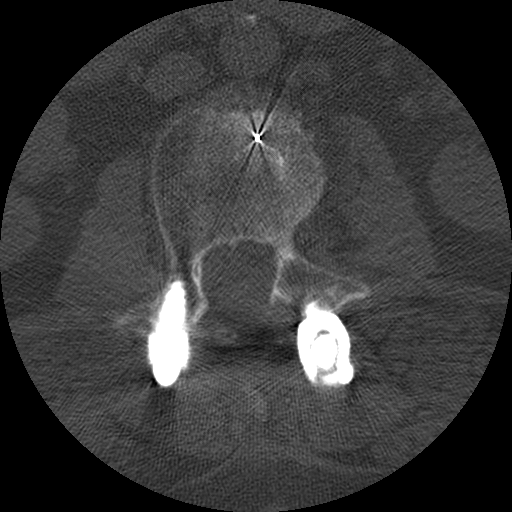

[Series 3: l spine soft · axial · 0.27mm/px · z∈[-56,+12]mm · 2 of 82 slices shown]
[im 28/82  soft-tissue]
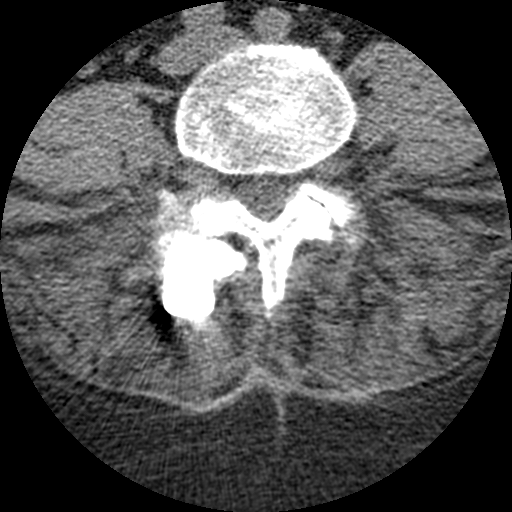
[im 55/82  soft-tissue]
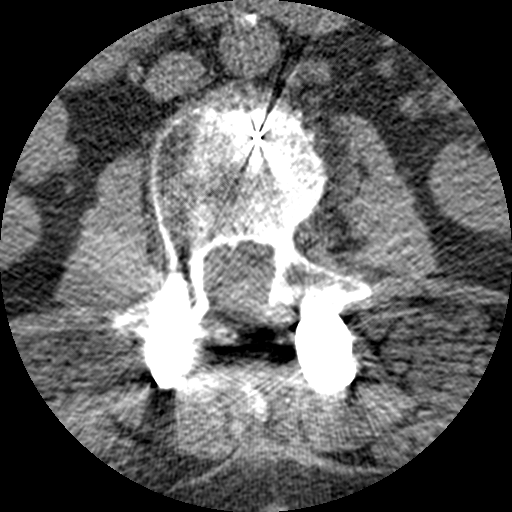

[Series 400: cor · coronal · 0.41mm/px · 3 of 50 slices shown]
[im 10/50  bone]
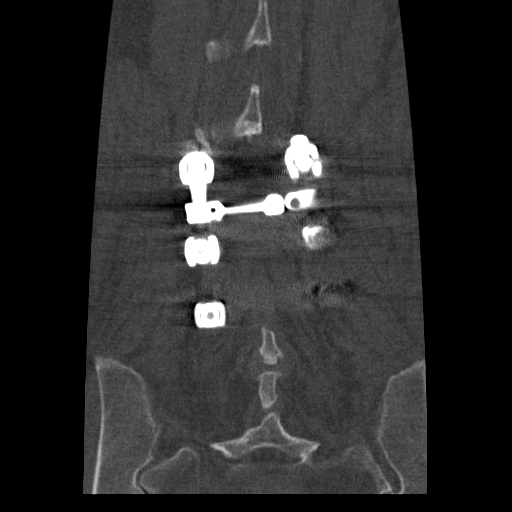
[im 20/50  bone]
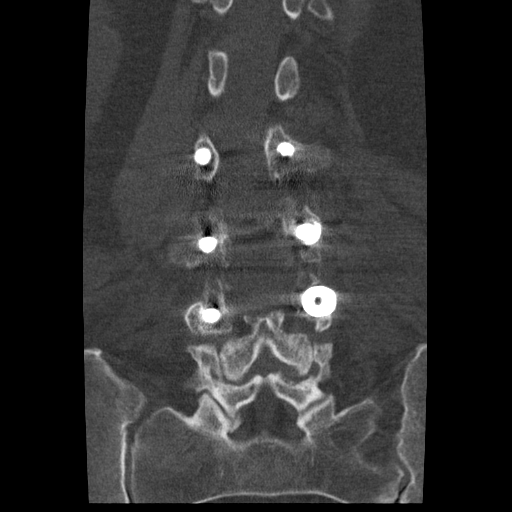
[im 30/50  bone]
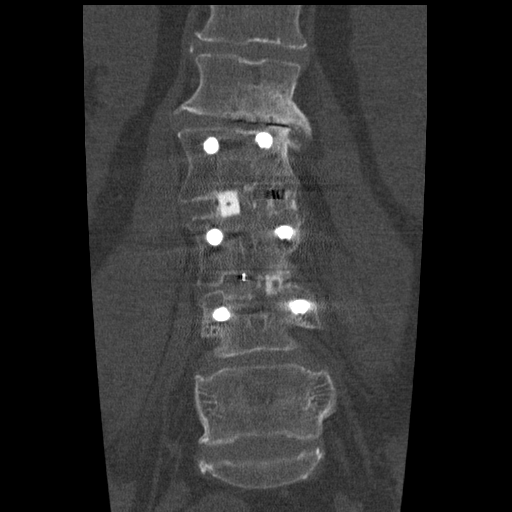

[Series 401: sag · sagittal · 0.41mm/px · 5 of 45 slices shown, 6 images]
[im 15/45  bone]
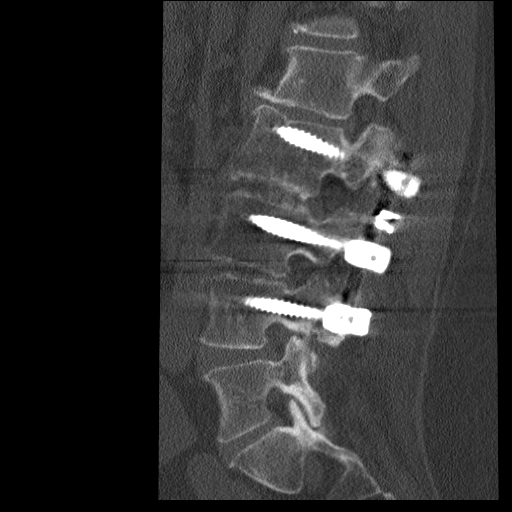
[im 19/45  bone]
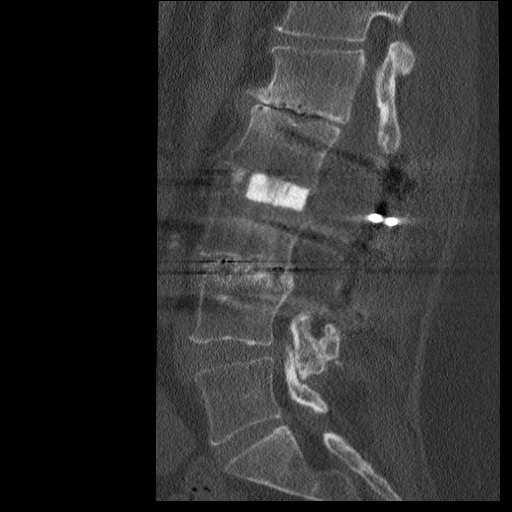
[im 23/45  soft-tissue]
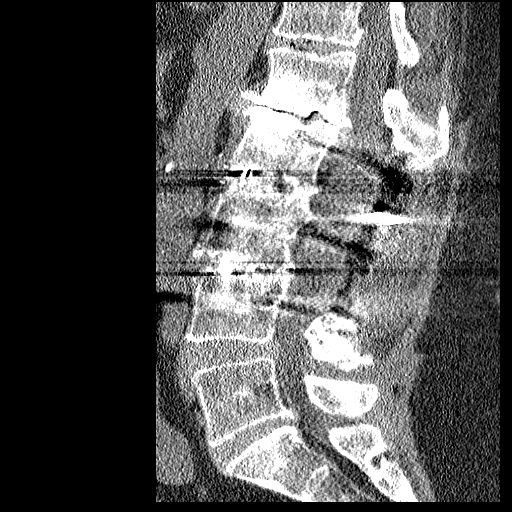
[im 23/45  bone]
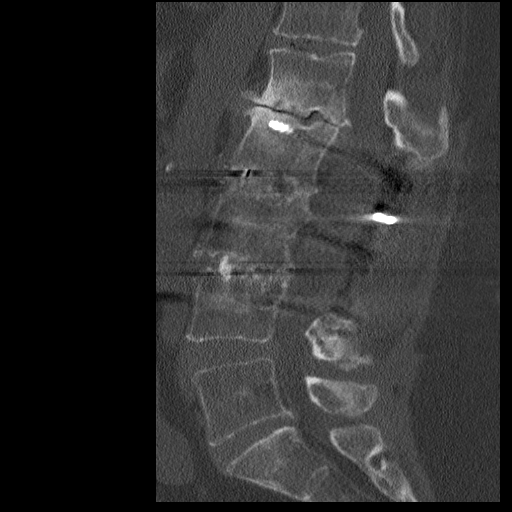
[im 26/45  bone]
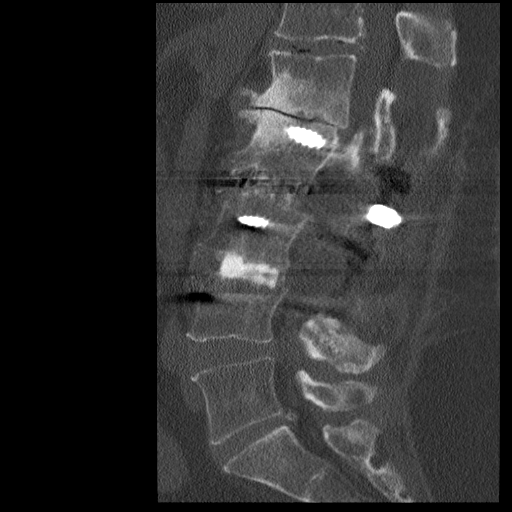
[im 30/45  bone]
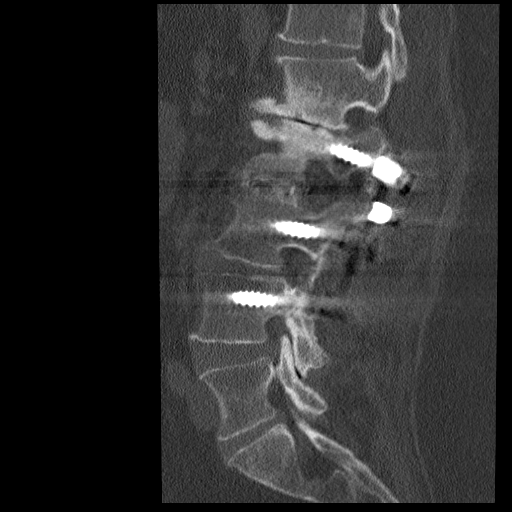

[12 of 33 positions shown; findings below may reference images not displayed]

FINDINGS: There is thoracolumbar curvature convex to the right in mild lower
lumbar curvature convex to the left.

T12-L1: Disc degeneration with mild vacuum phenomenon. Bulging of
the disc. No apparent compressive stenosis.

L1-2: Continued progressive degenerative disc disease with loss of
disc height more on the left than the right. Vacuum phenomenon.
Endplate sclerosis worse on the left. Leftward projecting lateral
osteophytes. Retrolisthesis of 2 mm. Narrowing of the lateral
recesses without definite compressive stenosis.

L2 through L4: Previous decompression, diskectomy and fusion. Fusion
is solid in at segment. Sufficient patency of the canal and
foramina.

L4-5: Circumferential bulging of the disc. Bilateral facet
arthropathy. Stenosis of both lateral recesses could cause neural
compression. This appearance could worsen with standing or flexion.

L5-S1: Chronic endplate osteophytes, annular bulging and annular
calcification. Mild bilateral facet degeneration. There is contact
with the S1 nerve roots in the could be irritated. This is a chronic
appearance however.

Bilateral sacroiliac arthropathy could contribute pain.
IMPRESSION: Progressive adjacent segment degenerative disease at L1-2 worse on
the left than the right. Stenosis of the lateral recesses that could
possibly be symptomatic. Prominent leftward projecting osteophytes.

Continued good appearance in the fusion segment from L2 through L4.

Adjacent segment degenerative disease at L4-5 with circumferential
bulging of the disc and facet and ligamentous hypertrophy. Stenosis
of the lateral recesses could cause neural compression. This could
worsen with standing or flexion.

## 2014-11-06 IMAGING — CR DG CERVICAL SPINE COMPLETE 4+V
5 series · 5 of 5 positions shown · non-contrast
Comparison: Cervical MRI, 07/19/2013

CLINICAL DATA: Neck pain for 5 months. History of cervical fusion
surgery.

EXAM:
CERVICAL SPINE  4+ VIEWS

[view not recorded (1 of 5)]
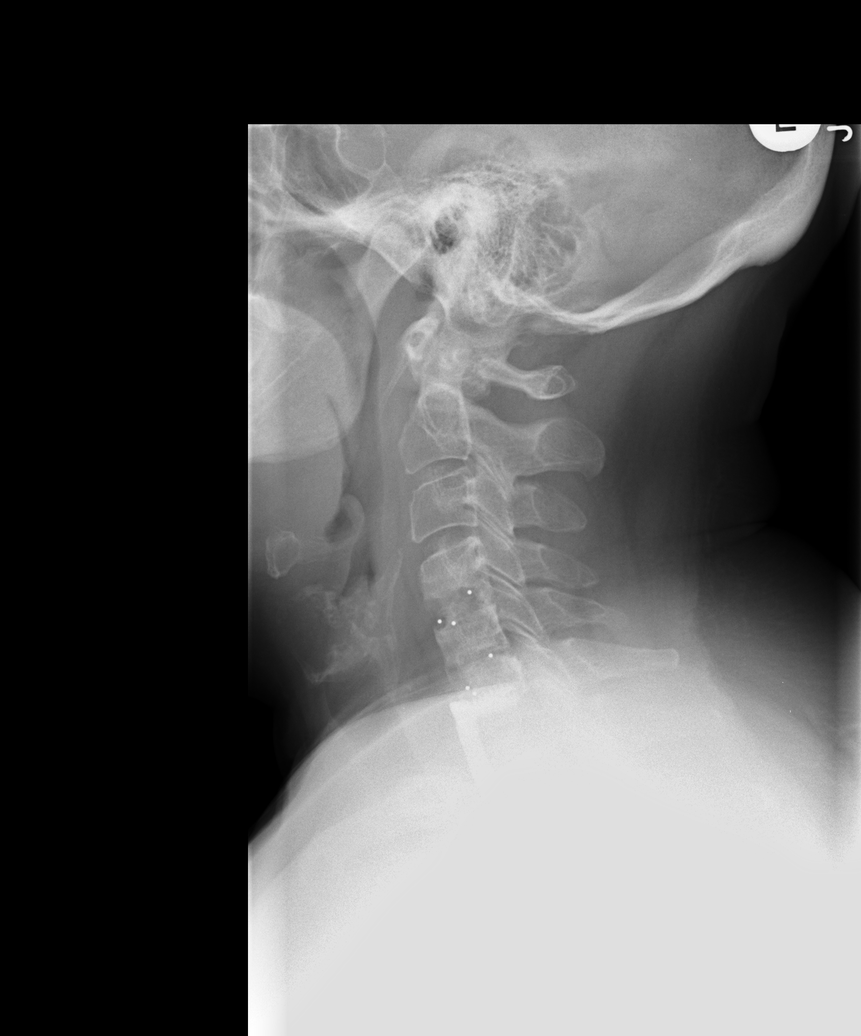

[view not recorded (2 of 5)]
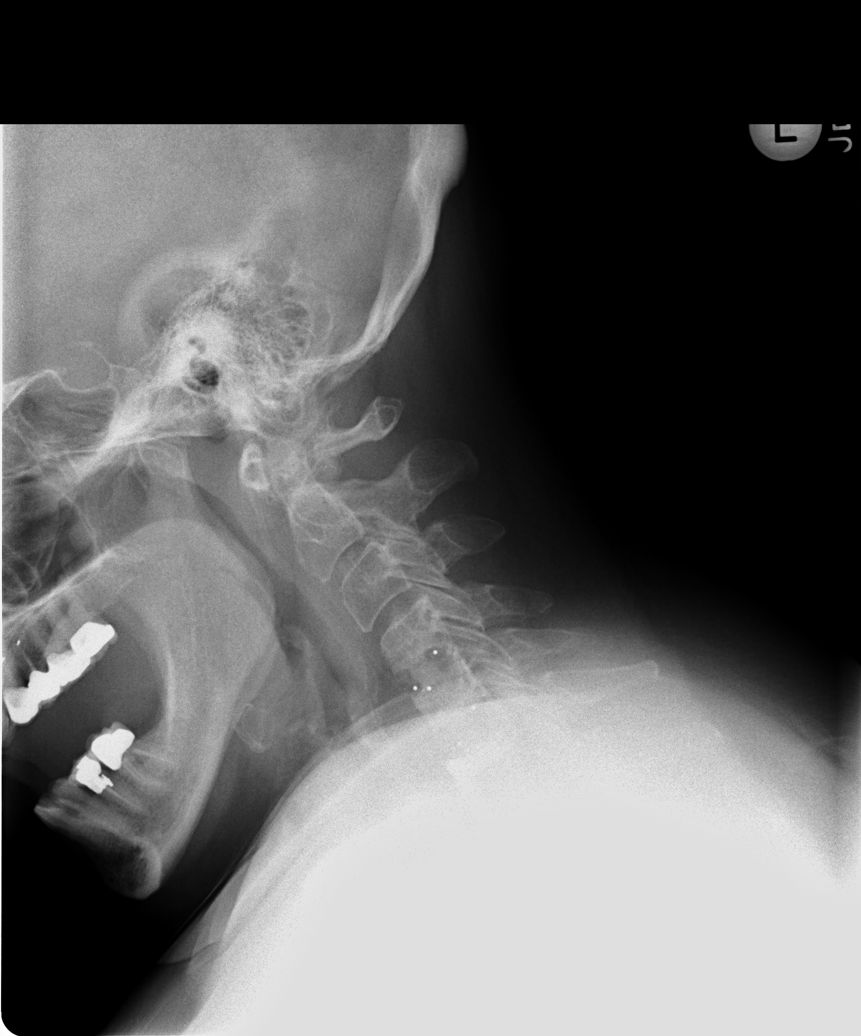

[view not recorded (3 of 5)]
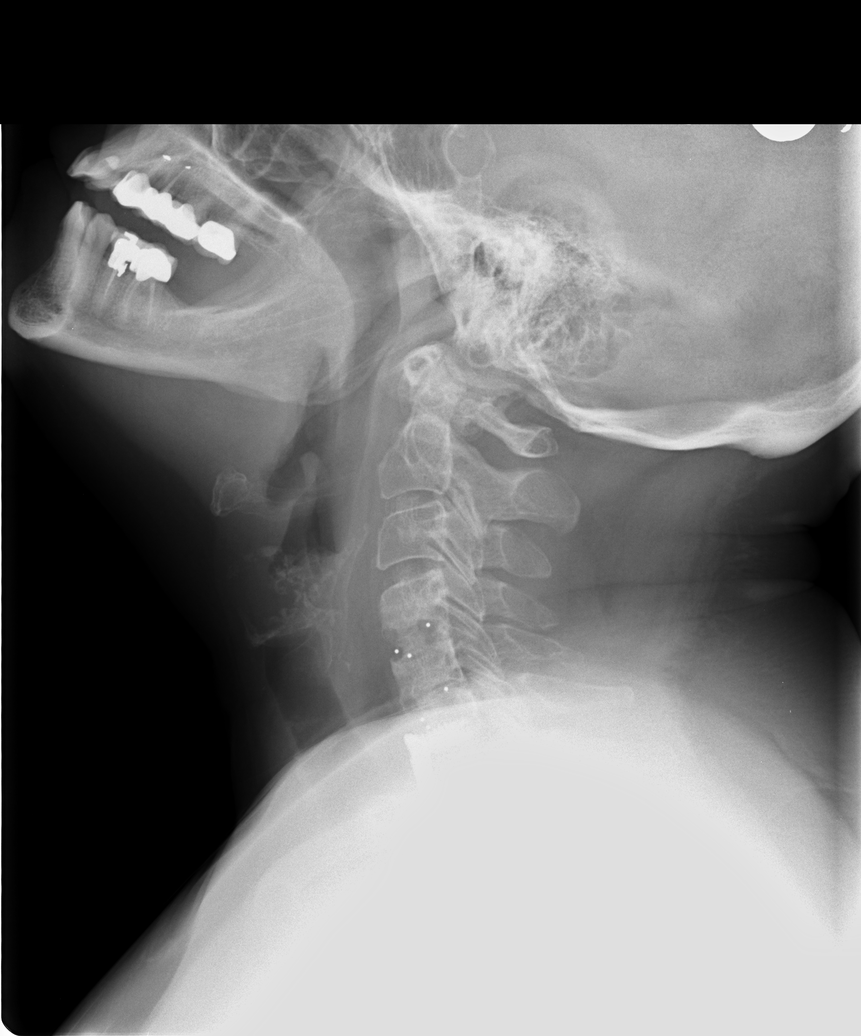

[view not recorded (4 of 5)]
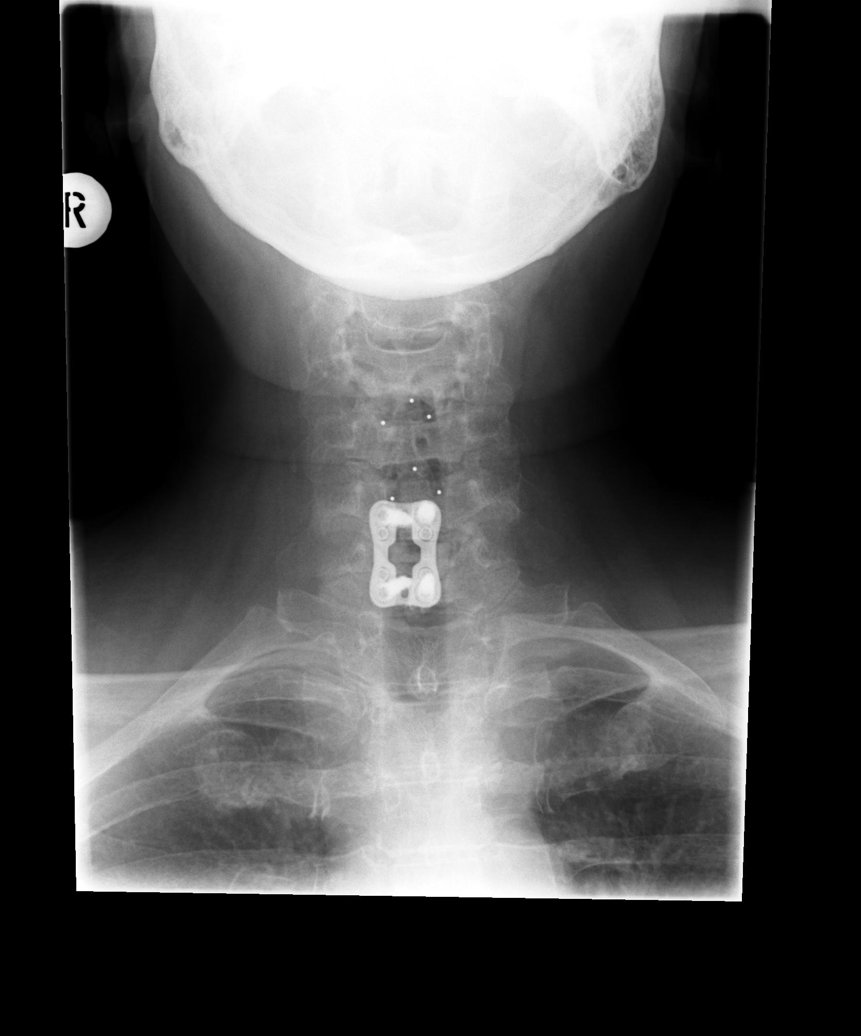

[view not recorded (5 of 5)]
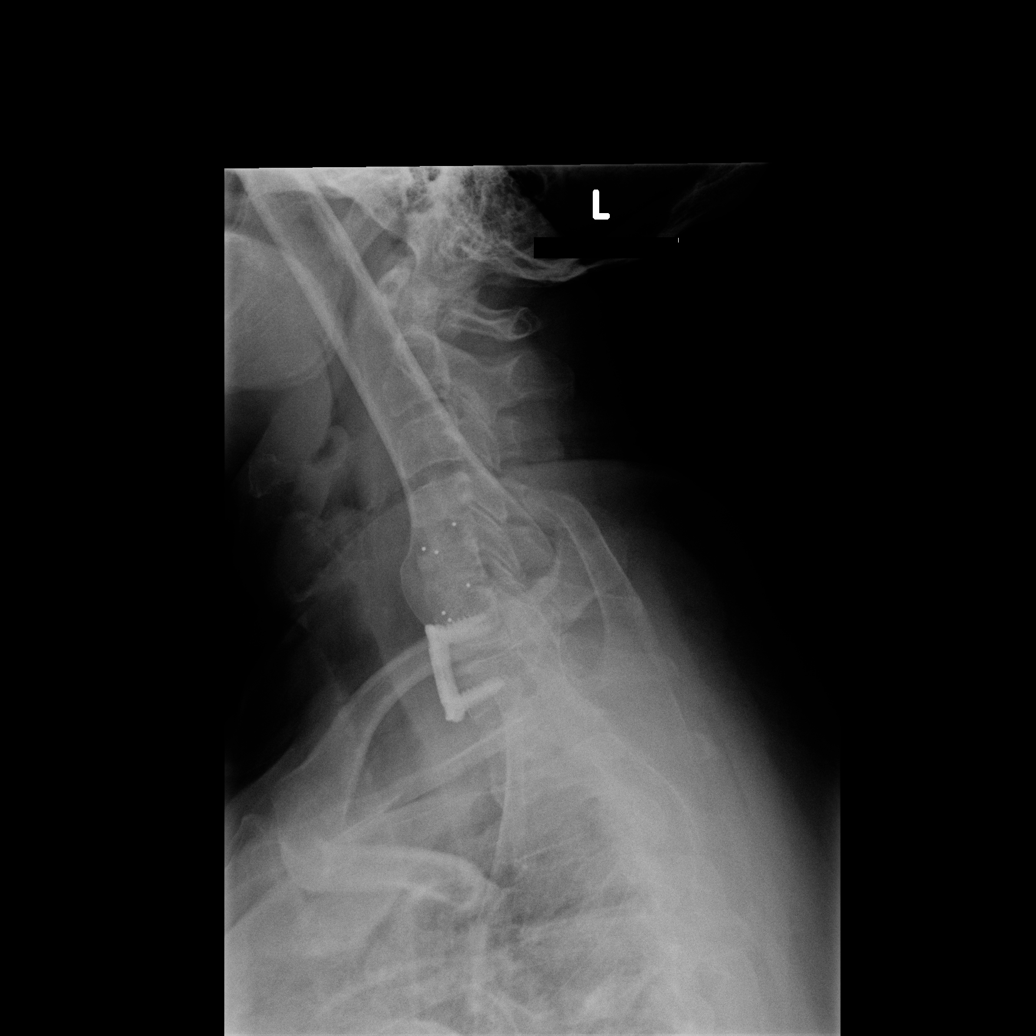

[5 of 5 positions shown; findings below may reference images not displayed]

FINDINGS: Can't anterior fusion plate spans C6-C7. The fixation screws are
well-seated. There is no evidence of loosening of the orthopedic
hardware.

Radiolucent disc spacers and bone graft material few C4-C5 and
C5-C6.

There is no fracture.  There is no spondylolisthesis.

Flexion and extension views show no subluxation. There is no
movement of the fused levels or orthopedic hardware.
IMPRESSION: 1. No fracture or spondylolisthesis.
2. No evidence of loosening or malalignment of the orthopedic
hardware or bone graft material.
3. No subluxation with flexion or extension.

## 2014-11-12 ENCOUNTER — Encounter: Payer: Self-pay | Admitting: Physical Therapy

## 2014-11-12 ENCOUNTER — Ambulatory Visit: Payer: Medicare Other | Admitting: Physical Therapy

## 2014-11-12 DIAGNOSIS — M545 Low back pain, unspecified: Secondary | ICD-10-CM

## 2014-11-12 DIAGNOSIS — R262 Difficulty in walking, not elsewhere classified: Secondary | ICD-10-CM

## 2014-11-12 NOTE — Therapy (Signed)
[place], [place], [address] Phone: [phone]   Fax:  [phone]  Physical Therapy Treatment  Patient Details  Name: Sara Cross MRN: [id number] Date of Birth: 1955/12/20 Referring Provider:  Kary Kos, MD  Encounter Date: 11/12/2014      PT End of Session - 11/12/14 1154    Visit Number 5   Number of Visits 16   Date for PT Re-Evaluation 12/15/14   PT Start Time 1100   PT Stop Time 1154   PT Time Calculation (min) 54 min   Activity Tolerance Patient limited by pain;Patient limited by fatigue   Behavior During Therapy [facility] for tasks assessed/performed      Past Medical History  Diagnosis Date  . Depression   . Pneumonia     hx of  . Arthritis   . Morbid obesity   . Anxiety   . Sleep apnea     wear cpap nightly  . GERD (gastroesophageal reflux disease)   . Headache(784.0)   . Dizziness   . DDD (degenerative disc disease)   . Hypertension   . Hyperlipidemia   . Diabetes mellitus     cbg fasting 170-200    Past Surgical History  Procedure Laterality Date  . Back surgery      01/2011  . Hand surgery      right   2011    FOR INJURY   . Cesearean      X2  . Toenail excision      BIG TOE RIGHT FOOT  . Toe nail removal      right great toe  . Abdominal hysterectomy  2004  . Colonoscopy    . Anterior cervical decomp/discectomy fusion  12/23/2011    Procedure: ANTERIOR CERVICAL DECOMPRESSION/DISCECTOMY FUSION 2 LEVELS;  Surgeon: Elaina Hoops, MD;  Location: [place] [facility];  Service: Neurosurgery;  Laterality: N/A;  Anterior Cervical Four-Five/Five-Six Decompression and Fusion  . Carpal tunnel release  12/23/2011    Procedure: CARPAL TUNNEL RELEASE;  Surgeon: Elaina Hoops, MD;  Location: [place] [facility];  Service: Neurosurgery;  Laterality: Left;  Left Carpal Tunnel Release  . Anterior cervical decomp/discectomy fusion N/A 06/07/2013    Procedure: ANTERIOR CERVICAL  DECOMPRESSION/DISCECTOMY FUSION 1 LEVEL/HARDWARE REMOVAL;  Surgeon: Elaina Hoops, MD;  Location: [place] [facility];  Service: Neurosurgery;  Laterality: N/A;  ANTERIOR CERVICAL DECOMPRESSION/DISCECTOMY FUSION 1 LEVEL/HARDWARE REMOVAL  . Lumbar laminectomy/decompression microdiscectomy N/A 08/29/2014    Procedure: Thoracic seven-eight  microdiskectomy;  Surgeon: Elaina Hoops, MD;  Location: [place];  Service: Neurosurgery;  Laterality: N/A;    There were no vitals filed for this visit.  Visit Diagnosis:  Midline low back pain without sciatica  Difficulty walking      Subjective Assessment - 11/12/14 1117    Symptoms Had a bad night last night   Currently in Pain? Yes   Pain Score 4    Pain Location Back   Pain Orientation Lower                       [facility] Adult PT Treatment/Exercise - 11/12/14 0001    Exercises   Exercises Lumbar   Lumbar Exercises: Aerobic   Stationary Bike 6 minutes  Nustep level 5   Lumbar Exercises: Standing   Other Standing Lumbar Exercises 5# pull to midline  2 sets   Lumbar Exercises: Supine   Bridge 10 reps  2  sets   Straight Leg Raise 10 reps  2 sets   Other Supine Lumbar Exercises KTC, rotation with ball  2x15   Modalities   Modalities Electrical Stimulation;Moist Heat                  PT Short Term Goals - 10/22/14 1453    PT SHORT TERM GOAL #1   Title independent with initial HEP   Time 2   Period Weeks   Status Achieved           PT Long Term Goals - 10/22/14 1453    PT LONG TERM GOAL #1   Title independent with proper posture and body mechanics for ADL's   Time 8   Period Weeks   Status Achieved   PT LONG TERM GOAL #2   Title increase lumbar ROM 25%   Time 8   Period Weeks   Status On-going   PT LONG TERM GOAL #3   Title put on shoes and socks without assistance   Time 8   Period Weeks   Status On-going   PT LONG TERM GOAL #4   Title decrease pain 25%   Baseline 7/10   Time 8   Period Weeks    Status On-going               Problem List Patient Active Problem List   Diagnosis Date Noted  . Spinal stenosis of lumbar region 08/29/2014  . ADD 04/02/2014  . Diabetes mellitus type 2 with neurological manifestations 02/26/2014  . Vitamin D deficiency 02/26/2014  . Encounter for long-term (current) use of medications 02/26/2014  . Hyperlipidemia 11/26/2013  . Palpitations 11/26/2013  . Diabetic neuropathy 11/01/2013  . DDD (degenerative disc disease)   . Depression   . Anxiety   . OSA/CPAP   . GERD (gastroesophageal reflux disease)   . FIBROIDS, UTERUS 02/17/2009  . Essential hypertension 02/17/2009    Kemari Mares PTA 11/12/2014, 11:56 AM  [place], [place], [address] Phone: [phone]   Fax:  [phone]

## 2014-11-13 ENCOUNTER — Encounter: Payer: Self-pay | Admitting: Emergency Medicine

## 2014-11-13 ENCOUNTER — Ambulatory Visit (INDEPENDENT_AMBULATORY_CARE_PROVIDER_SITE_OTHER): Payer: Medicare Other | Admitting: Emergency Medicine

## 2014-11-13 VITALS — BP 118/70 | HR 100 | Temp 98.0°F | Resp 18 | Ht 63.0 in

## 2014-11-13 DIAGNOSIS — R5381 Other malaise: Secondary | ICD-10-CM

## 2014-11-13 DIAGNOSIS — Z1231 Encounter for screening mammogram for malignant neoplasm of breast: Secondary | ICD-10-CM

## 2014-11-13 DIAGNOSIS — I10 Essential (primary) hypertension: Secondary | ICD-10-CM

## 2014-11-13 DIAGNOSIS — F988 Other specified behavioral and emotional disorders with onset usually occurring in childhood and adolescence: Secondary | ICD-10-CM

## 2014-11-13 DIAGNOSIS — E782 Mixed hyperlipidemia: Secondary | ICD-10-CM

## 2014-11-13 DIAGNOSIS — E119 Type 2 diabetes mellitus without complications: Secondary | ICD-10-CM

## 2014-11-13 DIAGNOSIS — Z23 Encounter for immunization: Secondary | ICD-10-CM

## 2014-11-13 DIAGNOSIS — Z780 Asymptomatic menopausal state: Secondary | ICD-10-CM

## 2014-11-13 DIAGNOSIS — R6889 Other general symptoms and signs: Secondary | ICD-10-CM

## 2014-11-13 DIAGNOSIS — Z0000 Encounter for general adult medical examination without abnormal findings: Secondary | ICD-10-CM

## 2014-11-13 DIAGNOSIS — R5383 Other fatigue: Secondary | ICD-10-CM

## 2014-11-13 DIAGNOSIS — R300 Dysuria: Secondary | ICD-10-CM

## 2014-11-13 DIAGNOSIS — Z0001 Encounter for general adult medical examination with abnormal findings: Secondary | ICD-10-CM

## 2014-11-13 DIAGNOSIS — E559 Vitamin D deficiency, unspecified: Secondary | ICD-10-CM

## 2014-11-13 DIAGNOSIS — F909 Attention-deficit hyperactivity disorder, unspecified type: Secondary | ICD-10-CM

## 2014-11-13 DIAGNOSIS — Z1212 Encounter for screening for malignant neoplasm of rectum: Secondary | ICD-10-CM

## 2014-11-13 LAB — BASIC METABOLIC PANEL WITH GFR
BUN: 14 mg/dL (ref 6–23)
CHLORIDE: 98 meq/L (ref 96–112)
CO2: 26 mEq/L (ref 19–32)
Calcium: 9.7 mg/dL (ref 8.4–10.5)
Creat: 0.61 mg/dL (ref 0.50–1.10)
GLUCOSE: 124 mg/dL — AB (ref 70–99)
POTASSIUM: 4 meq/L (ref 3.5–5.3)
SODIUM: 136 meq/L (ref 135–145)

## 2014-11-13 LAB — CBC WITH DIFFERENTIAL/PLATELET
BASOS ABS: 0.1 10*3/uL (ref 0.0–0.1)
BASOS PCT: 1 % (ref 0–1)
EOS ABS: 0.4 10*3/uL (ref 0.0–0.7)
EOS PCT: 6 % — AB (ref 0–5)
HCT: 37.6 % (ref 36.0–46.0)
Hemoglobin: 12.1 g/dL (ref 12.0–15.0)
Lymphocytes Relative: 26 % (ref 12–46)
Lymphs Abs: 1.8 10*3/uL (ref 0.7–4.0)
MCH: 26.9 pg (ref 26.0–34.0)
MCHC: 32.2 g/dL (ref 30.0–36.0)
MCV: 83.7 fL (ref 78.0–100.0)
MONO ABS: 0.7 10*3/uL (ref 0.1–1.0)
MPV: 9.7 fL (ref 8.6–12.4)
Monocytes Relative: 10 % (ref 3–12)
Neutro Abs: 3.9 10*3/uL (ref 1.7–7.7)
Neutrophils Relative %: 57 % (ref 43–77)
PLATELETS: 332 10*3/uL (ref 150–400)
RBC: 4.49 MIL/uL (ref 3.87–5.11)
RDW: 14.9 % (ref 11.5–15.5)
WBC: 6.9 10*3/uL (ref 4.0–10.5)

## 2014-11-13 LAB — HEPATIC FUNCTION PANEL
ALT: 18 U/L (ref 0–35)
AST: 14 U/L (ref 0–37)
Albumin: 4.4 g/dL (ref 3.5–5.2)
Alkaline Phosphatase: 145 U/L — ABNORMAL HIGH (ref 39–117)
Total Bilirubin: 0.2 mg/dL (ref 0.2–1.2)
Total Protein: 7.3 g/dL (ref 6.0–8.3)

## 2014-11-13 LAB — LIPID PANEL
CHOL/HDL RATIO: 3.8 ratio
Cholesterol: 211 mg/dL — ABNORMAL HIGH (ref 0–200)
HDL: 55 mg/dL (ref >=46)
LDL Cholesterol: 116 mg/dL — ABNORMAL HIGH (ref 0–99)
Triglycerides: 202 mg/dL — ABNORMAL HIGH (ref <150)
VLDL: 40 mg/dL (ref 0–40)

## 2014-11-13 LAB — MAGNESIUM: MAGNESIUM: 1.8 mg/dL (ref 1.5–2.5)

## 2014-11-13 MED ORDER — AMPHETAMINE-DEXTROAMPHETAMINE 20 MG PO TABS: ORAL_TABLET | ORAL | Status: DC

## 2014-11-13 NOTE — Progress Notes (Signed)
Subjective:     Patient ID: Sara Cross, female   DOB: 1956/03/21, 59 y.o.   MRN: [id number]  HPI Comments: 59 yo WF CPE and presents for 3 month F/U for HTN, Cholesterol, DM, D. deficient . She has not been exercising with 08/29/14 back surgery by Dr Saintclair Halsted and has to use walker. She is considering breast reduction due to continued pull on back and neck from large breasts. She is trying to eat healthier and lose weight. BS was higher initially with back surgery but has improved. She is in PT for back and walking for exercise. She is eating a little healthier. She has lost 20 lbs. She is taking medicine AD and denies CV Complaints. SHe checks feet routinely and denies skin break down or neuropathy increase.   She can focus better with Adderall and needs refill. She has not been taking routinely with recent surgery. She denies adverse SE with Adderall.  She notes increased size and pigment in mole on left chin.   She has had mild dysuria and fatigue since surgery.   She had abnormal CT Chest and is due for repeat in 06/2015 with concern for pulmonary nodules.    Lab Results      Component                Value               Date                      WBC                      8.3                 09/02/2014                HGB                      8.5*                09/02/2014                HCT                      25.7*               09/02/2014                PLT                      238                 09/02/2014                GLUCOSE                  112*                08/29/2014                CHOL                     174                 06/06/2014                TRIG  287*                06/06/2014                HDL                      47                  06/06/2014                LDLCALC                  70                  06/06/2014                ALT                      13                  06/06/2014                AST                      12                   06/06/2014                NA                       138                 08/29/2014                K                        4.6                 08/29/2014                CL                       101                 08/21/2014                CREATININE               0.61                08/21/2014                BUN                      12                  08/21/2014                CO2                      28                  08/21/2014                TSH  1.750               06/06/2014                HGBA1C                   6.4*                06/06/2014               07/14/2014 CT CHEST IMPRESSION: 1. The posterior left upper lobe pleural-based nodule is unchanged, consistent with a benign etiology. 2. Right lower lobe minimal nodularity is unchanged. A subpleural right upper lobe density favored to represent a area of pleural thickening or a subpleural lymph node. As this was not imaged on prior nondedicated studies, followup at 1 year is suggested if the patient has risk factors for primary bronchogenic carcinoma. This recommendation follows the consensus statement: Guidelines for Management of Small Pulmonary Nodules Detected on CT Scans: A Statement from the [place] as published in Radiology 2005; 237:395-400. 3. Suspicion of hepatic steatosis.      Medication List       This list is accurate as of: 11/13/14  3:58 PM.  Always use your most recent med list.               acetaminophen 500 MG tablet  Commonly known as:  TYLENOL  Take 1,000 mg by mouth every 6 (six) hours as needed.     ALPRAZolam 1 MG tablet  Commonly known as:  XANAX  TAKE ONE TABLET BY MOUTH TWICE DAILY.     amphetamine-dextroamphetamine 20 MG tablet  Commonly known as:  ADDERALL  Take 1/2 to 1 tablet 2 or 3 x day if needed for ADD     buPROPion 300 MG 24 hr tablet  Commonly known as:  WELLBUTRIN XL  Take 1 tablet (300 mg total) by mouth daily.      cyclobenzaprine 10 MG tablet  Commonly known as:  FLEXERIL  Take 1 tablet (10 mg total) by mouth at bedtime.     Fluticasone Furoate-Vilanterol 100-25 MCG/INH Aepb  Commonly known as:  BREO ELLIPTA  1 puff daily for breathing     furosemide 20 MG tablet  Commonly known as:  LASIX  TAKE TWO TABLETS BY MOUTH ONCE DAILY     ibuprofen 200 MG tablet  Commonly known as:  ADVIL,MOTRIN  Take 800 mg by mouth every 4 (four) hours as needed.     lisinopril 40 MG tablet  Commonly known as:  PRINIVIL,ZESTRIL  TAKE ONE TABLET BY MOUTH ONCE DAILY     MAGNESIUM PO  Take 400 mg by mouth daily.     metFORMIN 500 MG 24 hr tablet  Commonly known as:  GLUCOPHAGE-XR  TAKE TWO TABLETS BY MOUTH TWICE DAILY     omeprazole 20 MG capsule  Commonly known as:  PRILOSEC  Take 1 capsule (20 mg total) by mouth daily.     oxyCODONE-acetaminophen 5-325 MG per tablet  Commonly known as:  PERCOCET/ROXICET  Take 1 tablet by mouth every 6 (six) hours as needed for pain.     SUPER B COMPLEX PO  Take 1 tablet by mouth daily.     VITAMIN A EX  Apply topically daily.     VITAMIN D PO  Take 1 capsule by mouth daily.     vitamin E 400 UNIT capsule  Generic drug:  vitamin E  Take 800 Units by mouth  daily.       Allergies  Allergen Reactions  . Benicar [Olmesartan]   . Lortab [Hydrocodone-Acetaminophen] Itching  . Ziac [Bisoprolol-Hydrochlorothiazide]    Past Medical History  Diagnosis Date  . Depression   . Pneumonia     hx of  . Arthritis   . Morbid obesity   . Anxiety   . Sleep apnea     wear cpap nightly  . GERD (gastroesophageal reflux disease)   . Headache(784.0)   . Dizziness   . DDD (degenerative disc disease)   . Hypertension   . Hyperlipidemia   . Diabetes mellitus     cbg fasting 170-200   Past Surgical History  Procedure Laterality Date  . Back surgery      01/2011  . Hand surgery      right   2011    FOR INJURY   . Cesearean      X2  . Toenail excision      BIG TOE  RIGHT FOOT  . Toe nail removal      right great toe  . Abdominal hysterectomy  2004  . Colonoscopy    . Anterior cervical decomp/discectomy fusion  12/23/2011    Procedure: ANTERIOR CERVICAL DECOMPRESSION/DISCECTOMY FUSION 2 LEVELS;  Surgeon: Elaina Hoops, MD;  Location: [facility];  Service: Neurosurgery;  Laterality: N/A;  Anterior Cervical Four-Five/Five-Six Decompression and Fusion  . Carpal tunnel release  12/23/2011    Procedure: CARPAL TUNNEL RELEASE;  Surgeon: Elaina Hoops, MD;  Location: [place] [facility];  Service: Neurosurgery;  Laterality: Left;  Left Carpal Tunnel Release  . Anterior cervical decomp/discectomy fusion N/A 06/07/2013    Procedure: ANTERIOR CERVICAL DECOMPRESSION/DISCECTOMY FUSION 1 LEVEL/HARDWARE REMOVAL;  Surgeon: Elaina Hoops, MD;  Location: [facility];  Service: Neurosurgery;  Laterality: N/A;  ANTERIOR CERVICAL DECOMPRESSION/DISCECTOMY FUSION 1 LEVEL/HARDWARE REMOVAL  . Lumbar laminectomy/decompression microdiscectomy N/A 08/29/2014    Procedure: Thoracic seven-eight  microdiskectomy;  Surgeon: Elaina Hoops, MD;  Location: [facility];  Service: Neurosurgery;  Laterality: N/A;    History  Substance Use Topics  . Smoking status: Former Smoker -- 0.25 packs/day for 30 years    Types: Cigarettes    Quit date: 07/10/2014  . Smokeless tobacco: Never Used     Comment: e-cig non-nicotine  . Alcohol Use: 0.0 oz/week    0 Standard drinks or equivalent per week     Comment: RARELY - 2 drinks a month   Family History  Problem Relation Age of Onset  . Anesthesia problems Neg Hx   . Hypotension Neg Hx   . Malignant hyperthermia Neg Hx   . Pseudochol deficiency Neg Hx   . Cancer Father     ORAL  . Hypertension Other   . Hyperlipidemia Other   . Sleep apnea Other   . Obesity Other   . Arthritis Mother   . Diabetes Sister   . Hyperlipidemia Sister   . Diabetes Maternal Grandmother   . Hypertension Maternal Grandmother   . Hyperlipidemia Maternal Grandmother   .  Heart disease Maternal Grandmother    MAINTENANCE: Colonoscopy:2012 WNL 2022 Mammo:09/08/2011 BMD: OVERDUE Pap/ Pelvic:2011 PAP with Physt EYE:07/2014 WNL Dentist:q 6 month ECHO:55-60%  IMMUNIZATIONS: Tdap: 3/10/14/14 Pneumovax:2015 Zostavax: 2011 Influenza: 2015  Patient Care Team: Unk Pinto, MD as PCP - General (Internal Medicine) Servando Salina, MD as Consulting Physician (Obstetrics and Gynecology) Kary Kos, MD as Consulting Physician (Neurosurgery) Juanita Craver, MD as Consulting Physician (Gastroenterology) Jere Vanburen Noon, OD as Referring  Physician (Optometry)  [place], (Dentist)  Review of Systems  Constitutional: Positive for fatigue.  Respiratory: Negative for shortness of breath.   Cardiovascular: Negative for chest pain.  Genitourinary: Positive for dysuria.  Musculoskeletal: Positive for back pain.  Skin: Positive for color change.  All other systems reviewed and are negative.  BP 118/70 mmHg  Pulse 100  Temp(Src) 98 F (36.7 C) (Temporal)  Resp 18  Ht 5\' 3"  (1.6 m)  PF 201 L/min     Objective:   Physical Exam  Constitutional: She is oriented to person, place, and time. She appears well-developed and well-nourished. No distress.  HENT:  Head: Normocephalic.  Nose: Nose normal.  Mouth/Throat: Oropharynx is clear and moist.  Eyes: Conjunctivae and EOM are normal. Pupils are equal, round, and reactive to light. No scleral icterus.  Neck: Normal range of motion. Neck supple. No JVD present. No tracheal deviation present. No thyromegaly present.  Cardiovascular: Normal rate, regular rhythm, normal heart sounds and intact distal pulses.   Pulmonary/Chest: Effort normal and breath sounds normal.  Abdominal: Soft. Bowel sounds are normal. She exhibits no distension and no mass. There is no tenderness.  Genitourinary:  Breasts: WNL  Musculoskeletal: She exhibits no edema or tenderness.  Decreased ROM with lumbar region with recent back surgery    Lymphadenopathy:    She has no cervical adenopathy.  Neurological: She is alert and oriented to person, place, and time. She has normal reflexes. No cranial nerve deficit. She exhibits normal muscle tone. Coordination normal.  Skin: Skin is warm and dry. No rash noted. No erythema.     Mid-upper right back dark flat irregular. Left chin with medium brown flat irregular. Surgical scar healing appropriately  Psychiatric: She has a normal mood and affect. Her behavior is normal. Judgment and thought content normal.  Nursing note and vitals reviewed.  AORTA SCAN WNL EKG NSCSPT     Assessment:     CPE  SEE PLAN     Plan:     1. CPE- Update screening labs/ History/ Immunizations/ Testing as needed. Advised healthy diet, QD exercise, increase H20 and continue RX/ Vitamins AD.  2. 3 month F/U for HTN, Cholesterol,DM, D. Deficient. Needs healthy diet, cardio QD and obtain healthy weight. Check Labs, Check BP if >130/80 call office, Check BS if >200 call office  3. Fatigue- check labs, increase activity and H2O   4. Dysuria- check urine with recent surgery  5. ADD- Controlled refill RX AD.     6.? Skin irritation from back brace vs  Early shingles- advised monitor closely. Call with any increased symptoms  7.  Irreg Nevi- monitor for any change, refer for removal   8. ABNORMAL CT CHEST NEEDS REPEATED 06/2015, PATIENT Will call for remidner of scan

## 2014-11-13 NOTE — Patient Instructions (Signed)
Tdap Vaccine (Tetanus, Diphtheria, Pertussis): What You Need to Know 1. Why get vaccinated? Tetanus, diphtheria and pertussis can be very serious diseases, even for adolescents and adults. Tdap vaccine can protect us from these diseases. TETANUS (Lockjaw) causes painful muscle tightening and stiffness, usually all over the body.  It can lead to tightening of muscles in the head and neck so you can't open your mouth, swallow, or sometimes even breathe. Tetanus kills about 1 out of 5 people who are infected. DIPHTHERIA can cause a thick coating to form in the back of the throat.  It can lead to breathing problems, paralysis, heart failure, and death. PERTUSSIS (Whooping Cough) causes severe coughing spells, which can cause difficulty breathing, vomiting and disturbed sleep.  It can also lead to weight loss, incontinence, and rib fractures. Up to 2 in 100 adolescents and 5 in 100 adults with pertussis are hospitalized or have complications, which could include pneumonia or death. These diseases are caused by bacteria. Diphtheria and pertussis are spread from person to person through coughing or sneezing. Tetanus enters the body through cuts, scratches, or wounds. Before vaccines, the [place] saw as many as 200,000 cases a year of diphtheria and pertussis, and hundreds of cases of tetanus. Since vaccination began, tetanus and diphtheria have dropped by about 99% and pertussis by about 80%. 2. Tdap vaccine Tdap vaccine can protect adolescents and adults from tetanus, diphtheria, and pertussis. One dose of Tdap is routinely given at age 11 or 12. People who did not get Tdap at that age should get it as soon as possible. Tdap is especially important for health care professionals and anyone having close contact with a baby younger than 12 months. Pregnant women should get a dose of Tdap during every pregnancy, to protect the newborn from pertussis. Infants are most at risk for severe, life-threatening  complications from pertussis. A similar vaccine, called Td, protects from tetanus and diphtheria, but not pertussis. A Td booster should be given every 10 years. Tdap may be given as one of these boosters if you have not already gotten a dose. Tdap may also be given after a severe cut or burn to prevent tetanus infection. Your doctor can give you more information. Tdap may safely be given at the same time as other vaccines. 3. Some people should not get this vaccine  If you ever had a life-threatening allergic reaction after a dose of any tetanus, diphtheria, or pertussis containing vaccine, OR if you have a severe allergy to any part of this vaccine, you should not get Tdap. Tell your doctor if you have any severe allergies.  If you had a coma, or long or multiple seizures within 7 days after a childhood dose of DTP or DTaP, you should not get Tdap, unless a cause other than the vaccine was found. You can still get Td.  Talk to your doctor if you:  have epilepsy or another nervous system problem,  had severe pain or swelling after any vaccine containing diphtheria, tetanus or pertussis,  ever had Guillain-Barr Syndrome (GBS),  aren't feeling well on the day the shot is scheduled. 4. Risks of a vaccine reaction With any medicine, including vaccines, there is a chance of side effects. These are usually mild and go away on their own, but serious reactions are also possible. Brief fainting spells can follow a vaccination, leading to injuries from falling. Sitting or lying down for about 15 minutes can help prevent these. Tell your doctor if you feel   dizzy or light-headed, or have vision changes or ringing in the ears. Mild problems following Tdap (Did not interfere with activities)  Pain where the shot was given (about 3 in 4 adolescents or 2 in 3 adults)  Redness or swelling where the shot was given (about 1 person in 5)  Mild fever of at least 100.46F (up to about 1 in 25 adolescents or  1 in 100 adults)  Headache (about 3 or 4 people in 10)  Tiredness (about 1 person in 3 or 4)  Nausea, vomiting, diarrhea, stomach ache (up to 1 in 4 adolescents or 1 in 10 adults)  Chills, body aches, sore joints, rash, swollen glands (uncommon) Moderate problems following Tdap (Interfered with activities, but did not require medical attention)  Pain where the shot was given (about 1 in 5 adolescents or 1 in 100 adults)  Redness or swelling where the shot was given (up to about 1 in 16 adolescents or 1 in 25 adults)  Fever over 102F (about 1 in 100 adolescents or 1 in 250 adults)  Headache (about 3 in 20 adolescents or 1 in 10 adults)  Nausea, vomiting, diarrhea, stomach ache (up to 1 or 3 people in 100)  Swelling of the entire arm where the shot was given (up to about 3 in 100). Severe problems following Tdap (Unable to perform usual activities; required medical attention)  Swelling, severe pain, bleeding and redness in the arm where the shot was given (rare). A severe allergic reaction could occur after any vaccine (estimated less than 1 in a million doses). 5. What if there is a serious reaction? What should I look for?  Look for anything that concerns you, such as signs of a severe allergic reaction, very high fever, or behavior changes. Signs of a severe allergic reaction can include hives, swelling of the face and throat, difficulty breathing, a fast heartbeat, dizziness, and weakness. These would start a few minutes to a few hours after the vaccination. What should I do?  If you think it is a severe allergic reaction or other emergency that can't wait, call 9-1-1 or get the person to the nearest hospital. Otherwise, call your doctor.  Afterward, the reaction should be reported to the "Vaccine Adverse Event Reporting System" (VAERS). Your doctor might file this report, or you can do it yourself through the VAERS web site at www.vaers.SamedayNews.es, or by calling  225-309-5893. VAERS is only for reporting reactions. They do not give medical advice.  6. The National Vaccine Injury Compensation Program The [employer] Vaccine Injury Compensation Program (VICP) is a federal program that was created to compensate people who may have been injured by certain vaccines. Persons who believe they may have been injured by a vaccine can learn about the program and about filing a claim by calling 567 541 9473 or visiting the [employer] website at GoldCloset.com.ee. 7. How can I learn more?  Ask your doctor.  Call your local or state health department.  Contact the Centers for Disease Control and Prevention (CDC):  Call (820)622-4392 or visit CDC's website at http://hunter.com/. CDC Tdap Vaccine VIS (12/22/11) Document Released: 01/31/2012 Document Revised: 12/16/2013 Document Reviewed: 11/13/2013 ExitCare Patient Information 2015 [place], [place]. This information is not intended to replace advice given to you by your health care provider. Make sure you discuss any questions you have with your health care provider.   FYI Shingles Shingles is caused by the same virus that causes chickenpox. The first feelings may be pain or tingling. A rash will  follow in a couple days. The rash may occur on any area of the body. Long-lasting pain is more likely in an elderly person. It can last months to years. There are medicines that can help prevent pain if you start taking them early. HOME CARE   Take cool baths or place cool cloths on the rash as told by your doctor.  Take medicine only as told by your doctor.  Rest as told by your doctor.  Keep your rash clean with mild soap and cool water or as told by your doctor.  Do not scratch your rash. You may use calamine lotion to relieve itchy skin as told by your doctor.  Keep your rash covered with a loose bandage (dressing).  Avoid touching:  Babies.  Pregnant women.  Children with inflamed skin  (eczema).  People who have gotten organ transplants.  People with chronic illnesses, such as leukemia or AIDS.  Wear loose-fitting clothing.  If the rash is on the face, you may need to see a specialist. Keep all appointments. Shingles must be kept away from the eyes, if possible.  Keep all follow-up visits as told by your doctor. GET HELP RIGHT AWAY IF:   You have any pain on the face or eye.  You lose feeling on one side of your face.  You have ear pain or ringing in your ear.  You cannot taste as well.  Your medicines do not help the pain.  Your redness or puffiness (swelling) spreads.  You feel like you are getting worse.  You have a fever. MAKE SURE YOU:   Understand these instructions.  Will watch your condition.  Will get help right away if you are not doing well or get worse. Document Released: 01/18/2008 Document Revised: 12/16/2013 Document Reviewed: 01/18/2008 [facility] Patient Information 2015 [place], [place]. This information is not intended to replace advice given to you by your health care provider. Make sure you discuss any questions you have with your health care provider.

## 2014-11-14 LAB — TSH: TSH: 2.69 u[IU]/mL (ref 0.350–4.500)

## 2014-11-14 LAB — INSULIN, FASTING: Insulin fasting, serum: 20.1 u[IU]/mL — ABNORMAL HIGH (ref 2.0–19.6)

## 2014-11-14 LAB — URINALYSIS, MICROSCOPIC ONLY: Casts: NONE SEEN

## 2014-11-14 LAB — MICROALBUMIN / CREATININE URINE RATIO
Creatinine, Urine: 67.9 mg/dL
MICROALB/CREAT RATIO: 38.3 mg/g — AB (ref 0.0–30.0)
Microalb, Ur: 2.6 mg/dL — ABNORMAL HIGH (ref <2.0)

## 2014-11-14 LAB — URINALYSIS, ROUTINE W REFLEX MICROSCOPIC
Bilirubin Urine: NEGATIVE
GLUCOSE, UA: NEGATIVE mg/dL
KETONES UR: NEGATIVE mg/dL
NITRITE: NEGATIVE
Protein, ur: NEGATIVE mg/dL
Specific Gravity, Urine: 1.013 (ref 1.005–1.030)
Urobilinogen, UA: 0.2 mg/dL (ref 0.0–1.0)
pH: 5 (ref 5.0–8.0)

## 2014-11-14 LAB — VITAMIN D 25 HYDROXY (VIT D DEFICIENCY, FRACTURES): Vit D, 25-Hydroxy: 44 ng/mL (ref 30–100)

## 2014-11-14 LAB — HEMOGLOBIN A1C
HEMOGLOBIN A1C: 6.7 % — AB (ref <5.7)
MEAN PLASMA GLUCOSE: 146 mg/dL — AB (ref <117)

## 2014-11-15 LAB — URINE CULTURE
Colony Count: NO GROWTH
Organism ID, Bacteria: NO GROWTH

## 2014-11-20 ENCOUNTER — Other Ambulatory Visit: Payer: Self-pay | Admitting: *Deleted

## 2014-11-20 ENCOUNTER — Other Ambulatory Visit: Payer: Self-pay | Admitting: Hematology and Oncology

## 2014-11-20 DIAGNOSIS — E2839 Other primary ovarian failure: Secondary | ICD-10-CM

## 2014-11-27 ENCOUNTER — Ambulatory Visit
Admission: RE | Admit: 2014-11-27 | Discharge: 2014-11-27 | Disposition: A | Discharge status: Home / Self Care | Payer: Medicare Other | Source: Ambulatory Visit | Attending: Emergency Medicine | Admitting: Emergency Medicine

## 2014-11-27 DIAGNOSIS — E2839 Other primary ovarian failure: Secondary | ICD-10-CM

## 2014-11-27 DIAGNOSIS — Z1231 Encounter for screening mammogram for malignant neoplasm of breast: Secondary | ICD-10-CM

## 2014-12-16 ENCOUNTER — Ambulatory Visit (INDEPENDENT_AMBULATORY_CARE_PROVIDER_SITE_OTHER): Payer: Medicare Other | Admitting: Internal Medicine

## 2014-12-16 ENCOUNTER — Encounter: Payer: Self-pay | Admitting: Internal Medicine

## 2014-12-16 VITALS — BP 122/80 | HR 96 | Temp 98.2°F | Resp 18 | Ht 63.0 in | Wt 207.0 lb

## 2014-12-16 DIAGNOSIS — R809 Proteinuria, unspecified: Secondary | ICD-10-CM

## 2014-12-16 DIAGNOSIS — R748 Abnormal levels of other serum enzymes: Secondary | ICD-10-CM

## 2014-12-16 LAB — HEPATIC FUNCTION PANEL
ALT: 17 U/L (ref 0–35)
AST: 14 U/L (ref 0–37)
Albumin: 4.1 g/dL (ref 3.5–5.2)
Alkaline Phosphatase: 144 U/L — ABNORMAL HIGH (ref 39–117)
Total Bilirubin: 0.2 mg/dL (ref 0.2–1.2)
Total Protein: 7.1 g/dL (ref 6.0–8.3)

## 2014-12-16 NOTE — Patient Instructions (Signed)
Proteinuria Proteinuria is a condition in which urine contains more protein than is normal. Proteinuria is either a sign that your body is producing too much protein or a sign that there is a problem with the kidneys. Healthy kidneys prevent most substances that the body needs, including proteins, from leaving the bloodstream and ending up in urine. CAUSES  Proteinuria may be caused by a temporary event or condition such as stress, exercise, or fever, and go away on its own. Proteinuria may also be a symptom of a more serious condition or disease. Causes of proteinuria include:  A kidney disease caused by:  Diabetes.  High blood pressure (hypertension).   A disease that affects the immune system, such as lupus.  A genetic disease, such as Alport's syndrome.  Medicines that damage the kidneys, such as long-term nonsteroidal anti-inflammatory drugs (NSAIDs).  Poisoning or exposure to toxic substances.  A reoccurring kidney or urinary infection.  Excess protein production in the body caused by:  Multiple myeloma.  Amyloidosis. SYMPTOMS You may have proteinuria without having noticeable symptoms. If there is a large amount of protein in your urine, your urine may look foamy. You may also notice swelling (edema) in your hands, feet, abdomen, or face. DIAGNOSIS To determine whether you have proteinuria, you will need to provide a urine sample. Your urine will then be tested for too much protein and the main blood protein albumin. If your test shows that you have proteinuria, you may need to take additional tests to determine its cause, how much protein is in your urine, and what type of protein is being lost. Tests may include:  Blood tests.  Urine tests.  A blood pressure measurement.  Imaging tests. TREATMENT  Treatment will depend on the cause of your proteinuria. Your caregiver will discuss treatment options with you after you have been diagnosed. If your proteinuria is mild or  temporary, no treatment may be necessary. HOME CARE INSTRUCTIONS Ask your caregiver if monitoring the level of protein in your urine at home using simple testing strips is appropriate for you. Early detection of proteinuria can lead to early and often successful treatment of the condition causing it. Document Released: 09/21/2005 Document Revised: 04/25/2012 Document Reviewed: 12/30/2011 [employer] Patient Information 2015 [employer]. This information is not intended to replace advice given to you by your health care provider. Make sure you discuss any questions you have with your health care provider.  

## 2014-12-16 NOTE — Progress Notes (Signed)
Patient ID: Sara Cross, female   DOB: 1956-03-04, 59 y.o.   MRN: [id number]  Assessment and Plan:   1. Elevated alkaline phosphatase level  - Hepatic function panel -if continued elevation consider gallbladder dysfuction  2. Proteinuria -already on lisinopril -already on metformin -Dehydration a concern as patient not drinking water - Microalbumin / creatinine urine ratio     HPI 59 y.o.female presents for 1 month follow up of elevated alk phos and mildly elevated urine protein. Patient reports that they have been doing well.  She reports that she has been weaning off of her back brace and also weaning away from her cane use.  She reports that she was instructed to drink water and has been unable to drink lots of water.  She reports that she just puts more water in her tea and has laid off of her diet sodas.  She reports that she has been eating a lot of fruit.  female is taking their medication.  They are not having difficulty with their medications.  They report no adverse reactions.  She does report that she seems to be having a really hard time with her lack of physical activity.  She reports that she wants to have her breasts reduced.  She thinks that this is leading to some of her back pain.      Past Medical History  Diagnosis Date  . Depression   . Pneumonia     hx of  . Arthritis   . Morbid obesity   . Anxiety   . Sleep apnea     wear cpap nightly  . GERD (gastroesophageal reflux disease)   . Headache(784.0)   . Dizziness   . DDD (degenerative disc disease)   . Hypertension   . Hyperlipidemia   . Diabetes mellitus     cbg fasting 170-200     Allergies  Allergen Reactions  . Benicar [Olmesartan]   . Lortab [Hydrocodone-Acetaminophen] Itching  . Ziac [Bisoprolol-Hydrochlorothiazide]       Current Outpatient Prescriptions on File Prior to Visit  Medication Sig Dispense Refill  . acetaminophen (TYLENOL) 500 MG tablet Take 1,000 mg by mouth every 6 (six) hours  as needed.    . ALPRAZolam (XANAX) 1 MG tablet TAKE ONE TABLET BY MOUTH TWICE DAILY. 60 tablet 2  . amphetamine-dextroamphetamine (ADDERALL) 20 MG tablet Take 1/2 to 1 tablet 2 or 3 x day if needed for ADD 90 tablet 0  . B Complex-C (SUPER B COMPLEX PO) Take 1 tablet by mouth daily.    Marland Kitchen buPROPion (WELLBUTRIN XL) 300 MG 24 hr tablet Take 1 tablet (300 mg total) by mouth daily. 30 tablet 2  . Cholecalciferol (VITAMIN D PO) Take 1 capsule by mouth daily.    . cyclobenzaprine (FLEXERIL) 10 MG tablet Take 1 tablet (10 mg total) by mouth at bedtime. 90 tablet 0  . Fluticasone Furoate-Vilanterol (BREO ELLIPTA) 100-25 MCG/INH AEPB 1 puff daily for breathing 60 each 5  . furosemide (LASIX) 20 MG tablet TAKE TWO TABLETS BY MOUTH ONCE DAILY 180 tablet 0  . ibuprofen (ADVIL,MOTRIN) 200 MG tablet Take 800 mg by mouth every 4 (four) hours as needed.    Marland Kitchen lisinopril (PRINIVIL,ZESTRIL) 40 MG tablet TAKE ONE TABLET BY MOUTH ONCE DAILY 30 tablet 5  . MAGNESIUM PO Take 400 mg by mouth daily.     Marland Kitchen omeprazole (PRILOSEC) 20 MG capsule Take 1 capsule (20 mg total) by mouth daily. (Patient taking differently: Take 20 mg by mouth  daily as needed. ) 90 capsule 1  . oxyCODONE-acetaminophen (PERCOCET/ROXICET) 5-325 MG per tablet Take 1 tablet by mouth every 6 (six) hours as needed for pain. 60 tablet 0  . VITAMIN A EX Apply topically daily.    . vitamin E (VITAMIN E) 400 UNIT capsule Take 800 Units by mouth daily.    . metFORMIN (GLUCOPHAGE-XR) 500 MG 24 hr tablet TAKE TWO TABLETS BY MOUTH TWICE DAILY 360 tablet 5   No current facility-administered medications on file prior to visit.    Review of Systems  Constitutional: Positive for malaise/fatigue. Negative for fever and chills.  Respiratory: Negative for cough, shortness of breath and wheezing.   Cardiovascular: Negative for chest pain, palpitations and leg swelling.  Gastrointestinal: Negative for heartburn, nausea, vomiting, diarrhea, constipation, blood in  stool and melena.  Genitourinary: Negative for dysuria, urgency and frequency.  Psychiatric/Behavioral: Positive for depression. The patient is nervous/anxious. The patient does not have insomnia.      Physical Exam: Filed Weights   12/16/14 0841  Weight: 207 lb (93.895 kg)   BP 122/80 mmHg  Pulse 96  Temp(Src) 98.2 F (36.8 C) (Temporal)  Resp 18  Ht 5' 3"  (1.6 m)  Wt 207 lb (93.895 kg)  BMI 36.68 kg/m2 General Appearance: Well developed well nourished, non-toxic appearing in no apparent distress. Eyes: PERRLA, EOMs, conjunctiva w/ no swelling or erythema or discharge Sinuses: No Frontal/maxillary tenderness ENT/Mouth: Ear canals clear without swelling or erythema.  TM's normal bilaterally with no retractions, bulging, or loss of landmarks.   Neck: Supple, thyroid normal, no notable JVD  Respiratory: Respiratory effort normal, Clear breath sounds anteriorly and posteriorly bilaterally without rales, rhonchi, wheezing or stridor. No retractions or accessory muscle usage. Cardio: RRR with no MRGs.   Abdomen: Soft, + BS.  Non tender, no guarding, rebound, hernias, masses.  Musculoskeletal: Back brace in place, Full ROM, 5/5 strength, normal gait.  Skin: Warm, dry without rashes  Neuro: Awake and oriented X 3, Cranial nerves intact. Normal muscle tone, no cerebellar symptoms. Sensation intact.  Psych: normal affect, Insight and Judgment appropriate.     FORCUCCI, Asmara Backs, PA-C 8:53 AM [facility]

## 2014-12-17 LAB — MICROALBUMIN / CREATININE URINE RATIO
Creatinine, Urine: 132.5 mg/dL
MICROALB/CREAT RATIO: 4.5 mg/g (ref 0.0–30.0)
Microalb, Ur: 0.6 mg/dL (ref <2.0)

## 2014-12-30 ENCOUNTER — Other Ambulatory Visit: Payer: Self-pay | Admitting: Emergency Medicine

## 2014-12-30 ENCOUNTER — Other Ambulatory Visit: Payer: Self-pay | Admitting: Physician Assistant

## 2015-01-21 ENCOUNTER — Other Ambulatory Visit: Payer: Self-pay | Admitting: Neurosurgery

## 2015-01-21 DIAGNOSIS — M5136 Other intervertebral disc degeneration, lumbar region: Secondary | ICD-10-CM

## 2015-01-22 ENCOUNTER — Other Ambulatory Visit: Payer: Medicare Other

## 2015-01-22 ENCOUNTER — Inpatient Hospital Stay: Admission: RE | Admit: 2015-01-22 | Payer: Medicare Other | Source: Ambulatory Visit

## 2015-01-22 ENCOUNTER — Ambulatory Visit
Admission: RE | Admit: 2015-01-22 | Discharge: 2015-01-22 | Disposition: A | Discharge status: Home / Self Care | Payer: Medicare Other | Source: Ambulatory Visit | Attending: Neurosurgery | Admitting: Neurosurgery

## 2015-01-22 DIAGNOSIS — M5136 Other intervertebral disc degeneration, lumbar region: Secondary | ICD-10-CM

## 2015-01-26 ENCOUNTER — Other Ambulatory Visit: Payer: Self-pay | Admitting: Internal Medicine

## 2015-01-26 ENCOUNTER — Encounter: Payer: Self-pay | Admitting: Internal Medicine

## 2015-01-26 DIAGNOSIS — F988 Other specified behavioral and emotional disorders with onset usually occurring in childhood and adolescence: Secondary | ICD-10-CM

## 2015-01-26 MED ORDER — AMPHETAMINE-DEXTROAMPHETAMINE 20 MG PO TABS: ORAL_TABLET | ORAL | Status: DC

## 2015-02-03 ENCOUNTER — Other Ambulatory Visit: Payer: Self-pay | Admitting: Internal Medicine

## 2015-03-09 ENCOUNTER — Other Ambulatory Visit: Payer: Self-pay | Admitting: Internal Medicine

## 2015-03-10 ENCOUNTER — Ambulatory Visit: Payer: Self-pay | Admitting: Internal Medicine

## 2015-03-26 ENCOUNTER — Ambulatory Visit: Payer: Self-pay | Admitting: Internal Medicine

## 2015-03-27 ENCOUNTER — Ambulatory Visit: Payer: Self-pay | Admitting: Internal Medicine

## 2015-03-31 ENCOUNTER — Other Ambulatory Visit: Payer: Self-pay | Admitting: Neurosurgery

## 2015-03-31 ENCOUNTER — Other Ambulatory Visit: Payer: Self-pay | Admitting: Physician Assistant

## 2015-03-31 DIAGNOSIS — M5137 Other intervertebral disc degeneration, lumbosacral region: Secondary | ICD-10-CM

## 2015-03-31 DIAGNOSIS — F988 Other specified behavioral and emotional disorders with onset usually occurring in childhood and adolescence: Secondary | ICD-10-CM

## 2015-03-31 DIAGNOSIS — M5021 Other cervical disc displacement,  high cervical region: Secondary | ICD-10-CM

## 2015-03-31 MED ORDER — AMPHETAMINE-DEXTROAMPHETAMINE 20 MG PO TABS: ORAL_TABLET | ORAL | Status: DC

## 2015-04-01 ENCOUNTER — Other Ambulatory Visit: Payer: Self-pay | Admitting: Internal Medicine

## 2015-04-03 ENCOUNTER — Ambulatory Visit
Admission: RE | Admit: 2015-04-03 | Discharge: 2015-04-03 | Disposition: A | Discharge status: Home / Self Care | Payer: Medicare Other | Source: Ambulatory Visit | Attending: Neurosurgery | Admitting: Neurosurgery

## 2015-04-03 DIAGNOSIS — M5137 Other intervertebral disc degeneration, lumbosacral region: Secondary | ICD-10-CM

## 2015-04-03 DIAGNOSIS — M5021 Other cervical disc displacement,  high cervical region: Secondary | ICD-10-CM

## 2015-04-14 ENCOUNTER — Ambulatory Visit (INDEPENDENT_AMBULATORY_CARE_PROVIDER_SITE_OTHER): Payer: Medicare Other | Admitting: Internal Medicine

## 2015-04-14 ENCOUNTER — Encounter: Payer: Self-pay | Admitting: Internal Medicine

## 2015-04-14 ENCOUNTER — Other Ambulatory Visit: Payer: Self-pay | Admitting: *Deleted

## 2015-04-14 VITALS — BP 126/78 | HR 80 | Temp 97.1°F | Resp 16 | Ht 62.0 in | Wt 195.6 lb

## 2015-04-14 DIAGNOSIS — E1140 Type 2 diabetes mellitus with diabetic neuropathy, unspecified: Secondary | ICD-10-CM | POA: Diagnosis not present

## 2015-04-14 DIAGNOSIS — Z6835 Body mass index (BMI) 35.0-35.9, adult: Secondary | ICD-10-CM

## 2015-04-14 DIAGNOSIS — E1149 Type 2 diabetes mellitus with other diabetic neurological complication: Secondary | ICD-10-CM

## 2015-04-14 DIAGNOSIS — M8580 Other specified disorders of bone density and structure, unspecified site: Secondary | ICD-10-CM

## 2015-04-14 DIAGNOSIS — E559 Vitamin D deficiency, unspecified: Secondary | ICD-10-CM

## 2015-04-14 DIAGNOSIS — I10 Essential (primary) hypertension: Secondary | ICD-10-CM

## 2015-04-14 DIAGNOSIS — E782 Mixed hyperlipidemia: Secondary | ICD-10-CM | POA: Diagnosis not present

## 2015-04-14 DIAGNOSIS — Z79899 Other long term (current) drug therapy: Secondary | ICD-10-CM

## 2015-04-14 LAB — LIPID PANEL
CHOLESTEROL: 158 mg/dL (ref 125–200)
HDL: 51 mg/dL (ref >=46)
LDL Cholesterol: 43 mg/dL (ref <130)
TRIGLYCERIDES: 319 mg/dL — AB (ref <150)
Total CHOL/HDL Ratio: 3.1 Ratio (ref <=5.0)
VLDL: 64 mg/dL — AB (ref <30)

## 2015-04-14 LAB — CBC WITH DIFFERENTIAL/PLATELET
BASOS PCT: 0 % (ref 0–1)
Basophils Absolute: 0 10*3/uL (ref 0.0–0.1)
EOS PCT: 3 % (ref 0–5)
Eosinophils Absolute: 0.2 10*3/uL (ref 0.0–0.7)
HEMATOCRIT: 39.6 % (ref 36.0–46.0)
HEMOGLOBIN: 13.4 g/dL (ref 12.0–15.0)
LYMPHS PCT: 24 % (ref 12–46)
Lymphs Abs: 1.6 10*3/uL (ref 0.7–4.0)
MCH: 29.1 pg (ref 26.0–34.0)
MCHC: 33.8 g/dL (ref 30.0–36.0)
MCV: 86.1 fL (ref 78.0–100.0)
MONO ABS: 0.5 10*3/uL (ref 0.1–1.0)
MONOS PCT: 8 % (ref 3–12)
MPV: 10.3 fL (ref 8.6–12.4)
NEUTROS ABS: 4.4 10*3/uL (ref 1.7–7.7)
Neutrophils Relative %: 65 % (ref 43–77)
Platelets: 276 10*3/uL (ref 150–400)
RBC: 4.6 MIL/uL (ref 3.87–5.11)
RDW: 16.1 % — AB (ref 11.5–15.5)
WBC: 6.7 10*3/uL (ref 4.0–10.5)

## 2015-04-14 LAB — BASIC METABOLIC PANEL WITH GFR
BUN: 12 mg/dL (ref 7–25)
CALCIUM: 10.1 mg/dL (ref 8.6–10.4)
CO2: 29 mmol/L (ref 20–31)
CREATININE: 0.65 mg/dL (ref 0.50–1.05)
Chloride: 99 mmol/L (ref 98–110)
GLUCOSE: 153 mg/dL — AB (ref 65–99)
Potassium: 4.7 mmol/L (ref 3.5–5.3)
Sodium: 138 mmol/L (ref 135–146)

## 2015-04-14 LAB — HEPATIC FUNCTION PANEL
ALBUMIN: 4.2 g/dL (ref 3.6–5.1)
ALT: 19 U/L (ref 6–29)
AST: 15 U/L (ref 10–35)
Alkaline Phosphatase: 114 U/L (ref 33–130)
Bilirubin, Direct: 0.1 mg/dL (ref <=0.2)
Indirect Bilirubin: 0.2 mg/dL (ref 0.2–1.2)
TOTAL PROTEIN: 6.9 g/dL (ref 6.1–8.1)
Total Bilirubin: 0.3 mg/dL (ref 0.2–1.2)

## 2015-04-14 LAB — MAGNESIUM: MAGNESIUM: 2 mg/dL (ref 1.5–2.5)

## 2015-04-14 LAB — TSH: TSH: 1.52 u[IU]/mL (ref 0.350–4.500)

## 2015-04-14 MED ORDER — ALENDRONATE SODIUM 70 MG PO TABS: 70.0000 mg | ORAL_TABLET | ORAL | Status: DC

## 2015-04-14 MED ORDER — OMEPRAZOLE 20 MG PO CPDR: 20.0000 mg | DELAYED_RELEASE_CAPSULE | Freq: Every day | ORAL | Status: DC

## 2015-04-14 NOTE — Patient Instructions (Signed)
Osteoporosis Throughout your life, your body breaks down old bone and replaces it with new bone. As you get older, your body does not replace bone as quickly as it breaks it down. By the age of 43 years, most people begin to gradually lose bone because of the imbalance between bone loss and replacement. Some people lose more bone than others. Bone loss beyond a specified normal degree is considered osteoporosis.  Osteoporosis affects the strength and durability of your bones. The inside of the ends of your bones and your flat bones, like the bones of your pelvis, look like honeycomb, filled with tiny open spaces. As bone loss occurs, your bones become less dense. This means that the open spaces inside your bones become bigger and the walls between these spaces become thinner. This makes your bones weaker. Bones of a person with osteoporosis can become so weak that they can break (fracture) during minor accidents, such as a simple fall. CAUSES  The following factors have been associated with the development of osteoporosis:  Smoking.  Drinking more than 2 alcoholic drinks several days per week.  Long-term use of certain medicines:  Corticosteroids.  Chemotherapy medicines.  Thyroid medicines.  Antiepileptic medicines.  Gonadal hormone suppression medicine.  Immunosuppression medicine.  Being underweight.  Lack of physical activity.  Lack of exposure to the sun. This can lead to vitamin D deficiency.  Certain medical conditions:  Certain inflammatory bowel diseases, such as Crohn disease and ulcerative colitis.  Diabetes.  Hyperthyroidism.  Hyperparathyroidism. RISK FACTORS Anyone can develop osteoporosis. However, the following factors can increase your risk of developing osteoporosis:  Gender--Women are at higher risk than men.  Age--Being older than 50 years increases your risk.  Ethnicity--White and Asian people have an increased risk.  Weight --Being extremely  underweight can increase your risk of osteoporosis.  Family history of osteoporosis--Having a family member who has developed osteoporosis can increase your risk. SYMPTOMS  Usually, people with osteoporosis have no symptoms.  DIAGNOSIS  Signs during a physical exam that may prompt your caregiver to suspect osteoporosis include:  Decreased height. This is usually caused by the compression of the bones that form your spine (vertebrae) because they have weakened and become fractured.  A curving or rounding of the upper back (kyphosis). To confirm signs of osteoporosis, your caregiver may request a procedure that uses 2 low-dose X-ray beams with different levels of energy to measure your bone mineral density (dual-energy X-ray absorptiometry [DXA]). Also, your caregiver may check your level of vitamin D. TREATMENT  The goal of osteoporosis treatment is to strengthen bones in order to decrease the risk of bone fractures. There are different types of medicines available to help achieve this goal. Some of these medicines work by slowing the processes of bone loss. Some medicines work by increasing bone density. Treatment also involves making sure that your levels of calcium and vitamin D are adequate. PREVENTION  There are things you can do to help prevent osteoporosis. Adequate intake of calcium and vitamin D can help you achieve optimal bone mineral density. Regular exercise can also help, especially resistance and weight-bearing activities. If you smoke, quitting smoking is an important part of osteoporosis prevention. MAKE SURE YOU:  Understand these instructions.  Will watch your condition.  Will get help right away if you are not doing well or get worse. FOR MORE INFORMATION www.osteo.org and [employer].com.ee Document Released: 05/11/2005 Document Revised: 11/26/2012 Document Reviewed: 07/16/2011 [facility] Patient Information 2015 [place], [place]. This information  is not intended to replace advice  given to you by your health care provider. Make sure you discuss any questions you have with your health care provider.  ++++++++++++++++++++++++++++++++++++++++ Recommend Adult Low dose Aspirin or   coated  Aspirin 81 mg daily   To reduce risk of Colon Cancer 20 %,   Skin Cancer 26 % ,   Melanoma 46%   and   Pancreatic cancer 60%  ++++++++++++++++++  Vitamin D goal   is between 70-100.   Please make sure that you are taking your Vitamin D as directed.   It is very important as a natural anti-inflammatory   helping hair, skin, and nails, as well as reducing stroke and heart attack risk.   It helps your bones and helps with mood.  It also decreases numerous cancer risks so please take it as directed.   Low Vit D is associated with a 200-300% higher risk for CANCER   and 200-300% higher risk for HEART   ATTACK  &  STROKE.   .....................................Marland Kitchen  It is also associated with higher death rate at younger ages,   autoimmune diseases like Rheumatoid arthritis, Lupus, Multiple Sclerosis.     Also many other serious conditions, like depression, Alzheimer's  Dementia, infertility, muscle aches, fatigue, fibromyalgia - just to name a few.  +++++++++++++++++++  Recommend the book "The END of DIETING" by Dr Excell Seltzer   & the book "The END of DIABETES " by Dr Excell Seltzer  At [facility].com - get book & Audio CD's     Being diabetic has a  300% increased risk for heart attack, stroke, cancer, and alzheimer- type vascular dementia. It is very important that you work harder with diet by avoiding all foods that are white. Avoid white rice (brown & wild rice is OK), white potatoes (sweetpotatoes in moderation is OK), White bread or wheat bread or anything made out of white flour like bagels, donuts, rolls, buns, biscuits, cakes, pastries, cookies, pizza crust, and pasta (made from white flour & egg whites) - vegetarian pasta or spinach or wheat pasta is OK.  Multigrain breads like Arnold's or Pepper[employer], or multigrain sandwich thins or flatbreads.  Diet, exercise and weight loss can reverse and cure diabetes in the early stages.  Diet, exercise and weight loss is very important in the control and prevention of complications of diabetes which affects every system in your body, ie. Brain - dementia/stroke, eyes - glaucoma/blindness, heart - heart attack/heart failure, kidneys - dialysis, stomach - gastric paralysis, intestines - malabsorption, nerves - severe painful neuritis, circulation - gangrene & loss of a leg(s), and finally cancer and Alzheimers.    I recommend avoid fried & greasy foods,  sweets/candy, white rice (brown or wild rice or Quinoa is OK), white potatoes (sweet potatoes are OK) - anything made from white flour - bagels, doughnuts, rolls, buns, biscuits,white and wheat breads, pizza crust and traditional pasta made of white flour & egg white(vegetarian pasta or spinach or wheat pasta is OK).  Multi-grain bread is OK - like multi-grain flat bread or sandwich thins. Avoid alcohol in excess. Exercise is also important.    Eat all the vegetables you want - avoid meat, especially red meat and dairy - especially cheese.  Cheese is the most concentrated form of trans-fats which is the worst thing to clog up our arteries. Veggie cheese is OK which can be found in the fresh produce section at [facility] or [employer] or [employer]  ++++++++++++++++++++++++++

## 2015-04-14 NOTE — Progress Notes (Signed)
Patient ID: Sara Cross, female   DOB: [birth year], 59 y.o.   MRN: [id number]   This very nice 59 y.o. MWF presents for  follow up with Hypertension, Hyperlipidemia, T2_NIDDM and Vitamin D Deficiency. Patient relarele hx/o spinal surgery every year since 2012 with neck surg x 2 & lumbar surg x2  With lat lumbar surgery in Jan by Dr Saintclair Halsted for fusion and reportedly the "screws" did not hold. Patient had a BMD in Apr 2016 showing osteopenia. Patient reports she is scheduled for breast reduction surg by Dr Harlow Mares next week in anticipation that it will help with her neck & low back pains. Thereafter she later is tentatively scheduled for lumbar fusion and Dr Saintclair Halsted has requested she be considered for bisphosphonate therapy in that she has has poor bone healing with the last surgical screws not fixating. Patient reports daily chronic disabling pain for which she usually take 2-3 percocet  tabs /daily .    Patient is treated for HTN & BP has been controlled at home. Today's BP: 126/78 mmHg. Patient has had no complaints of any cardiac type chest pain, palpitations, dyspnea/orthopnea/PND, dizziness, claudication, or dependent edema.   Hyperlipidemia is not controlled with diet & meds. Patient denies myalgias or other med SE's. Last Lipids were not at goal - Cholesterol 211*; HDL 55; LDL 116*; and elevated Triglycerides 202 on 11/13/2014.   Also, the patient has history of T2_NIDDM and has had no symptoms of reactive hypoglycemia, diabetic polys, paresthesias or visual blurring.  Last A1c was  6.7% on 11/13/2014.   Further, the patient also has history of Vitamin D Deficiency and supplements vitamin D without any suspected side-effects. Last vitamin D was 44 on 11/13/2014.     Medication Sig  . ALPRAZolam (XANAX) 1 MG tablet TAKE ONE TABLET BY MOUTH TWICE DAILY  . ADDERALL 20 MG tablet Take 1/2 to 1 tablet 2 or 3 x day if needed for ADD  . SUPER B COMPLEX  Take 1 tablet by mouth daily.  Marland Kitchen buPROPion  XL 300 MG  TAKE  ONE TABLET BY MOUTH ONCE DAILY  . VITAMIN D  Take 5,000 Units by mouth daily.   . Furosemide 20 MG tablet TAKE TWO TABLETS BY MOUTH ONCE DAILY  . lisinopril  40 MG tablet TAKE ONE TABLET BY MOUTH ONCE DAILY  . MAGNESIUM PO Take 400 mg by mouth daily.   Marland Kitchen PERCOCET 5-325 MG  Take 1 tablet by mouth every 6 (six) hours as needed for pain.  . vitamin E (400 UNIT capsule Take 800 Units by mouth daily.  Marland Kitchen omeprazole  20 MG capsule Take 1 capsule (20 mg total) by mouth daily. (Patient taking differently: Take 20 mg by mouth daily as needed. )  . metFORMIN -XR 500 MG TAKE TWO TABLETS BY MOUTH TWICE DAILY  . acetaminophen  500 MG tablet Take 1,000 mg by mouth every 6 (six) hours as needed.  . cyclobenzaprine  10 MG tablet Take 1 tablet (10 mg total) by mouth at bedtime.  Marland Kitchen BREO ELLIPTA 100-25 1 puff daily for breathing  . furosemide  20 MG tablet TAKE TWO TABLETS BY MOUTH ONCE DAILY  . ibuprofen  200 MG tablet Take 800 mg by mouth every 4 (four) hours as needed.  Marland Kitchen VITAMIN A EX Apply topically daily.   Allergies  Allergen Reactions  . Benicar [Olmesartan]   . Lortab [Hydrocodone-Acetaminophen] Itching  . Ziac [Bisoprolol-Hydrochlorothiazide]    PMHx:   Past Medical History  Diagnosis  Date  . Depression   . Pneumonia     hx of  . Arthritis   . Morbid obesity   . Anxiety   . Sleep apnea     wear cpap nightly  . GERD (gastroesophageal reflux disease)   . Headache(784.0)   . Dizziness   . DDD (degenerative disc disease)   . Hypertension   . Hyperlipidemia   . Diabetes mellitus     cbg fasting 170-200   Immunization History  Administered Date(s) Administered  . Influenza Split 06/06/2014  . Influenza Whole 05/15/2013  . Pneumococcal Polysaccharide-23 10/25/2013  . Td 07/05/2004  . Tdap 11/13/2014   Past Surgical History  Procedure Laterality Date  . Back surgery      01/2011  . Hand surgery      right   2011    FOR INJURY   . Cesearean      X2  . Toenail excision      BIG  TOE RIGHT FOOT  . Toe nail removal      right great toe  . Abdominal hysterectomy  2004  . Colonoscopy    . Anterior cervical decomp/discectomy fusion  12/23/2011    Procedure: ANTERIOR CERVICAL DECOMPRESSION/DISCECTOMY FUSION 2 LEVELS;  Surgeon: Elaina Hoops, MD;  Location: [place] [facility];  Service: Neurosurgery;  Laterality: N/A;  Anterior Cervical Four-Five/Five-Six Decompression and Fusion  . Carpal tunnel release  12/23/2011    Procedure: CARPAL TUNNEL RELEASE;  Surgeon: Elaina Hoops, MD;  Location: [place] [facility];  Service: Neurosurgery;  Laterality: Left;  Left Carpal Tunnel Release  . Anterior cervical decomp/discectomy fusion N/A 06/07/2013    Procedure: ANTERIOR CERVICAL DECOMPRESSION/DISCECTOMY FUSION 1 LEVEL/HARDWARE REMOVAL;  Surgeon: Elaina Hoops, MD;  Location: [place] [facility];  Service: Neurosurgery;  Laterality: N/A;  ANTERIOR CERVICAL DECOMPRESSION/DISCECTOMY FUSION 1 LEVEL/HARDWARE REMOVAL  . Lumbar laminectomy/decompression microdiscectomy N/A 08/29/2014    Procedure: Thoracic seven-eight  microdiskectomy;  Surgeon: Elaina Hoops, MD;  Location: [place] [facility];  Service: Neurosurgery;  Laterality: N/A;   FHx:    Reviewed / unchanged  SHx:    Reviewed / unchanged  Systems Review:  Constitutional: Denies fever, chills, wt changes, headaches, insomnia, fatigue, night sweats, change in appetite. Eyes: Denies redness, blurred vision, diplopia, discharge, itchy, watery eyes.  ENT: Denies discharge, congestion, post nasal drip, epistaxis, sore throat, earache, hearing loss, dental pain, tinnitus, vertigo, sinus pain, snoring.  CV: Denies chest pain, palpitations, irregular heartbeat, syncope, dyspnea, diaphoresis, orthopnea, PND, claudication or edema. Respiratory: denies cough, dyspnea, DOE, pleurisy, hoarseness, laryngitis, wheezing.  Gastrointestinal: Denies dysphagia, odynophagia, heartburn, reflux, water brash, abdominal pain or cramps, nausea, vomiting, bloating, diarrhea, constipation,  hematemesis, melena, hematochezia  or hemorrhoids. Genitourinary: Denies dysuria, frequency, urgency, nocturia, hesitancy, discharge, hematuria or flank pain. Musculoskeletal: Denies arthralgias, myalgias, stiffness, jt. swelling, pain, limping or strain/sprain.  Skin: Denies pruritus, rash, hives, warts, acne, eczema or change in skin lesion(s). Neuro: No weakness, tremor, incoordination, spasms, paresthesia or pain. Psychiatric: Denies confusion, memory loss or sensory loss. Endo: Denies change in weight, skin or hair change.  Heme/Lymph: No excessive bleeding, bruising or enlarged lymph nodes.  Physical Exam  BP 126/78 mmHg  Pulse 80  Temp(Src) 97.1 F (36.2 C)  Resp 16  Ht 5\' 2"  (1.575 m)  Wt 195 lb 9.6 oz (88.724 kg)  BMI 35.77 kg/m2  Appears well nourished and in no distress. Eyes: PERRLA, EOMs, conjunctiva no swelling or erythema. Sinuses: No frontal/maxillary tenderness ENT/Mouth: EAC's clear, TM's nl  w/o erythema, bulging. Nares clear w/o erythema, swelling, exudates. Oropharynx clear without erythema or exudates. Oral hygiene is good. Tongue normal, non obstructing. Hearing intact.  Neck: Supple. Thyroid nl. Car 2+/2+ without bruits, nodes or JVD. Chest: Respirations nl with BS clear & equal w/o rales, rhonchi, wheezing or stridor.  Cor: Heart sounds normal w/ regular rate and rhythm without sig. murmurs, gallops, clicks, or rubs. Peripheral pulses normal and equal  without edema.  Abdomen: Soft & bowel sounds normal. Non-tender w/o guarding, rebound, hernias, masses, or organomegaly.  Lymphatics: Unremarkable.  Musculoskeletal: Full ROM all peripheral extremities, joint stability, 5/5 strength, and normal gait.  Skin: Warm, dry without exposed rashes, lesions or ecchymosis apparent.  Neuro: Cranial nerves intact, reflexes equal bilaterally. Sensory-motor testing grossly intact. Tendon reflexes are flat in the lower extremities.  Pysch: Alert & oriented x 3.  Insight and  judgement nl & appropriate. No ideations.  Assessment and Plan:  1. Essential hypertension  - TSH  2. Hyperlipidemia  - Lipid panel  3. Diabetes mellitus type 2 with neurological manifestations  - Hemoglobin A1c - Insulin, random  4. Vitamin D deficiency  - Vit D  25 hydroxy   5. Osteopenia  - reports are reviewed with patient & explained that she does not actually have osteoporosis, but that it may be reasonable to try taking Fosamax to aid with bone formation/healing and she understands that this usage is off label, but still wishes to try based on Dr Windy Carina optimism..   6. Encounter for long-term (current) use of medications  - CBC with Differential/Platelet - BASIC METABOLIC PANEL WITH GFR - Hepatic function panel - Magnesium  7. Morbid obesity (BMI 35.77)   8. BMI 35.0-35.9,adult   Recommended regular exercise, BP monitoring, weight control, and discussed med and SE's. Recommended labs to assess and monitor clinical status. Further disposition pending results of labs. Over 30 minutes of exam, counseling, chart review was performed

## 2015-04-15 LAB — VITAMIN D 25 HYDROXY (VIT D DEFICIENCY, FRACTURES): Vit D, 25-Hydroxy: 57 ng/mL (ref 30–100)

## 2015-04-15 LAB — HEMOGLOBIN A1C
HEMOGLOBIN A1C: 6.5 % — AB (ref <5.7)
Mean Plasma Glucose: 140 mg/dL — ABNORMAL HIGH (ref <117)

## 2015-04-15 LAB — INSULIN, RANDOM: Insulin: 26.7 u[IU]/mL — ABNORMAL HIGH (ref 2.0–19.6)

## 2015-04-16 HISTORY — PX: BREAST REDUCTION SURGERY: SHX8

## 2015-04-21 ENCOUNTER — Other Ambulatory Visit: Payer: Self-pay | Admitting: Plastic Surgery

## 2015-05-01 ENCOUNTER — Ambulatory Visit: Payer: Self-pay | Admitting: Internal Medicine

## 2015-05-07 ENCOUNTER — Other Ambulatory Visit: Payer: Self-pay | Admitting: Physician Assistant

## 2015-05-12 ENCOUNTER — Other Ambulatory Visit: Payer: Self-pay | Admitting: Internal Medicine

## 2015-05-12 MED ORDER — ALPRAZOLAM 1 MG PO TABS: 1.0000 mg | ORAL_TABLET | Freq: Two times a day (BID) | ORAL | Status: DC

## 2015-06-02 ENCOUNTER — Other Ambulatory Visit: Payer: Self-pay | Admitting: *Deleted

## 2015-06-03 ENCOUNTER — Other Ambulatory Visit: Payer: Self-pay | Admitting: Physician Assistant

## 2015-06-03 ENCOUNTER — Other Ambulatory Visit (HOSPITAL_COMMUNITY): Payer: Self-pay | Admitting: Neurosurgery

## 2015-06-03 DIAGNOSIS — F988 Other specified behavioral and emotional disorders with onset usually occurring in childhood and adolescence: Secondary | ICD-10-CM

## 2015-06-03 MED ORDER — AMPHETAMINE-DEXTROAMPHETAMINE 20 MG PO TABS: ORAL_TABLET | ORAL | Status: DC

## 2015-06-07 ENCOUNTER — Emergency Department (HOSPITAL_COMMUNITY): Payer: No Typology Code available for payment source

## 2015-06-07 ENCOUNTER — Emergency Department (HOSPITAL_COMMUNITY)
Admission: EM | Admit: 2015-06-07 | Discharge: 2015-06-08 | Disposition: A | Discharge status: Home / Self Care | Payer: No Typology Code available for payment source | Attending: Emergency Medicine | Admitting: Emergency Medicine

## 2015-06-07 ENCOUNTER — Other Ambulatory Visit: Payer: Self-pay

## 2015-06-07 ENCOUNTER — Encounter (HOSPITAL_COMMUNITY): Payer: Self-pay | Admitting: *Deleted

## 2015-06-07 DIAGNOSIS — Y92410 Unspecified street and highway as the place of occurrence of the external cause: Secondary | ICD-10-CM | POA: Insufficient documentation

## 2015-06-07 DIAGNOSIS — F1310 Sedative, hypnotic or anxiolytic abuse, uncomplicated: Secondary | ICD-10-CM | POA: Insufficient documentation

## 2015-06-07 DIAGNOSIS — F1110 Opioid abuse, uncomplicated: Secondary | ICD-10-CM | POA: Insufficient documentation

## 2015-06-07 DIAGNOSIS — F1510 Other stimulant abuse, uncomplicated: Secondary | ICD-10-CM | POA: Insufficient documentation

## 2015-06-07 DIAGNOSIS — E785 Hyperlipidemia, unspecified: Secondary | ICD-10-CM | POA: Insufficient documentation

## 2015-06-07 DIAGNOSIS — G4730 Sleep apnea, unspecified: Secondary | ICD-10-CM | POA: Insufficient documentation

## 2015-06-07 DIAGNOSIS — Y9389 Activity, other specified: Secondary | ICD-10-CM | POA: Insufficient documentation

## 2015-06-07 DIAGNOSIS — I10 Essential (primary) hypertension: Secondary | ICD-10-CM | POA: Insufficient documentation

## 2015-06-07 DIAGNOSIS — E119 Type 2 diabetes mellitus without complications: Secondary | ICD-10-CM | POA: Insufficient documentation

## 2015-06-07 DIAGNOSIS — K219 Gastro-esophageal reflux disease without esophagitis: Secondary | ICD-10-CM | POA: Insufficient documentation

## 2015-06-07 DIAGNOSIS — S199XXA Unspecified injury of neck, initial encounter: Secondary | ICD-10-CM | POA: Insufficient documentation

## 2015-06-07 DIAGNOSIS — S0083XA Contusion of other part of head, initial encounter: Secondary | ICD-10-CM | POA: Insufficient documentation

## 2015-06-07 DIAGNOSIS — Z87891 Personal history of nicotine dependence: Secondary | ICD-10-CM | POA: Insufficient documentation

## 2015-06-07 DIAGNOSIS — Y998 Other external cause status: Secondary | ICD-10-CM | POA: Insufficient documentation

## 2015-06-07 DIAGNOSIS — S3992XA Unspecified injury of lower back, initial encounter: Secondary | ICD-10-CM | POA: Insufficient documentation

## 2015-06-07 DIAGNOSIS — F329 Major depressive disorder, single episode, unspecified: Secondary | ICD-10-CM | POA: Insufficient documentation

## 2015-06-07 DIAGNOSIS — F419 Anxiety disorder, unspecified: Secondary | ICD-10-CM | POA: Insufficient documentation

## 2015-06-07 DIAGNOSIS — Z8701 Personal history of pneumonia (recurrent): Secondary | ICD-10-CM | POA: Insufficient documentation

## 2015-06-07 LAB — RAPID URINE DRUG SCREEN, HOSP PERFORMED
Amphetamines: POSITIVE — AB
Barbiturates: NOT DETECTED
Benzodiazepines: POSITIVE — AB
COCAINE: NOT DETECTED
OPIATES: POSITIVE — AB
Tetrahydrocannabinol: NOT DETECTED

## 2015-06-07 LAB — URINALYSIS, ROUTINE W REFLEX MICROSCOPIC
Bilirubin Urine: NEGATIVE
Glucose, UA: NEGATIVE mg/dL
Ketones, ur: NEGATIVE mg/dL
Leukocytes, UA: NEGATIVE
NITRITE: NEGATIVE
PH: 5 (ref 5.0–8.0)
PROTEIN: NEGATIVE mg/dL
SPECIFIC GRAVITY, URINE: 1.029 (ref 1.005–1.030)
UROBILINOGEN UA: 0.2 mg/dL (ref 0.0–1.0)

## 2015-06-07 LAB — URINE MICROSCOPIC-ADD ON

## 2015-06-07 LAB — COMPREHENSIVE METABOLIC PANEL
ALBUMIN: 3.8 g/dL (ref 3.5–5.0)
ALT: 47 U/L (ref 14–54)
ANION GAP: 10 (ref 5–15)
AST: 68 U/L — AB (ref 15–41)
Alkaline Phosphatase: 87 U/L (ref 38–126)
BILIRUBIN TOTAL: 0.3 mg/dL (ref 0.3–1.2)
BUN: 17 mg/dL (ref 6–20)
CHLORIDE: 99 mmol/L — AB (ref 101–111)
CO2: 23 mmol/L (ref 22–32)
Calcium: 9.6 mg/dL (ref 8.9–10.3)
Creatinine, Ser: 0.96 mg/dL (ref 0.44–1.00)
GFR calc Af Amer: >60 mL/min (ref >60)
GFR calc non Af Amer: >60 mL/min (ref >60)
GLUCOSE: 146 mg/dL — AB (ref 65–99)
POTASSIUM: 4.3 mmol/L (ref 3.5–5.1)
Sodium: 132 mmol/L — ABNORMAL LOW (ref 135–145)
TOTAL PROTEIN: 6.6 g/dL (ref 6.5–8.1)

## 2015-06-07 LAB — CBC
HEMATOCRIT: 39.4 % (ref 36.0–46.0)
HEMOGLOBIN: 13.1 g/dL (ref 12.0–15.0)
MCH: 29 pg (ref 26.0–34.0)
MCHC: 33.2 g/dL (ref 30.0–36.0)
MCV: 87.4 fL (ref 78.0–100.0)
Platelets: 241 10*3/uL (ref 150–400)
RBC: 4.51 MIL/uL (ref 3.87–5.11)
RDW: 14.8 % (ref 11.5–15.5)
WBC: 10.3 10*3/uL (ref 4.0–10.5)

## 2015-06-07 LAB — ETHANOL: Alcohol, Ethyl (B): <5 mg/dL (ref <5)

## 2015-06-07 LAB — CDS SEROLOGY

## 2015-06-07 LAB — PROTIME-INR
INR: 1.11 (ref 0.00–1.49)
PROTHROMBIN TIME: 14.5 s (ref 11.6–15.2)

## 2015-06-07 LAB — SALICYLATE LEVEL

## 2015-06-07 LAB — ACETAMINOPHEN LEVEL: ACETAMINOPHEN (TYLENOL), SERUM: 11 ug/mL (ref 10–30)

## 2015-06-07 LAB — SAMPLE TO BLOOD BANK

## 2015-06-07 MED ORDER — TETANUS-DIPHTH-ACELL PERTUSSIS 5-2.5-18.5 LF-MCG/0.5 IM SUSP
0.5000 mL | Freq: Once | INTRAMUSCULAR | Status: AC
  Administered 2015-06-07: 0.5 mL via INTRAMUSCULAR
  Filled 2015-06-07: qty 0.5

## 2015-06-07 MED ORDER — SODIUM CHLORIDE 0.9 % IV BOLUS (SEPSIS)
500.0000 mL | Freq: Once | INTRAVENOUS | Status: AC
  Administered 2015-06-07: 500 mL via INTRAVENOUS

## 2015-06-07 MED ORDER — IOHEXOL 300 MG/ML  SOLN
100.0000 mL | Freq: Once | INTRAMUSCULAR | Status: AC | PRN
  Administered 2015-06-07: 100 mL via INTRAVENOUS

## 2015-06-07 MED ORDER — NALOXONE HCL 0.4 MG/ML IJ SOLN
0.4000 mg | Freq: Once | INTRAMUSCULAR | Status: AC
  Administered 2015-06-07: 0.4 mg via INTRAVENOUS
  Filled 2015-06-07: qty 1

## 2015-06-07 NOTE — ED Notes (Signed)
Patient sent to CT by [facility]

## 2015-06-07 NOTE — ED Notes (Signed)
Attempted to ambulate, but pt was unsteady and lethargic. Josh PA notified.

## 2015-06-07 NOTE — ED Notes (Addendum)
Pt was able to stand up on her own, little lethargic and wobbly at first. Maintained gait and standing on her own to door and back. Pt states she "feel a lot better than before just sore". 02 remained 97% RA and HR in the 90s.

## 2015-06-07 NOTE — ED Notes (Addendum)
Pt ambulated to the bathroom with standby assist. Steady on feet with no dizziness or sob.
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
## 2015-06-07 NOTE — ED Notes (Signed)
Portable xray at bedside.
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
## 2015-06-07 NOTE — ED Notes (Signed)
PA at bedside.
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
## 2015-06-07 NOTE — ED Notes (Signed)
Patient placed on 2L states she sleeps with CpAP and needs some oxygen to sleep.

## 2015-06-07 NOTE — ED Notes (Signed)
Patient still off the floor for scan. 

## 2015-06-08 NOTE — ED Notes (Signed)
Pt given paper scrubs; clothes at bedside were cut off by EMS
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
## 2015-06-08 NOTE — ED Notes (Signed)
Ginger ale given

## 2015-06-12 ENCOUNTER — Other Ambulatory Visit (HOSPITAL_COMMUNITY): Payer: Medicare Other

## 2015-06-17 ENCOUNTER — Encounter: Payer: Self-pay | Admitting: Internal Medicine

## 2015-06-17 ENCOUNTER — Ambulatory Visit (INDEPENDENT_AMBULATORY_CARE_PROVIDER_SITE_OTHER): Payer: Medicare Other | Admitting: Internal Medicine

## 2015-06-17 VITALS — BP 124/72 | HR 80 | Temp 98.2°F | Resp 18 | Ht 62.0 in | Wt 193.0 lb

## 2015-06-17 DIAGNOSIS — R413 Other amnesia: Secondary | ICD-10-CM | POA: Diagnosis not present

## 2015-06-17 DIAGNOSIS — F1190 Opioid use, unspecified, uncomplicated: Secondary | ICD-10-CM | POA: Diagnosis not present

## 2015-06-17 DIAGNOSIS — Z23 Encounter for immunization: Secondary | ICD-10-CM

## 2015-06-17 DIAGNOSIS — Z79899 Other long term (current) drug therapy: Secondary | ICD-10-CM

## 2015-06-17 DIAGNOSIS — R404 Transient alteration of awareness: Secondary | ICD-10-CM

## 2015-06-17 DIAGNOSIS — R4020 Unspecified coma: Secondary | ICD-10-CM

## 2015-06-17 LAB — BASIC METABOLIC PANEL WITHOUT GFR
BUN: 17 mg/dL (ref 7–25)
CO2: 27 mmol/L (ref 20–31)
Calcium: 9.5 mg/dL (ref 8.6–10.4)
Chloride: 100 mmol/L (ref 98–110)
Creat: 0.64 mg/dL (ref 0.50–1.05)
GFR, Est African American: >89 mL/min
GFR, Est Non African American: >89 mL/min
Glucose, Bld: 115 mg/dL — ABNORMAL HIGH (ref 65–99)
Potassium: 4.1 mmol/L (ref 3.5–5.3)
Sodium: 135 mmol/L (ref 135–146)

## 2015-06-17 LAB — CBC WITH DIFFERENTIAL/PLATELET
BASOS PCT: 1 % (ref 0–1)
Basophils Absolute: 0.1 10*3/uL (ref 0.0–0.1)
EOS ABS: 0.3 10*3/uL (ref 0.0–0.7)
Eosinophils Relative: 4 % (ref 0–5)
HCT: 39.3 % (ref 36.0–46.0)
HEMOGLOBIN: 13.3 g/dL (ref 12.0–15.0)
Lymphocytes Relative: 34 % (ref 12–46)
Lymphs Abs: 2.3 10*3/uL (ref 0.7–4.0)
MCH: 29.4 pg (ref 26.0–34.0)
MCHC: 33.8 g/dL (ref 30.0–36.0)
MCV: 86.8 fL (ref 78.0–100.0)
MPV: 9.7 fL (ref 8.6–12.4)
Monocytes Absolute: 0.6 10*3/uL (ref 0.1–1.0)
Monocytes Relative: 9 % (ref 3–12)
NEUTROS ABS: 3.5 10*3/uL (ref 1.7–7.7)
NEUTROS PCT: 52 % (ref 43–77)
PLATELETS: 303 10*3/uL (ref 150–400)
RBC: 4.53 MIL/uL (ref 3.87–5.11)
RDW: 15.7 % — ABNORMAL HIGH (ref 11.5–15.5)
WBC: 6.7 10*3/uL (ref 4.0–10.5)

## 2015-06-17 LAB — HEPATIC FUNCTION PANEL
ALT: 16 U/L (ref 6–29)
AST: 12 U/L (ref 10–35)
Albumin: 4.3 g/dL (ref 3.6–5.1)
Alkaline Phosphatase: 116 U/L (ref 33–130)
Bilirubin, Direct: 0.1 mg/dL
Indirect Bilirubin: 0.2 mg/dL (ref 0.2–1.2)
Total Bilirubin: 0.3 mg/dL (ref 0.2–1.2)
Total Protein: 6.9 g/dL (ref 6.1–8.1)

## 2015-06-17 LAB — IRON AND TIBC
%SAT: 12 % (ref 11–50)
IRON: 45 ug/dL (ref 45–160)
TIBC: 373 ug/dL (ref 250–450)
UIBC: 328 ug/dL (ref 125–400)

## 2015-06-17 LAB — VITAMIN B12: Vitamin B-12: 584 pg/mL (ref 211–911)

## 2015-06-17 LAB — TSH: TSH: 1.924 u[IU]/mL (ref 0.350–4.500)

## 2015-06-17 MED ORDER — TRAZODONE HCL 150 MG PO TABS: 150.0000 mg | ORAL_TABLET | Freq: Every day | ORAL | Status: DC
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
## 2015-06-22 ENCOUNTER — Ambulatory Visit (INDEPENDENT_AMBULATORY_CARE_PROVIDER_SITE_OTHER): Payer: Medicare Other | Admitting: Neurology

## 2015-06-22 ENCOUNTER — Encounter: Payer: Self-pay | Admitting: Neurology

## 2015-06-22 VITALS — BP 150/87 | HR 89 | Ht 62.0 in | Wt 195.0 lb

## 2015-06-22 DIAGNOSIS — F329 Major depressive disorder, single episode, unspecified: Secondary | ICD-10-CM | POA: Diagnosis not present

## 2015-06-22 DIAGNOSIS — R404 Transient alteration of awareness: Secondary | ICD-10-CM

## 2015-06-22 DIAGNOSIS — R4020 Unspecified coma: Secondary | ICD-10-CM

## 2015-06-22 DIAGNOSIS — Z79899 Other long term (current) drug therapy: Secondary | ICD-10-CM | POA: Diagnosis not present

## 2015-06-22 DIAGNOSIS — F32A Depression, unspecified: Secondary | ICD-10-CM

## 2015-06-22 NOTE — Patient Instructions (Signed)
Overall you are doing fairly well but I do want to suggest a few things today:   Remember to drink plenty of fluid, eat healthy meals and do not skip any meals. Try to eat protein with a every meal and eat a healthy snack such as fruit or nuts in between meals. Try to keep a regular sleep-wake schedule and try to exercise daily, particularly in the form of walking, 20-30 minutes a day, if you can.   As far as diagnostic testing: EEG, MRI brain  I would like to see you back in 4 months, sooner if we need to. Please call us with any interim questions, concerns, problems, updates or refill requests.   Our number is [phone]. We also have an after hours call service for urgent matters and there is a physician on-call for urgent questions. For any emergencies you know to call 911 or go to the nearest emergency room
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
## 2015-06-23 ENCOUNTER — Encounter: Payer: Self-pay | Admitting: Neurology

## 2015-06-30 ENCOUNTER — Telehealth: Payer: Self-pay | Admitting: *Deleted

## 2015-06-30 NOTE — Telephone Encounter (Signed)
Insurance denied Adderall Rx, requests we change to Ritalin.  Per Starlyn Skeans, PA-C, we will not refill that Rx for her at this time d/t recent hospital visit and medications discussed at her last f/u ov.  Patient aware of this and expressed understanding.

## 2015-07-01 ENCOUNTER — Other Ambulatory Visit: Payer: Medicare Other

## 2015-07-06 ENCOUNTER — Ambulatory Visit
Admission: RE | Admit: 2015-07-06 | Discharge: 2015-07-06 | Disposition: A | Discharge status: Home / Self Care | Payer: Medicare Other | Source: Ambulatory Visit | Attending: Neurology | Admitting: Neurology

## 2015-07-06 DIAGNOSIS — F329 Major depressive disorder, single episode, unspecified: Secondary | ICD-10-CM

## 2015-07-06 DIAGNOSIS — F32A Depression, unspecified: Secondary | ICD-10-CM

## 2015-07-06 DIAGNOSIS — R4020 Unspecified coma: Secondary | ICD-10-CM

## 2015-07-06 DIAGNOSIS — R404 Transient alteration of awareness: Secondary | ICD-10-CM

## 2015-07-06 DIAGNOSIS — Z79899 Other long term (current) drug therapy: Secondary | ICD-10-CM

## 2015-07-06 MED ORDER — GADOBENATE DIMEGLUMINE 529 MG/ML IV SOLN: 17.0000 mL | Freq: Once | INTRAVENOUS | Status: DC | PRN

## 2015-07-06 NOTE — Pre-Procedure Instructions (Addendum)
Sara Cross  07/06/2015      [facility] [place] ([place]), [place] [id number]  ([place]) [place] [address] Phone: [phone] Fax: [phone]    Your procedure is scheduled on Wed, Dec 7 @ 12:40 PM  Report to [facility] Admitting at 10:40 AM  Call this number if you have problems the morning of surgery:  [phone]   Remember:  Do not eat food or drink liquids after midnight.  Take these medicines the morning of surgery with A SIP OF WATER Wellbutrin(Bupropion),Omeprazole(Prilosec),and Pain Pill(if needed)             No Goody's,BC's,Aleve,Aspirin,Ibuprofen,Advil,Motrin,Fish Oil,or any Herbal Medications.    What do I do about my diabetes medications?   Do not take oral diabetes medicines (pills) the morning of surgery.    Do not wear jewelry, make-up or nail polish.  Do not wear lotions, powders, or perfumes.  You may wear deodorant.  Do not shave 48 hours prior to surgery.    Do not bring valuables to the hospital.  [facility] is not responsible for any belongings or valuables.  Contacts, dentures or bridgework may not be worn into surgery.  Leave your suitcase in the car.  After surgery it may be brought to your room.  For patients admitted to the hospital, discharge time will be determined by your treatment team.  Patients discharged the day of surgery will not be allowed to drive home.    Special instructions:   - Preparing for Surgery  Before surgery, you can play an important role.  Because skin is not sterile, your skin needs to be as free of germs as possible.  You can reduce the number of germs on you skin by washing with CHG (chlorahexidine gluconate) soap before surgery.  CHG is an antiseptic cleaner which kills germs and bonds with the skin to continue killing germs even after washing.  Please DO NOT use if you have an allergy to CHG or antibacterial soaps.  If your skin becomes  reddened/irritated stop using the CHG and inform your nurse when you arrive at Short Stay.  Do not shave (including legs and underarms) for at least 48 hours prior to the first CHG shower.  You may shave your face.  Please follow these instructions carefully:   1.  Shower with CHG Soap the night before surgery and the morning of Surgery.  2.  If you choose to wash your hair, wash your hair first as usual with your normal shampoo.  3.  After you shampoo, rinse your hair and body thoroughly to remove the Shampoo.  4.  Use CHG as you would any other liquid soap.  You can apply chg directly to the skin and wash gently with scrungie or a clean washcloth.  5.  Apply the CHG Soap to your body ONLY FROM THE NECK DOWN. Do not use on open wounds or open sores.  Avoid contact with your eyes, ears, mouth and genitals (private parts).  Wash genitals (private parts) with your normal soap.  6.  Wash thoroughly, paying special attention to the area where your surgery will be performed.  7.  Thoroughly rinse your body with warm water from the neck down.  8.  DO NOT shower/wash with your normal soap after using and rinsing off the CHG Soap.  9.  Pat yourself dry with a clean towel.  10.  Wear clean pajamas.            11.  Place clean sheets on your bed the night of your first shower and do not sleep with pets.  Day of Surgery  Do not apply any lotions/deoderants the morning of surgery.  Please wear clean clothes to the hospital/surgery center.    Please read over the following fact sheets that you were given. Pain Booklet, Coughing and Deep Breathing, MRSA Information and Surgical Site Infection Prevention      How to Manage Your Diabetes Before Surgery   Why is it important to control my blood sugar before and after surgery?   Improving blood sugar levels before and after surgery helps healing and can limit problems.  A way of improving blood sugar control is eating a healthy diet  by:  - Eating less sugar and carbohydrates  - Increasing activity/exercise  - Talk with your doctor about reaching your blood sugar goals  High blood sugars (greater than 180 mg/dL) can raise your risk of infections and slow down your recovery so you will need to focus on controlling your diabetes during the weeks before surgery.  Make sure that the doctor who takes care of your diabetes knows about your planned surgery including the date and location.  How do I manage my blood sugars before surgery?   Check your blood sugar at least 4 times a day, 2 days before surgery to make sure that they are not too high or low.   Check your blood sugar the morning of your surgery when you wake up and every 2 hours until you get to the Short-Stay unit.  If your blood sugar is less than 70 mg/dL, you will need to treat for low blood sugar by:  Treat a low blood sugar (less than 70 mg/dL) with 1/2 cup of clear juice (cranberry or apple), 4 glucose tablets, OR glucose gel.  Recheck blood sugar in 15 minutes after treatment (to make sure it is greater than 70 mg/dL).  If blood sugar is not greater than 70 mg/dL on re-check, call [phone] for further instructions.   Report your blood sugar to the Short-Stay nurse when you get to Short-Stay.  References:  [facility], 2007 "How to Manage your Diabetes Before and After Surgery".

## 2015-07-07 ENCOUNTER — Encounter (HOSPITAL_COMMUNITY): Payer: Self-pay

## 2015-07-07 ENCOUNTER — Encounter (HOSPITAL_COMMUNITY)
Admission: RE | Admit: 2015-07-07 | Discharge: 2015-07-07 | Disposition: A | Discharge status: Home / Self Care | Payer: Medicare Other | Source: Ambulatory Visit | Attending: Neurosurgery | Admitting: Neurosurgery

## 2015-07-07 DIAGNOSIS — Z79899 Other long term (current) drug therapy: Secondary | ICD-10-CM | POA: Diagnosis not present

## 2015-07-07 DIAGNOSIS — R404 Transient alteration of awareness: Secondary | ICD-10-CM | POA: Diagnosis not present

## 2015-07-07 DIAGNOSIS — F329 Major depressive disorder, single episode, unspecified: Secondary | ICD-10-CM | POA: Diagnosis not present

## 2015-07-07 HISTORY — DX: Paresthesia of skin: R20.0

## 2015-07-07 HISTORY — DX: Cardiac murmur, unspecified: R01.1

## 2015-07-07 HISTORY — DX: Paresthesia of skin: R20.2

## 2015-07-07 HISTORY — DX: Personal history of other diseases of the respiratory system: Z87.09

## 2015-07-07 LAB — GLUCOSE, CAPILLARY: Glucose-Capillary: 128 mg/dL — ABNORMAL HIGH (ref 65–99)

## 2015-07-07 LAB — SURGICAL PCR SCREEN
MRSA, PCR: NEGATIVE
STAPHYLOCOCCUS AUREUS: NEGATIVE

## 2015-07-07 NOTE — Progress Notes (Addendum)
PCP - Dr. Melford Aase Cardiologist - Dr. Fransico Him   EKG - 06/07/15 - Epic CXR - denies  Echo- 2015  Stress test - 2015 Cardiac cath - denies  Patient denies chest pain and shortness of breath at PAT appointment.   Patient states that she does not check her blood sugar every day but when she does take a fasting blood sugar it is around 120.  Patient educated on the importance of a tight glucose control prior to surgery and verbalizes understanding.

## 2015-07-07 NOTE — Progress Notes (Signed)
Due to a change in surgery date, labs were not collected at PAT appointment.  Patient has an appointment to have labs drawn on 07/08/15 at 0900.

## 2015-07-08 ENCOUNTER — Ambulatory Visit (HOSPITAL_COMMUNITY)
Admission: RE | Admit: 2015-07-08 | Discharge: 2015-07-08 | Disposition: A | Discharge status: Home / Self Care | Payer: Medicare Other | Source: Ambulatory Visit | Attending: Neurology | Admitting: Neurology

## 2015-07-08 ENCOUNTER — Encounter (HOSPITAL_COMMUNITY)
Admission: RE | Admit: 2015-07-08 | Discharge: 2015-07-08 | Disposition: A | Discharge status: Home / Self Care | Payer: Medicare Other | Source: Ambulatory Visit | Attending: Neurology | Admitting: Neurology

## 2015-07-08 ENCOUNTER — Telehealth: Payer: Self-pay

## 2015-07-08 DIAGNOSIS — Z79899 Other long term (current) drug therapy: Secondary | ICD-10-CM

## 2015-07-08 DIAGNOSIS — R55 Syncope and collapse: Secondary | ICD-10-CM | POA: Diagnosis not present

## 2015-07-08 DIAGNOSIS — F329 Major depressive disorder, single episode, unspecified: Secondary | ICD-10-CM | POA: Insufficient documentation

## 2015-07-08 DIAGNOSIS — R404 Transient alteration of awareness: Secondary | ICD-10-CM | POA: Diagnosis not present

## 2015-07-08 DIAGNOSIS — R4020 Unspecified coma: Secondary | ICD-10-CM

## 2015-07-08 DIAGNOSIS — F32A Depression, unspecified: Secondary | ICD-10-CM

## 2015-07-08 LAB — CBC
HCT: 36.8 % (ref 36.0–46.0)
Hemoglobin: 12 g/dL (ref 12.0–15.0)
MCH: 29.1 pg (ref 26.0–34.0)
MCHC: 32.6 g/dL (ref 30.0–36.0)
MCV: 89.1 fL (ref 78.0–100.0)
PLATELETS: 236 10*3/uL (ref 150–400)
RBC: 4.13 MIL/uL (ref 3.87–5.11)
RDW: 15.2 % (ref 11.5–15.5)
WBC: 4.9 10*3/uL (ref 4.0–10.5)

## 2015-07-08 LAB — BASIC METABOLIC PANEL
Anion gap: 9 (ref 5–15)
BUN: 9 mg/dL (ref 6–20)
CALCIUM: 9.4 mg/dL (ref 8.9–10.3)
CO2: 24 mmol/L (ref 22–32)
CREATININE: 0.58 mg/dL (ref 0.44–1.00)
Chloride: 105 mmol/L (ref 101–111)
GFR calc non Af Amer: >60 mL/min (ref >60)
Glucose, Bld: 137 mg/dL — ABNORMAL HIGH (ref 65–99)
Potassium: 4.2 mmol/L (ref 3.5–5.1)
SODIUM: 138 mmol/L (ref 135–145)

## 2015-07-08 LAB — TYPE AND SCREEN
ABO/RH(D): O NEG
Antibody Screen: NEGATIVE
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
## 2015-07-08 NOTE — Telephone Encounter (Signed)
I called the patient and read to her Dr. Cathren Laine message. She had no further questions and thanked me for calling.

## 2015-07-08 NOTE — Progress Notes (Signed)
OP routine EEG completed, results pending.
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
## 2015-07-09 LAB — HEMOGLOBIN A1C
HEMOGLOBIN A1C: 6.7 % — AB (ref 4.8–5.6)
MEAN PLASMA GLUCOSE: 146 mg/dL

## 2015-07-12 ENCOUNTER — Other Ambulatory Visit: Payer: Self-pay | Admitting: Internal Medicine

## 2015-07-13 ENCOUNTER — Telehealth: Payer: Self-pay | Admitting: *Deleted

## 2015-07-13 NOTE — Telephone Encounter (Signed)
Pt called office back. Advised per Dr Jaynee Eagles that EEG normal and labs showed hemoglobin A1C elevated at 6.7. She needs to f/u with PCP per Dr Jaynee Eagles. Pt verbalized understanding. She is going to drop off DMV paperwork that needs to be filled out. Advised there will be a charge for this. She understands.

## 2015-07-13 NOTE — Telephone Encounter (Signed)
-----   Message from Melvenia Beam, MD sent at 07/13/2015  8:32 AM EST ----- Let patient know her hgba1c was abnormal at 6.7. Follow up with primary care. thanks

## 2015-07-13 NOTE — Telephone Encounter (Signed)
LVM for pt to call back. Gave [facility] phone number.  

## 2015-07-16 ENCOUNTER — Other Ambulatory Visit (HOSPITAL_COMMUNITY): Payer: Medicare Other

## 2015-07-21 ENCOUNTER — Ambulatory Visit: Payer: Self-pay | Admitting: Physician Assistant

## 2015-07-21 NOTE — Progress Notes (Signed)
I spoke with patient and confirmed arrival time tomorrow 07/22/15, 10:40AM.

## 2015-07-22 ENCOUNTER — Inpatient Hospital Stay (HOSPITAL_COMMUNITY): Payer: Medicare Other | Admitting: Certified Registered Nurse Anesthetist

## 2015-07-22 ENCOUNTER — Encounter (HOSPITAL_COMMUNITY): Payer: Self-pay | Admitting: *Deleted

## 2015-07-22 ENCOUNTER — Inpatient Hospital Stay (HOSPITAL_COMMUNITY)
Admission: RE | Admit: 2015-07-22 | Discharge: 2015-07-24 | DRG: 460 | Disposition: A | Discharge status: Home / Self Care | Payer: Medicare Other | Source: Ambulatory Visit | Attending: Neurosurgery | Admitting: Neurosurgery

## 2015-07-22 ENCOUNTER — Inpatient Hospital Stay (HOSPITAL_COMMUNITY): Payer: Medicare Other

## 2015-07-22 ENCOUNTER — Encounter (HOSPITAL_COMMUNITY)
Admission: RE | Disposition: A | Discharge status: Home / Self Care | Payer: Medicare Other | Source: Ambulatory Visit | Attending: Neurosurgery

## 2015-07-22 DIAGNOSIS — S32009K Unspecified fracture of unspecified lumbar vertebra, subsequent encounter for fracture with nonunion: Secondary | ICD-10-CM | POA: Diagnosis present

## 2015-07-22 DIAGNOSIS — F329 Major depressive disorder, single episode, unspecified: Secondary | ICD-10-CM | POA: Diagnosis present

## 2015-07-22 DIAGNOSIS — E785 Hyperlipidemia, unspecified: Secondary | ICD-10-CM | POA: Diagnosis present

## 2015-07-22 DIAGNOSIS — K219 Gastro-esophageal reflux disease without esophagitis: Secondary | ICD-10-CM

## 2015-07-22 DIAGNOSIS — I10 Essential (primary) hypertension: Secondary | ICD-10-CM | POA: Diagnosis present

## 2015-07-22 DIAGNOSIS — E119 Type 2 diabetes mellitus without complications: Secondary | ICD-10-CM | POA: Diagnosis present

## 2015-07-22 DIAGNOSIS — E1142 Type 2 diabetes mellitus with diabetic polyneuropathy: Secondary | ICD-10-CM

## 2015-07-22 DIAGNOSIS — G4730 Sleep apnea, unspecified: Secondary | ICD-10-CM | POA: Diagnosis present

## 2015-07-22 DIAGNOSIS — T84038A Mechanical loosening of other internal prosthetic joint, initial encounter: Secondary | ICD-10-CM | POA: Diagnosis present

## 2015-07-22 DIAGNOSIS — F419 Anxiety disorder, unspecified: Secondary | ICD-10-CM | POA: Diagnosis present

## 2015-07-22 DIAGNOSIS — Z6835 Body mass index (BMI) 35.0-35.9, adult: Secondary | ICD-10-CM | POA: Diagnosis not present

## 2015-07-22 DIAGNOSIS — Z87891 Personal history of nicotine dependence: Secondary | ICD-10-CM

## 2015-07-22 DIAGNOSIS — M960 Pseudarthrosis after fusion or arthrodesis: Secondary | ICD-10-CM | POA: Diagnosis present

## 2015-07-22 DIAGNOSIS — Z79899 Other long term (current) drug therapy: Secondary | ICD-10-CM | POA: Diagnosis not present

## 2015-07-22 DIAGNOSIS — M549 Dorsalgia, unspecified: Secondary | ICD-10-CM | POA: Diagnosis present

## 2015-07-22 DIAGNOSIS — M48061 Spinal stenosis, lumbar region without neurogenic claudication: Secondary | ICD-10-CM

## 2015-07-22 DIAGNOSIS — Z8249 Family history of ischemic heart disease and other diseases of the circulatory system: Secondary | ICD-10-CM | POA: Diagnosis not present

## 2015-07-22 DIAGNOSIS — Y838 Other surgical procedures as the cause of abnormal reaction of the patient, or of later complication, without mention of misadventure at the time of the procedure: Secondary | ICD-10-CM | POA: Diagnosis present

## 2015-07-22 DIAGNOSIS — Z888 Allergy status to other drugs, medicaments and biological substances status: Secondary | ICD-10-CM

## 2015-07-22 DIAGNOSIS — Z7984 Long term (current) use of oral hypoglycemic drugs: Secondary | ICD-10-CM | POA: Diagnosis not present

## 2015-07-22 HISTORY — PX: APPLICATION OF INTRAOPERATIVE CT SCAN: SHX6668

## 2015-07-22 LAB — GLUCOSE, CAPILLARY
GLUCOSE-CAPILLARY: 125 mg/dL — AB (ref 65–99)
GLUCOSE-CAPILLARY: 124 mg/dL — AB (ref 65–99)
GLUCOSE-CAPILLARY: 120 mg/dL — AB (ref 65–99)

## 2015-07-22 SURGERY — POSTERIOR LUMBAR FUSION 1 LEVEL
Anesthesia: General | Site: Spine Lumbar

## 2015-07-22 MED ORDER — CEFAZOLIN SODIUM-DEXTROSE 2-3 GM-% IV SOLR: 2.0000 g | INTRAVENOUS | Status: DC

## 2015-07-22 MED ORDER — FENTANYL CITRATE (PF) 250 MCG/5ML IJ SOLN
INTRAMUSCULAR | Status: AC
  Filled 2015-07-22: qty 5

## 2015-07-22 MED ORDER — DEXAMETHASONE SODIUM PHOSPHATE 10 MG/ML IJ SOLN: 10.0000 mg | INTRAMUSCULAR | Status: DC

## 2015-07-22 MED ORDER — DIAZEPAM 5 MG/ML IJ SOLN
2.0000 mg | Freq: Once | INTRAMUSCULAR | Status: AC
  Administered 2015-07-22: 2 mg via INTRAVENOUS

## 2015-07-22 MED ORDER — MENTHOL 3 MG MT LOZG: 1.0000 | LOZENGE | OROMUCOSAL | Status: DC | PRN

## 2015-07-22 MED ORDER — SUGAMMADEX SODIUM 500 MG/5ML IV SOLN
INTRAVENOUS | Status: AC
  Filled 2015-07-22: qty 5

## 2015-07-22 MED ORDER — DEXAMETHASONE SODIUM PHOSPHATE 10 MG/ML IJ SOLN
INTRAMUSCULAR | Status: AC
  Filled 2015-07-22: qty 1

## 2015-07-22 MED ORDER — VANCOMYCIN HCL 1000 MG IV SOLR
INTRAVENOUS | Status: DC | PRN
  Administered 2015-07-22: 1000 mg via TOPICAL

## 2015-07-22 MED ORDER — VITAMIN B-12 1000 MCG PO TABS
1000.0000 ug | ORAL_TABLET | Freq: Every day | ORAL | Status: DC
  Administered 2015-07-23 – 2015-07-24 (×2): 1000 ug via ORAL
  Filled 2015-07-22 (×2): qty 1

## 2015-07-22 MED ORDER — CYCLOBENZAPRINE HCL 10 MG PO TABS
10.0000 mg | ORAL_TABLET | Freq: Three times a day (TID) | ORAL | Status: DC | PRN
  Administered 2015-07-23 – 2015-07-24 (×3): 10 mg via ORAL
  Filled 2015-07-22 (×3): qty 1

## 2015-07-22 MED ORDER — ACETAMINOPHEN 650 MG RE SUPP: 650.0000 mg | RECTAL | Status: DC | PRN

## 2015-07-22 MED ORDER — HYDROMORPHONE HCL 1 MG/ML IJ SOLN
0.2500 mg | INTRAMUSCULAR | Status: DC | PRN
  Administered 2015-07-22 (×3): 0.5 mg via INTRAVENOUS

## 2015-07-22 MED ORDER — CEFAZOLIN SODIUM-DEXTROSE 2-3 GM-% IV SOLR
2.0000 g | Freq: Three times a day (TID) | INTRAVENOUS | Status: DC
  Administered 2015-07-23 – 2015-07-24 (×5): 2 g via INTRAVENOUS
  Filled 2015-07-22 (×6): qty 50

## 2015-07-22 MED ORDER — SUGAMMADEX SODIUM 200 MG/2ML IV SOLN
INTRAVENOUS | Status: DC | PRN
  Administered 2015-07-22: 180 mg via INTRAVENOUS

## 2015-07-22 MED ORDER — PROMETHAZINE HCL 25 MG/ML IJ SOLN
6.2500 mg | INTRAMUSCULAR | Status: DC | PRN
  Administered 2015-07-22: 6.25 mg via INTRAVENOUS

## 2015-07-22 MED ORDER — SODIUM CHLORIDE 0.9 % IJ SOLN
INTRAMUSCULAR | Status: AC
  Filled 2015-07-22: qty 10

## 2015-07-22 MED ORDER — DOCUSATE SODIUM 100 MG PO CAPS
100.0000 mg | ORAL_CAPSULE | Freq: Two times a day (BID) | ORAL | Status: DC
  Administered 2015-07-22 – 2015-07-24 (×4): 100 mg via ORAL
  Filled 2015-07-22 (×4): qty 1

## 2015-07-22 MED ORDER — HYDROMORPHONE HCL 1 MG/ML IJ SOLN
INTRAMUSCULAR | Status: AC
  Filled 2015-07-22: qty 1

## 2015-07-22 MED ORDER — ROCURONIUM BROMIDE 50 MG/5ML IV SOLN
INTRAVENOUS | Status: AC
  Filled 2015-07-22: qty 3

## 2015-07-22 MED ORDER — ARTIFICIAL TEARS OP OINT
TOPICAL_OINTMENT | OPHTHALMIC | Status: DC | PRN
  Administered 2015-07-22: 1 via OPHTHALMIC

## 2015-07-22 MED ORDER — LACTATED RINGERS IV SOLN
INTRAVENOUS | Status: DC
  Administered 2015-07-22 (×4): via INTRAVENOUS

## 2015-07-22 MED ORDER — PHENYLEPHRINE HCL 10 MG/ML IJ SOLN
INTRAMUSCULAR | Status: DC | PRN
  Administered 2015-07-22: 80 ug via INTRAVENOUS

## 2015-07-22 MED ORDER — HYDROMORPHONE HCL 1 MG/ML IJ SOLN
0.5000 mg | INTRAMUSCULAR | Status: DC | PRN
  Administered 2015-07-22 – 2015-07-23 (×4): 1 mg via INTRAVENOUS
  Filled 2015-07-22 (×4): qty 1

## 2015-07-22 MED ORDER — MIDAZOLAM HCL 5 MG/5ML IJ SOLN
INTRAMUSCULAR | Status: DC | PRN
  Administered 2015-07-22: 2 mg via INTRAVENOUS

## 2015-07-22 MED ORDER — PANTOPRAZOLE SODIUM 40 MG PO TBEC
40.0000 mg | DELAYED_RELEASE_TABLET | Freq: Every day | ORAL | Status: DC
  Administered 2015-07-23 – 2015-07-24 (×2): 40 mg via ORAL
  Filled 2015-07-22 (×2): qty 1

## 2015-07-22 MED ORDER — VANCOMYCIN HCL 1000 MG IV SOLR
INTRAVENOUS | Status: AC
  Filled 2015-07-22: qty 1000

## 2015-07-22 MED ORDER — ROCURONIUM BROMIDE 100 MG/10ML IV SOLN
INTRAVENOUS | Status: DC | PRN
  Administered 2015-07-22 (×2): 20 mg via INTRAVENOUS
  Administered 2015-07-22: 50 mg via INTRAVENOUS
  Administered 2015-07-22 (×5): 10 mg via INTRAVENOUS

## 2015-07-22 MED ORDER — METFORMIN HCL ER 500 MG PO TB24
1000.0000 mg | ORAL_TABLET | Freq: Two times a day (BID) | ORAL | Status: DC
  Administered 2015-07-23 – 2015-07-24 (×3): 1000 mg via ORAL
  Filled 2015-07-22 (×3): qty 2

## 2015-07-22 MED ORDER — ONDANSETRON HCL 4 MG/2ML IJ SOLN
INTRAMUSCULAR | Status: AC
  Filled 2015-07-22: qty 2

## 2015-07-22 MED ORDER — ALENDRONATE SODIUM 70 MG PO TABS: 70.0000 mg | ORAL_TABLET | ORAL | Status: DC

## 2015-07-22 MED ORDER — BUPROPION HCL ER (XL) 150 MG PO TB24
300.0000 mg | ORAL_TABLET | Freq: Every day | ORAL | Status: DC
  Administered 2015-07-23 – 2015-07-24 (×2): 300 mg via ORAL
  Filled 2015-07-22 (×2): qty 2

## 2015-07-22 MED ORDER — ROCURONIUM BROMIDE 50 MG/5ML IV SOLN
INTRAVENOUS | Status: AC
  Filled 2015-07-22: qty 1

## 2015-07-22 MED ORDER — PROMETHAZINE HCL 25 MG/ML IJ SOLN
INTRAMUSCULAR | Status: AC
  Administered 2015-07-22: 6.25 mg via INTRAVENOUS
  Filled 2015-07-22: qty 1

## 2015-07-22 MED ORDER — ACETAMINOPHEN 325 MG PO TABS: 650.0000 mg | ORAL_TABLET | ORAL | Status: DC | PRN

## 2015-07-22 MED ORDER — LISINOPRIL 20 MG PO TABS
20.0000 mg | ORAL_TABLET | Freq: Every day | ORAL | Status: DC
  Administered 2015-07-23 – 2015-07-24 (×2): 20 mg via ORAL
  Filled 2015-07-22 (×2): qty 1

## 2015-07-22 MED ORDER — CEFAZOLIN SODIUM-DEXTROSE 2-3 GM-% IV SOLR
INTRAVENOUS | Status: AC
  Administered 2015-07-22 (×2): 2 g via INTRAVENOUS
  Filled 2015-07-22: qty 50

## 2015-07-22 MED ORDER — LISINOPRIL 20 MG PO TABS: 20.0000 mg | ORAL_TABLET | Freq: Every day | ORAL | Status: DC

## 2015-07-22 MED ORDER — LIDOCAINE-EPINEPHRINE 1 %-1:100000 IJ SOLN
INTRAMUSCULAR | Status: DC | PRN
  Administered 2015-07-22: 10 mL

## 2015-07-22 MED ORDER — PHENOL 1.4 % MT LIQD: 1.0000 | OROMUCOSAL | Status: DC | PRN

## 2015-07-22 MED ORDER — OXYCODONE-ACETAMINOPHEN 5-325 MG PO TABS: 1.0000 | ORAL_TABLET | Freq: Once | ORAL | Status: DC

## 2015-07-22 MED ORDER — VITAMIN D 1000 UNITS PO TABS
10000.0000 [IU] | ORAL_TABLET | Freq: Every day | ORAL | Status: DC
  Administered 2015-07-23 – 2015-07-24 (×2): 10000 [IU] via ORAL
  Filled 2015-07-22 (×2): qty 10

## 2015-07-22 MED ORDER — LIDOCAINE HCL (CARDIAC) 20 MG/ML IV SOLN
INTRAVENOUS | Status: DC | PRN
  Administered 2015-07-22: 100 mg via INTRAVENOUS

## 2015-07-22 MED ORDER — SODIUM CHLORIDE 0.9 % IR SOLN
Status: DC | PRN
  Administered 2015-07-22: 14:00:00

## 2015-07-22 MED ORDER — OXYCODONE-ACETAMINOPHEN 5-325 MG PO TABS
1.0000 | ORAL_TABLET | ORAL | Status: DC | PRN
  Administered 2015-07-22: 1 via ORAL
  Administered 2015-07-23: 2 via ORAL
  Administered 2015-07-23: 1 via ORAL
  Administered 2015-07-23: 2 via ORAL
  Administered 2015-07-23: 1 via ORAL
  Administered 2015-07-23 – 2015-07-24 (×4): 2 via ORAL
  Filled 2015-07-22: qty 1
  Filled 2015-07-22 (×3): qty 2
  Filled 2015-07-22: qty 1
  Filled 2015-07-22: qty 2
  Filled 2015-07-22: qty 1
  Filled 2015-07-22 (×2): qty 2

## 2015-07-22 MED ORDER — ONDANSETRON HCL 4 MG/2ML IJ SOLN
INTRAMUSCULAR | Status: DC | PRN
  Administered 2015-07-22: 4 mg via INTRAVENOUS

## 2015-07-22 MED ORDER — EPHEDRINE SULFATE 50 MG/ML IJ SOLN
INTRAMUSCULAR | Status: AC
  Filled 2015-07-22: qty 1

## 2015-07-22 MED ORDER — BUPIVACAINE LIPOSOME 1.3 % IJ SUSP
20.0000 mL | Freq: Once | INTRAMUSCULAR | Status: AC
  Administered 2015-07-22: 20 mL
  Filled 2015-07-22: qty 20

## 2015-07-22 MED ORDER — LIDOCAINE HCL (CARDIAC) 20 MG/ML IV SOLN
INTRAVENOUS | Status: AC
  Filled 2015-07-22: qty 5

## 2015-07-22 MED ORDER — FUROSEMIDE 40 MG PO TABS
40.0000 mg | ORAL_TABLET | Freq: Every day | ORAL | Status: DC
Start: 2015-07-23 — End: 2015-07-24
  Administered 2015-07-23 – 2015-07-24 (×2): 40 mg via ORAL
  Filled 2015-07-22 (×2): qty 1

## 2015-07-22 MED ORDER — SODIUM CHLORIDE 0.9 % IV SOLN
250.0000 mL | INTRAVENOUS | Status: DC
  Administered 2015-07-22: 250 mL via INTRAVENOUS

## 2015-07-22 MED ORDER — SODIUM CHLORIDE 0.9 % IJ SOLN: 3.0000 mL | INTRAMUSCULAR | Status: DC | PRN

## 2015-07-22 MED ORDER — THROMBIN 20000 UNITS EX SOLR
CUTANEOUS | Status: DC | PRN
  Administered 2015-07-22: 14:00:00 via TOPICAL

## 2015-07-22 MED ORDER — SODIUM CHLORIDE 0.9 % IJ SOLN
3.0000 mL | Freq: Two times a day (BID) | INTRAMUSCULAR | Status: DC
  Administered 2015-07-22 – 2015-07-24 (×3): 3 mL via INTRAVENOUS

## 2015-07-22 MED ORDER — ARTIFICIAL TEARS OP OINT
TOPICAL_OINTMENT | OPHTHALMIC | Status: AC
  Filled 2015-07-22: qty 3.5

## 2015-07-22 MED ORDER — FENTANYL CITRATE (PF) 100 MCG/2ML IJ SOLN
INTRAMUSCULAR | Status: DC | PRN
  Administered 2015-07-22 (×2): 50 ug via INTRAVENOUS
  Administered 2015-07-22: 100 ug via INTRAVENOUS
  Administered 2015-07-22: 150 ug via INTRAVENOUS
  Administered 2015-07-22 (×2): 50 ug via INTRAVENOUS
  Administered 2015-07-22: 100 ug via INTRAVENOUS
  Administered 2015-07-22: 50 ug via INTRAVENOUS
  Administered 2015-07-22: 100 ug via INTRAVENOUS
  Administered 2015-07-22: 50 ug via INTRAVENOUS

## 2015-07-22 MED ORDER — PROPOFOL 10 MG/ML IV BOLUS
INTRAVENOUS | Status: DC | PRN
  Administered 2015-07-22: 200 mg via INTRAVENOUS

## 2015-07-22 MED ORDER — DIAZEPAM 5 MG/ML IJ SOLN
INTRAMUSCULAR | Status: AC
  Administered 2015-07-22: 2 mg via INTRAVENOUS
  Filled 2015-07-22: qty 2

## 2015-07-22 MED ORDER — HYDROMORPHONE HCL 1 MG/ML IJ SOLN
INTRAMUSCULAR | Status: AC
  Administered 2015-07-22: 0.5 mg via INTRAVENOUS
  Filled 2015-07-22: qty 1

## 2015-07-22 MED ORDER — SUCCINYLCHOLINE CHLORIDE 20 MG/ML IJ SOLN
INTRAMUSCULAR | Status: AC
  Filled 2015-07-22: qty 1

## 2015-07-22 MED ORDER — 0.9 % SODIUM CHLORIDE (POUR BTL) OPTIME
TOPICAL | Status: DC | PRN
  Administered 2015-07-22: 1000 mL

## 2015-07-22 MED ORDER — PROPOFOL 10 MG/ML IV BOLUS
INTRAVENOUS | Status: AC
  Filled 2015-07-22: qty 20

## 2015-07-22 MED ORDER — MIDAZOLAM HCL 2 MG/2ML IJ SOLN
INTRAMUSCULAR | Status: AC
  Filled 2015-07-22: qty 2

## 2015-07-22 MED ORDER — OXYCODONE-ACETAMINOPHEN 5-325 MG PO TABS: 1.0000 | ORAL_TABLET | Freq: Four times a day (QID) | ORAL | Status: DC | PRN

## 2015-07-22 MED ORDER — ONDANSETRON HCL 4 MG/2ML IJ SOLN: 4.0000 mg | INTRAMUSCULAR | Status: DC | PRN

## 2015-07-22 MED ORDER — ZOLPIDEM TARTRATE 5 MG PO TABS: 5.0000 mg | ORAL_TABLET | Freq: Every evening | ORAL | Status: DC | PRN

## 2015-07-22 SURGICAL SUPPLY — 72 items
APL SKNCLS STERI-STRIP NONHPOA (GAUZE/BANDAGES/DRESSINGS) ×2
BAG DECANTER FOR FLEXI CONT (MISCELLANEOUS) ×4 IMPLANT
BENZOIN TINCTURE PRP APPL 2/3 (GAUZE/BANDAGES/DRESSINGS) ×4 IMPLANT
BLADE CLIPPER SURG (BLADE) IMPLANT
BLADE SURG 11 STRL SS (BLADE) ×4 IMPLANT
BRUSH SCRUB EZ PLAIN DRY (MISCELLANEOUS) ×4 IMPLANT
BUR MATCHSTICK NEURO 3.0 LAGG (BURR) ×4 IMPLANT
BUR PRECISION FLUTE 6.0 (BURR) ×4 IMPLANT
CANISTER SUCT 3000ML PPV (MISCELLANEOUS) ×4 IMPLANT
CAP REVERE LOCKING (Cap) ×16 IMPLANT
CLOSURE WOUND 1/2 X4 (GAUZE/BANDAGES/DRESSINGS) ×1
CONNECTOR OFFSET HEAD 6.35X25 (Connector) ×4 IMPLANT
CONT SPEC 4OZ CLIKSEAL STRL BL (MISCELLANEOUS) ×4 IMPLANT
COVER BACK TABLE 60X90IN (DRAPES) ×16 IMPLANT
DECANTER SPIKE VIAL GLASS SM (MISCELLANEOUS) ×4 IMPLANT
DRAPE C-ARM 42X72 X-RAY (DRAPES) ×4 IMPLANT
DRAPE C-ARMOR (DRAPES) ×4 IMPLANT
DRAPE LAPAROTOMY 100X72X124 (DRAPES) ×4 IMPLANT
DRAPE POUCH INSTRU U-SHP 10X18 (DRAPES) ×4 IMPLANT
DRAPE PROXIMA HALF (DRAPES) IMPLANT
DRAPE SCAN PATIENT (DRAPES) ×4 IMPLANT
DRAPE SURG 17X23 STRL (DRAPES) ×4 IMPLANT
DRSG OPSITE 4X5.5 SM (GAUZE/BANDAGES/DRESSINGS) ×2 IMPLANT
DRSG OPSITE POSTOP 4X6 (GAUZE/BANDAGES/DRESSINGS) ×2 IMPLANT
DRSG OPSITE POSTOP 4X8 (GAUZE/BANDAGES/DRESSINGS) ×2 IMPLANT
DURAPREP 26ML APPLICATOR (WOUND CARE) ×4 IMPLANT
ELECT REM PT RETURN 9FT ADLT (ELECTROSURGICAL) ×4
ELECTRODE REM PT RTRN 9FT ADLT (ELECTROSURGICAL) ×2 IMPLANT
EVACUATOR 3/16  PVC DRAIN (DRAIN) ×2
EVACUATOR 3/16 PVC DRAIN (DRAIN) ×2 IMPLANT
GAUZE SPONGE 4X4 12PLY STRL (GAUZE/BANDAGES/DRESSINGS) ×4 IMPLANT
GAUZE SPONGE 4X4 16PLY XRAY LF (GAUZE/BANDAGES/DRESSINGS) IMPLANT
GLOVE BIO SURGEON STRL SZ8 (GLOVE) ×8 IMPLANT
GLOVE ECLIPSE 7.5 STRL STRAW (GLOVE) IMPLANT
GLOVE EXAM NITRILE LRG STRL (GLOVE) IMPLANT
GLOVE EXAM NITRILE MD LF STRL (GLOVE) IMPLANT
GLOVE EXAM NITRILE XL STR (GLOVE) IMPLANT
GLOVE EXAM NITRILE XS STR PU (GLOVE) IMPLANT
GLOVE INDICATOR 8.5 STRL (GLOVE) ×8 IMPLANT
GOWN STRL REUS W/ TWL LRG LVL3 (GOWN DISPOSABLE) IMPLANT
GOWN STRL REUS W/ TWL XL LVL3 (GOWN DISPOSABLE) ×4 IMPLANT
GOWN STRL REUS W/TWL 2XL LVL3 (GOWN DISPOSABLE) IMPLANT
GOWN STRL REUS W/TWL LRG LVL3 (GOWN DISPOSABLE)
GOWN STRL REUS W/TWL XL LVL3 (GOWN DISPOSABLE) ×8
KIT BASIN OR (CUSTOM PROCEDURE TRAY) ×4 IMPLANT
KIT INFUSE SMALL (Orthopedic Implant) ×2 IMPLANT
KIT ROOM TURNOVER OR (KITS) ×4 IMPLANT
LIQUID BAND (GAUZE/BANDAGES/DRESSINGS) ×4 IMPLANT
MARKER SPHERE PSV REFLC 13MM (MARKER) ×34 IMPLANT
NDL ASP BONE MRW 8GX15 (NEEDLE) IMPLANT
NDL HYPO 25X1 1.5 SAFETY (NEEDLE) ×2 IMPLANT
NEEDLE ASP BONE MRW 8GX15 (NEEDLE) ×4 IMPLANT
NEEDLE HYPO 25X1 1.5 SAFETY (NEEDLE) ×4 IMPLANT
NS IRRIG 1000ML POUR BTL (IV SOLUTION) ×4 IMPLANT
PACK FOAM VITOSS 10CC (Orthopedic Implant) ×2 IMPLANT
PACK LAMINECTOMY NEURO (CUSTOM PROCEDURE TRAY) ×4 IMPLANT
PAD ARMBOARD 7.5X6 YLW CONV (MISCELLANEOUS) ×12 IMPLANT
ROD REVERE 6.35 CURVED 85MM (Rod) ×4 IMPLANT
ROD REVERE 7.5MM (Rod) IMPLANT
SCREW REVERE 6.35 6.5X35MM (Screw) ×4 IMPLANT
SCREW REVERE 6.35 9.0X80MM (Screw) ×4 IMPLANT
SPONGE LAP 4X18 X RAY DECT (DISPOSABLE) IMPLANT
SPONGE SURGIFOAM ABS GEL 100 (HEMOSTASIS) ×4 IMPLANT
STRIP CLOSURE SKIN 1/2X4 (GAUZE/BANDAGES/DRESSINGS) ×5 IMPLANT
SUT VIC AB 0 CT1 18XCR BRD8 (SUTURE) ×4 IMPLANT
SUT VIC AB 0 CT1 8-18 (SUTURE) ×8
SUT VIC AB 2-0 CT1 18 (SUTURE) ×4 IMPLANT
SUT VIC AB 4-0 PS2 27 (SUTURE) ×4 IMPLANT
TOWEL OR 17X24 6PK STRL BLUE (TOWEL DISPOSABLE) ×4 IMPLANT
TOWEL OR 17X26 10 PK STRL BLUE (TOWEL DISPOSABLE) ×4 IMPLANT
TRAY FOLEY W/METER SILVER 14FR (SET/KITS/TRAYS/PACK) ×4 IMPLANT
WATER STERILE IRR 1000ML POUR (IV SOLUTION) ×4 IMPLANT

## 2015-07-22 NOTE — Progress Notes (Signed)
Pt admitted from PACU post op, alert and oriented, pt settled in bed with family and call light within pt's reach,, v/s stable, will however continue to monitor. Obasogie-Asidi, Adelee Hannula Efe

## 2015-07-22 NOTE — Op Note (Signed)
Preoperative diagnosis: Pseudoarthrosis L5-S1 with loose S1 screws  Postoperative diagnosis: Same  Procedure: #1 reexploration of fusion removal of hardware L4-S1 with removal of loose S1 pedicle screws.  #2 placement of bilateral iliac screws measuring 9.0 x 80 mm in length as well as bilateral sacral alar screws measuring 6.5 x 35 mm in length utilizing the stereotactic spinal navigation system with placement of an emitter array over the L1 spinous process and the globus 6.35 [place] pedicle screw system.  #3 posterior lateral arthrodesis L4-S1 using BMP locally harvested autograft and Vitoss  Surgeon: Dominica Severin Tarrin Lebow  Asst.: Sherley Bounds  Anesthesia: Gen.  EBL: Minimal  History of present illness: Patient is a very pleasant 59 year old female. C undergone lumbar fusion we'll progress worsening back pain was noted on imaging to have loosening of her S1 screws and a pseudoarthrosis at L5-S1. Due to her failure conservative treatment imaging findings and progressive clinical syndrome I recommended reexpansion of fusion removal of hardware and redo L5-S1 fusion utilizing iliac screws and sacral alar screws. I extensively went over the risks and benefits of the operation as well as perioperative course expectations of outcome and alternatives to surgery and she understood and agreed to proceed forward.  Operative procedure: Patient brought into the or was induced on general anesthesia positioned prone on the [facility] table her back was prepped and draped in routine sterile fashion I exposed her lumbar incision exposing the hardware from L4-S1 dissected out the sacral alar and dissected out the posterior superior iliac spine through a separate fascial incision for placement of the iliac bolts. I then disconnected the knots remove the rods and remove the S1 pedicle screws which were markedly loose. I inspected the fusion L5-S1 that was not competent. She had pseudoarthrosis L5-S1. I further dissected out the  sacral alar for placement of the sacral alar screws. Then through a separate skin incision and dissected down and expose the L1 spinous process for placement of the emiiter array for the stereotactic navigation system. Then CT scan registered the Metter and the anatomy we confirmed CT had adequate imaging. Then I confirmed adequate referencing and then utilizing the stereotactic navigation system I identified the entry point into each iliac crest to the posterior superior iliac spine. Then used the awl to cannulate it tapped it with a 75 tap and then placed 9.0 x 80 mm iliac screws bilaterally. Then utilizing the navigation system again I placed 2 sacral alar screws 6.5 x 35 mm. After all screws in place the postop CT scan confirmed positioning of all the implants. Then I placed a connector into the iliac screw and contoured a rod and hooked up to the old hardware. Top tightness tightened down of it was anchored in place. Then the wound scope was irrigated irrigated meticulous in space was maintained vancomycin powder was sprayed over the wound and then X Breaux was injected in the fascia a large Hemovac drain was placed and both wounds were closed with interrupted Vicryl's running 4 subcuticular Dermabond benzo and Steri-Strips and sterile dressings. At the end of case all needle counts sponge counts were correct.  History of present illness:

## 2015-07-22 NOTE — H&P (Signed)
Sara Cross is an 59 y.o. female.   Chief Complaint: Back pain HPI: Patient is a pleasant 59 year old female has previously undergone. Lumbar fusion and developed worsening of progressive low back pain workup revealed loosening of her S1 screws and pseudoarthrosis at L5-S1. So due to failure conservative treatment imaging findings and progression of clinical syndrome I recommended reexploration removal of hardware redo L5-S1 fusion with sacral alar and iliac screws. I've extensively gone over the risks and benefits of the procedure the patient as well as perioperative course expectations of outcome and alternatives surgery and she understands and agrees to proceed forward.  Past Medical History  Diagnosis Date  . Depression   . Pneumonia     hx of  . Arthritis   . Morbid obesity ([place])   . Anxiety   . Sleep apnea     wear cpap nightly  . GERD (gastroesophageal reflux disease)   . Headache(784.0)   . Dizziness   . DDD (degenerative disc disease)   . Hypertension   . Hyperlipidemia     "under control"  . Diabetes mellitus     cbg fasting 170-200  . Family history of adverse reaction to anesthesia     "mother gets really sick"  . Heart murmur     as a child  . History of bronchitis   . Numbness and tingling of both legs   . Numbness and tingling in left arm     Past Surgical History  Procedure Laterality Date  . Back surgery      01/2011  . Hand surgery      right   2011    FOR INJURY   . Cesearean      X2  . Toenail excision      BIG TOE RIGHT FOOT  . Toe nail removal      right great toe  . Colonoscopy    . Anterior cervical decomp/discectomy fusion  12/23/2011    Procedure: ANTERIOR CERVICAL DECOMPRESSION/DISCECTOMY FUSION 2 LEVELS;  Surgeon: Elaina Hoops, MD;  Location: [place] [facility];  Service: Neurosurgery;  Laterality: N/A;  Anterior Cervical Four-Five/Five-Six Decompression and Fusion  . Carpal tunnel release  12/23/2011    Procedure: CARPAL TUNNEL RELEASE;  Surgeon:  Elaina Hoops, MD;  Location: [place] [facility];  Service: Neurosurgery;  Laterality: Left;  Left Carpal Tunnel Release  . Anterior cervical decomp/discectomy fusion N/A 06/07/2013    Procedure: ANTERIOR CERVICAL DECOMPRESSION/DISCECTOMY FUSION 1 LEVEL/HARDWARE REMOVAL;  Surgeon: Elaina Hoops, MD;  Location: [place] [facility];  Service: Neurosurgery;  Laterality: N/A;  ANTERIOR CERVICAL DECOMPRESSION/DISCECTOMY FUSION 1 LEVEL/HARDWARE REMOVAL  . Lumbar laminectomy/decompression microdiscectomy N/A 08/29/2014    Procedure: Thoracic seven-eight  microdiskectomy;  Surgeon: Elaina Hoops, MD;  Location: [place] [facility];  Service: Neurosurgery;  Laterality: N/A;  . Breast reduction surgery Bilateral 04/2015  . Abdominal hysterectomy  2004    partial    Family History  Problem Relation Age of Onset  . Anesthesia problems Neg Hx   . Hypotension Neg Hx   . Malignant hyperthermia Neg Hx   . Pseudochol deficiency Neg Hx   . Seizures Neg Hx   . Cancer Father     ORAL  . Hypertension Other   . Hyperlipidemia Other   . Sleep apnea Other   . Obesity Other   . Arthritis Mother   . Diabetes Sister   . Hyperlipidemia Sister   . Diabetes Maternal Grandmother   . Hypertension Maternal Grandmother   .  Hyperlipidemia Maternal Grandmother   . Heart disease Maternal Grandmother   . Cancer Maternal Grandmother     stomach  . Cancer Paternal Grandfather     liver   Social History:  reports that she quit smoking about a year ago. Her smoking use included Cigarettes. She has a 7.5 pack-year smoking history. She has never used smokeless tobacco. She reports that she drinks alcohol. She reports that she does not use illicit drugs.  Allergies:  Allergies  Allergen Reactions  . Benicar [Olmesartan] Other (See Comments)  . Ziac [Bisoprolol-Hydrochlorothiazide] Other (See Comments)  . Lortab [Hydrocodone-Acetaminophen] Itching    Medications Prior to Admission  Medication Sig Dispense Refill  . alendronate (FOSAMAX) 70 MG  tablet Take 1 tablet (70 mg total) by mouth once a week. Take with a full glass of water on an empty stomach. 12 tablet 3  . buPROPion (WELLBUTRIN XL) 300 MG 24 hr tablet TAKE ONE TABLET BY MOUTH ONCE DAILY 30 tablet 3  . Cholecalciferol (VITAMIN D PO) Take 10,000 Units by mouth daily.     . furosemide (LASIX) 20 MG tablet TAKE TWO TABLETS BY MOUTH ONCE DAILY 180 tablet 0  . lisinopril (PRINIVIL,ZESTRIL) 20 MG tablet TAKE ONE TABLET BY MOUTH ONCE DAILY 90 tablet 0  . lisinopril (PRINIVIL,ZESTRIL) 40 MG tablet TAKE ONE TABLET BY MOUTH ONCE DAILY (Patient taking differently: Take 20 mg by mouth daily. TAKE ONE TABLET BY MOUTH ONCE DAILY) 30 tablet 5  . metFORMIN (GLUCOPHAGE-XR) 500 MG 24 hr tablet TAKE TWO TABLETS BY MOUTH TWICE DAILY 360 tablet 5  . omeprazole (PRILOSEC) 20 MG capsule Take 1 capsule (20 mg total) by mouth daily. 90 capsule 1  . oxyCODONE-acetaminophen (PERCOCET/ROXICET) 5-325 MG per tablet Take 1 tablet by mouth every 6 (six) hours as needed for pain. 60 tablet 0  . vitamin B-12 (CYANOCOBALAMIN) 1000 MCG tablet Take 1,000 mcg by mouth daily.      No results found for this or any previous visit (from the past 48 hour(s)). No results found.  Review of Systems  Constitutional: Negative.   HENT: Negative.   Eyes: Negative.   Respiratory: Negative.   Cardiovascular: Negative.   Gastrointestinal: Negative.   Genitourinary: Negative.   Musculoskeletal: Positive for myalgias and back pain.  Skin: Negative.   Psychiatric/Behavioral: Negative.     Blood pressure 122/56, pulse 88, temperature 98 F (36.7 C), temperature source Oral, resp. rate 18, weight 89.812 kg (198 lb), SpO2 97 %. Physical Exam  Constitutional: She is oriented to person, place, and time. She appears well-developed and well-nourished.  HENT:  Head: Normocephalic.  Eyes: Pupils are equal, round, and reactive to light.  Neck: Normal range of motion.  Respiratory: Effort normal.  GI: Soft.  Neurological:  She is alert and oriented to person, place, and time. She has normal strength. GCS eye subscore is 4. GCS verbal subscore is 5. GCS motor subscore is 6.  Strength is 5 out of 5 in her iliopsoas, quads, his gastric and into tibialis, and EHL.  Skin: Skin is warm and dry.     Assessment/Plan 59 year old female presents for revision of fusion L5-S1  Vasily Fedewa P 07/22/2015, 12:01 PM

## 2015-07-22 NOTE — Transfer of Care (Signed)
Immediate Anesthesia Transfer of Care Note  Patient: Sara Cross  Procedure(s) Performed: Procedure(s):  POSTERIOR LATERAL FUSION WITH ILIAC SCREWS LUMBAR FIVE-SACRAL ONE (N/A) APPLICATION OF INTRAOPERATIVE CT SCAN (N/A)  Patient Location: PACU  Anesthesia Type:General  Level of Consciousness: awake, alert  and oriented  Airway & Oxygen Therapy: Patient Spontanous Breathing and Patient connected to nasal cannula oxygen  Post-op Assessment: Report given to RN, Post -op Vital signs reviewed and stable and Patient moving all extremities  Post vital signs: Reviewed and stable  Last Vitals:  Filed Vitals:   07/22/15 1052  BP: 122/56  Pulse: 88  Temp: 36.7 C  Resp: 18    Complications: No apparent anesthesia complications

## 2015-07-22 NOTE — Anesthesia Procedure Notes (Signed)
Procedure Name: Intubation Date/Time: 07/22/2015 1:09 PM Performed by: Trixie Deis A Pre-anesthesia Checklist: Patient identified, Timeout performed, Emergency Drugs available, Suction available and Patient being monitored Patient Re-evaluated:Patient Re-evaluated prior to inductionOxygen Delivery Method: Circle system utilized Preoxygenation: Pre-oxygenation with 100% oxygen Intubation Type: IV induction Ventilation: Mask ventilation without difficulty Laryngoscope Size: Mac and 3 Grade View: Grade I Tube type: Oral Tube size: 7.0 mm Number of attempts: 1 Airway Equipment and Method: Stylet Placement Confirmation: ETT inserted through vocal cords under direct vision,  breath sounds checked- equal and bilateral and positive ETCO2 Secured at: 21 cm Tube secured with: Tape Dental Injury: Teeth and Oropharynx as per pre-operative assessment

## 2015-07-22 NOTE — Anesthesia Preprocedure Evaluation (Addendum)
Anesthesia Evaluation  Patient identified by MRN, date of birth, ID band Patient awake    Reviewed: Allergy & Precautions, NPO status , Patient's Chart, lab work & pertinent test results  Airway Mallampati: I  TM Distance: >3 FB Neck ROM: Full    Dental no notable dental hx.  Front venires:   Pulmonary sleep apnea , former smoker,    Pulmonary exam normal breath sounds clear to auscultation       Cardiovascular hypertension, Pt. on medications Normal cardiovascular exam Rhythm:Regular Rate:Normal     Neuro/Psych  Headaches,    GI/Hepatic GERD  ,  Endo/Other  diabetes, Type 2, Oral Hypoglycemic Agents  Renal/GU      Musculoskeletal  (+) Arthritis ,   Abdominal   Peds  Hematology   Anesthesia Other Findings   Reproductive/Obstetrics                            Anesthesia Physical  Anesthesia Plan  ASA: III  Anesthesia Plan: General   Post-op Pain Management:    Induction: Intravenous  Airway Management Planned: Oral ETT  Additional Equipment:   Intra-op Plan:   Post-operative Plan: Extubation in OR  Informed Consent: I have reviewed the patients History and Physical, chart, labs and discussed the procedure including the risks, benefits and alternatives for the proposed anesthesia with the patient or authorized representative who has indicated his/her understanding and acceptance.     Plan Discussed with: CRNA, Anesthesiologist and Surgeon  Anesthesia Plan Comments:         Anesthesia Quick Evaluation

## 2015-07-23 ENCOUNTER — Encounter (HOSPITAL_COMMUNITY): Payer: Self-pay | Admitting: Neurosurgery

## 2015-07-23 LAB — GLUCOSE, CAPILLARY
GLUCOSE-CAPILLARY: 226 mg/dL — AB (ref 65–99)
Glucose-Capillary: 142 mg/dL — ABNORMAL HIGH (ref 65–99)
Glucose-Capillary: 187 mg/dL — ABNORMAL HIGH (ref 65–99)

## 2015-07-23 NOTE — Progress Notes (Signed)
RT filled cpap humidifier with sterile water. Patient stated she would put the mask on herself when she is ready to go to sleep. RT notified patient to call if she needs assistance.

## 2015-07-23 NOTE — Progress Notes (Signed)
Occupational Therapy Evaluation Patient Details Name: Sara Cross MRN: [id number] DOB: 11/10/1955 Today's Date: 07/23/2015    History of Present Illness 59 y.o. female s/p reexpansion of L5-S1 fusion. PMH significant for DM, arthritis, HTN, heart murmur, GERD, anxiety, depression, sleep apnea, dizziness, numbness/tingling in both legs and L arm.    Clinical Impression   PTA, pt was independent with ADLs and functional mobility. Currently, pt requires min guard assist for ADLs, functional transfers, and ambulation. Provided min verbal cues for safe use of DME and to adhere to back precautions. Recommend No OT follow up as pt will have good familly support and assist at home. No DME needs at this time, pt has needed equipment at home.    Follow Up Recommendations  No OT follow up;Supervision - Intermittent    Equipment Recommendations  None recommended by OT    Recommendations for Other Services       Precautions / Restrictions Precautions Precautions: Back Precaution Booklet Issued: Yes (comment) Required Braces or Orthoses: Spinal Brace Spinal Brace: Lumbar corset;Applied in sitting position Restrictions Weight Bearing Restrictions: No      Mobility Bed Mobility Overal bed mobility: Needs Assistance Bed Mobility: Rolling;Sit to Sidelying Rolling: Min guard       Sit to sidelying: Min guard General bed mobility comments: HOB flat, used of bedrails. Verbal cues for log roll technique  Transfers Overall transfer level: Needs assistance Equipment used: Rolling walker (2 wheeled) Transfers: Sit to/from Stand Sit to Stand: Min guard         General transfer comment: Min guard for safety. Min verbal cues for safe hand placement on seated surface    Balance Overall balance assessment: Needs assistance Sitting-balance support: No upper extremity supported;Feet supported Sitting balance-Leahy Scale: Fair     Standing balance support: Bilateral upper extremity  supported;During functional activity Standing balance-Leahy Scale: Fair                              ADL Overall ADL's : Needs assistance/impaired     Grooming: Wash/dry hands;Min guard;Cueing for compensatory techniques;Adhering to UE precautions;Standing           Upper Body Dressing : Supervision/safety;Sitting Upper Body Dressing Details (indicate cue type and reason): Don/doff back brace only     Toilet Transfer: Min guard;Cueing for safety;Ambulation;RW;BSC Toilet Transfer Details (indicate cue type and reason): Verbal cues to avoid twisting Toileting- Clothing Manipulation and Hygiene: Min guard;Cueing for back precautions;Sit to/from stand       Functional mobility during ADLs: Min guard;Rolling walker General ADL Comments: Completed functional transfers and ambulation ~100 ft with min guard assist. Min verbal cues to adhere to back precautions (mainly avoid bending and twisting) during self-care tasks. Educated pt and husband on compensatory strategies for oral care, pericare, and LB ADLs.      Vision Vision Assessment?: No apparent visual deficits   Perception     Praxis      Pertinent Vitals/Pain Pain Assessment: 0-10 Pain Score: 6  Pain Location: back Pain Descriptors / Indicators: Burning;Operative site guarding Pain Intervention(s): Limited activity within patient's tolerance;Monitored during session;Repositioned     Hand Dominance Right   Extremity/Trunk Assessment Upper Extremity Assessment Upper Extremity Assessment: Overall WFL for tasks assessed   Lower Extremity Assessment Lower Extremity Assessment: Defer to PT evaluation   Cervical / Trunk Assessment Cervical / Trunk Assessment: Normal   Communication Communication Communication: No difficulties   Cognition Arousal/Alertness: Awake/alert Behavior  During Therapy: WFL for tasks assessed/performed Overall Cognitive Status: Within Functional Limits for tasks assessed                      General Comments       Exercises       Shoulder Instructions      Home Living Family/patient expects to be discharged to:: Private residence Living Arrangements: Spouse/significant other;Children Available Help at Discharge: Family;Available 24 hours/day Type of Home: House Home Access: Stairs to enter [employer] of Steps: 2 Entrance Stairs-Rails: Right;Left Home Layout: One level     Bathroom Shower/Tub: Walk-in shower;Curtain   Bathroom Toilet: Handicapped height     Home Equipment: Environmental consultant - 2 wheels;Cane - single point;Grab bars - tub/shower;Hand held shower head;Bedside commode   Additional Comments: Pt husband will be available until Monday when he will go back to work - pt's daughter will be available to assist after that      Prior Functioning/Environment Level of Independence: Independent        Comments: Still driving, does not work    OT Diagnosis: Acute pain   OT Problem List: Decreased strength;Decreased range of motion;Decreased activity tolerance;Impaired balance (sitting and/or standing);Decreased coordination;Decreased safety awareness;Decreased knowledge of use of DME or AE;Decreased knowledge of precautions;Impaired sensation;Pain   OT Treatment/Interventions: Self-care/ADL training;Therapeutic exercise;DME and/or AE instruction;Therapeutic activities;Patient/family education;Balance training    OT Goals(Current goals can be found in the care plan section) Acute Rehab OT Goals Patient Stated Goal: to go home, decrease pain OT Goal Formulation: With patient Time For Goal Achievement: 08/06/15 Potential to Achieve Goals: Good ADL Goals Pt Will Perform Lower Body Bathing: with adaptive equipment;sitting/lateral leans;sit to/from stand;with modified independence Pt Will Perform Lower Body Dressing: with adaptive equipment;sitting/lateral leans;sit to/from stand;with modified independence Pt Will Transfer to Toilet:  ambulating;bedside commode;with modified independence Pt Will Perform Toileting - Clothing Manipulation and hygiene: with modified independence;with adaptive equipment;sitting/lateral leans;sit to/from stand Pt Will Perform Tub/Shower Transfer: ambulating;3 in 1;rolling walker;grab bars;with modified independence Additional ADL Goal #1: Pt will demonstrate understanding of 3/3 back precautions to increase safety with ADLs. Additional ADL Goal #2: Pt will independently don/doff back brace to increase independence with ADLs.  OT Frequency: Min 2X/week   Barriers to D/C:            Co-evaluation              End of Session Equipment Utilized During Treatment: Gait belt;Rolling walker;Back brace Nurse Communication: Mobility status;Precautions;Other (comment) (hemovac not suctioning)  Activity Tolerance: Patient tolerated treatment well Patient left: in bed;with call bell/phone within reach;with bed alarm set;with family/visitor present   Time: 1022-1050 OT Time Calculation (min): 28 min Charges:  OT General Charges $OT Visit: 1 Procedure OT Evaluation $Initial OT Evaluation Tier I: 1 Procedure OT Treatments $Self Care/Home Management : 8-22 mins G-Codes:    Redmond Baseman, OTR/L 08-18-15, 11:15 AM

## 2015-07-23 NOTE — Evaluation (Signed)
Physical Therapy Evaluation Patient Details Name: Sara Cross MRN: [id number] DOB: 10/15/1955 Today's Date: 07/23/2015   History of Present Illness  Patient is a very pleasant 59 year old female. C undergone lumbar fusion we'll progress worsening back pain was noted on imaging to have loosening of her S1 screws and a pseudoarthrosis at L5-S1. Due to her failure conservative treatment imaging findings and progressive clinical syndrome I recommended reexpansion of fusion removal of hardware and redo L5-S1 fusion utilizing iliac screws and sacral alar screws. I extensively went over the risks and benefits of the operation as well as perioperative course expectations of outcome and alternatives to surgery and she understood and agreed to proceed forward.  Clinical Impression  Patient is s/p above surgery resulting in the deficits listed below (see PT Problem List).  Patient will benefit from skilled PT to increase their independence and safety with mobility (while adhering to their precautions) to allow discharge to the venue listed below.     Follow Up Recommendations Outpatient PT;Supervision/Assistance - 24 hour    Equipment Recommendations  None recommended by PT    Recommendations for Other Services       Precautions / Restrictions Precautions Precautions: Back Required Braces or Orthoses: Spinal Brace Spinal Brace: Lumbar corset;Applied in sitting position      Mobility  Bed Mobility Overal bed mobility: Modified Independent             General bed mobility comments: use of bed rail and HOB slightly elevated  Transfers Overall transfer level: Needs assistance Equipment used: Rolling walker (2 wheeled) Transfers: Sit to/from Stand Sit to Stand: Min guard         General transfer comment: min cues for hand placement  Ambulation/Gait Ambulation/Gait assistance: Min guard Ambulation Distance (Feet): 130 Feet Assistive device: Rolling walker (2 wheeled) Gait  Pattern/deviations: Step-to pattern Gait velocity: decreased Gait velocity interpretation: Below normal speed for age/gender    Stairs            Wheelchair Mobility    Modified Rankin (Stroke Patients Only)       Balance                                             Pertinent Vitals/Pain Pain Assessment: 0-10 Pain Score: 7  Pain Location: Back Pain Intervention(s): Monitored during session;Repositioned;Premedicated before session    Home Living Family/patient expects to be discharged to:: Private residence Living Arrangements: Spouse/significant other;Children Available Help at Discharge: Family;Available 24 hours/day Type of Home: House Home Access: Stairs to enter Entrance Stairs-Rails: Psychiatric nurse of Steps: 2 Home Layout: One level Home Equipment: Walker - 2 wheels      Prior Function Level of Independence: Independent         Comments: Still driving, 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, newspaper        Extremity/Trunk Assessment               Lower Extremity Assessment: Overall WFL for tasks assessed         Communication   Communication: No difficulties  Cognition Arousal/Alertness: Awake/alert                          General Comments      Exercises        Assessment/Plan    PT Assessment Patient  needs continued PT services  PT Diagnosis Difficulty walking;Acute pain   PT Problem List Decreased activity tolerance;Decreased mobility;Decreased knowledge of use of DME;Decreased knowledge of precautions;Pain  PT Treatment Interventions DME instruction;Gait training;Stair training;Functional mobility training;Therapeutic activities;Therapeutic exercise;Patient/family education   PT Goals (Current goals can be found in the Care Plan section) Acute Rehab PT Goals Patient Stated Goal: to go home, decrease pain PT Goal Formulation: With patient/family Time For Goal Achievement:  07/30/15 Potential to Achieve Goals: Good    Frequency Min 5X/week   Barriers to discharge        Co-evaluation               End of Session Equipment Utilized During Treatment: Gait belt Activity Tolerance: Patient tolerated treatment well Patient left: in chair;with call bell/phone within reach;with chair alarm set;with family/visitor present Nurse Communication: Mobility status         Time: 0830-0903 PT Time Calculation (min) (ACUTE ONLY): 33 min   Charges:   PT Evaluation $Initial PT Evaluation Tier I: 1 Procedure PT Treatments $Gait Training: 8-22 mins $Therapeutic Activity: 8-22 mins   PT G Codes:       Laureen Abrahams, PT, DPT [phone] 07/23/2015 9:16 AM

## 2015-07-23 NOTE — Progress Notes (Signed)
Subjective: Patient reports No leg pain condition of back pain  Objective: Vital signs in last 24 hours: Temp:  [98 F (36.7 C)-99.6 F (37.6 C)] 99.6 F (37.6 C) (12/08 0552) Pulse Rate:  [84-102] 93 (12/08 0552) Resp:  [9-18] 18 (12/08 0552) BP: (92-134)/(51-82) 108/65 mmHg (12/08 0552) SpO2:  [93 %-100 %] 97 % (12/08 0552) FiO2 (%):  [21 %] 21 % (12/08 0056) Weight:  [89.8 kg (197 lb 15.6 oz)-89.812 kg (198 lb)] 89.8 kg (197 lb 15.6 oz) (12/07 2025)  Intake/Output from previous day: 12/07 0701 - 12/08 0700 In: 2520 [P.O.:120; I.V.:2400] Out: 1800 [Urine:1400; Drains:200; Blood:200] Intake/Output this shift:    Strength 5 out of 5 wound clean dry and intact  Lab Results: No results for input(s): WBC, HGB, HCT, PLT in the last 72 hours. BMET No results for input(s): NA, K, CL, CO2, GLUCOSE, BUN, CREATININE, CALCIUM in the last 72 hours.  Studies/Results: Dg Ct Oarm Limited Study  07/22/2015  CLINICAL DATA:  Intra-op CT was utilized by requesting physician. No radiographic interpretation.    Assessment/Plan: Progressive mobilization today with physical and occupational therapy  LOS: 1 day     Sara Cross P 07/23/2015, 8:14 AM

## 2015-07-23 NOTE — Care Management Note (Signed)
Case Management Note  Patient Details  Name: Sara Cross MRN: [id number] Date of Birth: 10-28-1955  Subjective/Objective:                    Action/Plan: Patient was admitted for a reexploration removal of hardware redo L5-S1 fusion with sacral alar and iliac screws. Lives at home with spouse. Will follow for discharge needs pending PT/OT evals and physician orders.  Expected Discharge Date:                  Expected Discharge Plan:  Home/Self Care  In-House Referral:     Discharge planning Services  CM Consult  Post Acute Care Choice:    Choice offered to:     DME Arranged:    DME Agency:     HH Arranged:    HH Agency:     Status of Service:  In process, will continue to follow  Medicare Important Message Given:    Date Medicare IM Given:    Medicare IM give by:    Date Additional Medicare IM Given:    Additional Medicare Important Message give by:     If discussed at [place] of Stay Meetings, dates discussed:    Additional Comments:  Rolm Baptise, RN 07/23/2015, 10:14 AM

## 2015-07-23 NOTE — Progress Notes (Signed)
Utilization review completed.  

## 2015-07-24 LAB — GLUCOSE, CAPILLARY
GLUCOSE-CAPILLARY: 162 mg/dL — AB (ref 65–99)
Glucose-Capillary: 146 mg/dL — ABNORMAL HIGH (ref 65–99)

## 2015-07-24 MED ORDER — OXYCODONE-ACETAMINOPHEN 5-325 MG PO TABS: 1.0000 | ORAL_TABLET | Freq: Four times a day (QID) | ORAL | Status: DC | PRN

## 2015-07-24 MED FILL — Heparin Sodium (Porcine) Inj 1000 Unit/ML: INTRAMUSCULAR | Qty: 30 | Status: AC

## 2015-07-24 MED FILL — Sodium Chloride IV Soln 0.9%: INTRAVENOUS | Qty: 1000 | Status: AC

## 2015-07-24 NOTE — Progress Notes (Addendum)
OT Cancellation Note  Patient Details Name: ADIVA SKLAR MRN: [id number] DOB: May 09, 1956   Cancelled Treatment:       Janice Coffin 07/24/2015, 9:41 AM  Attempted skilled OT with pt.  Spouse present also.  Both report they do not need to review or demo any toileting or tub transfers.  Declined need for LB adls as spouse states he will assist.  Report she has has 5 previous sx. And has no questions or concerns related to OT goals.  Only wants to practice the stairs with PT then feels she is clear for d/c home after that.  Will alert OTR/l pts. Request for no OT.    Romana Juniper, COTA/L

## 2015-07-24 NOTE — Progress Notes (Signed)
Physical Therapy Treatment Patient Details Name: Sara Cross MRN: [id number] DOB: 08/23/1955 Today's Date: 07/24/2015    History of Present Illness 59 y.o. female s/p reexpansion of L5-S1 fusion. PMH significant for DM, arthritis, HTN, heart murmur, GERD, anxiety, depression, sleep apnea, dizziness, numbness/tingling in both legs and L arm.     PT Comments    Progressing well. Answered all mobility questions. Anticipate DC home today.  Advised to gradually increase level of activity. Pt states she knows she needs to use a RW at home if not holding onto her husband.   Follow Up Recommendations        Equipment Recommendations  None recommended by PT    Recommendations for Other Services       Precautions / Restrictions Precautions Precautions: Back Precaution Booklet Issued: Yes (comment) Required Braces or Orthoses: Spinal Brace Spinal Brace: Lumbar corset;Applied in sitting position Restrictions Weight Bearing Restrictions: No    Mobility  Bed Mobility Overal bed mobility: Modified Independent Bed Mobility: Rolling;Supine to Sit Rolling: Modified independent (Device/Increase time)   Supine to sit: Modified independent (Device/Increase time)        Transfers Overall transfer level: Needs assistance   Transfers: Sit to/from Stand Sit to Stand: Supervision         General transfer comment: demonstrated safe technique  Ambulation/Gait Ambulation/Gait assistance: Min assist Ambulation Distance (Feet): 100 Feet Assistive device: 1 person hand held assist Gait Pattern/deviations: Step-through pattern Gait velocity: decreased Gait velocity interpretation: Below normal speed for age/gender     Stairs Stairs: Yes Stairs assistance: Min guard Stair Management: One rail Left;Sideways;Step to pattern Number of Stairs: 3 General stair comments: instructed in safest technique  Wheelchair Mobility    Modified Rankin (Stroke Patients Only)       Balance                                     Cognition Arousal/Alertness: Awake/alert Behavior During Therapy: WFL for tasks assessed/performed Overall Cognitive Status: Within Functional Limits for tasks assessed                      Exercises      General Comments General comments (skin integrity, edema, etc.): Family present throughout session      Pertinent Vitals/Pain Pain Assessment: 0-10 Pain Score: 10-Worst pain ever Pain Location: back Pain Descriptors / Indicators: Burning Pain Intervention(s): Limited activity within patient's tolerance;Premedicated before session;Other (comment) (Pt reports pain worse with mobility)    Home Living                      Prior Function            PT Goals (current goals can now be found in the care plan section) Acute Rehab PT Goals Patient Stated Goal: to go home today Progress towards PT goals: Progressing toward goals    Frequency  Min 5X/week    PT Plan Current plan remains appropriate    Co-evaluation             End of Session Equipment Utilized During Treatment: Gait belt Activity Tolerance: Patient tolerated treatment well Patient left: in bed;with call bell/phone within reach;with family/visitor present     Time: 1120-1136 PT Time Calculation (min) (ACUTE ONLY): 16 min  Charges:  $Gait Training: 8-22 mins  G Codes:      Melvern Banker Aug 19, 2015, 11:56 AM  Lavonia Dana, PT  [phone] August 19, 2015

## 2015-07-24 NOTE — Discharge Instructions (Signed)
No lifting no bending no twisting no driving a riding a car unless she is coming back and forth to see me. Keep incision clean dry and intact. May remove the outer dressing in 2-3 days leave the Steri-Strips on and intact. Cover the Steri-Strips with saran wrap for showers only.

## 2015-07-24 NOTE — Progress Notes (Signed)
Patient discharged home with husbands care summary reviewed, IV removed and medications explained will have volunteer services take patient to their car, she is alert and not complaits of pain.         

## 2015-07-24 NOTE — Discharge Summary (Signed)
  Physician Discharge Summary  Patient ID: Sara Cross MRN: [id number] DOB/AGE: June 15, 1956 59 y.o.  Admit date: 07/22/2015 Discharge date: 07/24/2015  Admission Diagnoses: Pseudoarthrosis L5-S1  Discharge Diagnoses: Same Active Problems:   Pseudoarthrosis of lumbar spine   Discharged Condition: good  Hospital Course: Patient is admitted hospital underwent reexploration of fusion removal of hardware redo fusion with sacral alar and iliac screws posterior lateral arthrodesis L4-S1. Postop patient did very well with recovered in the floor on the floor was ambulating and voiding spontaneously stable for discharge on postoperative day 2.  Consults: Significant Diagnostic Studies: Treatments: Posterior lumbar revision of fusion stabilization L4 to ilium Discharge Exam: Blood pressure 109/62, pulse 97, temperature 98.1 F (36.7 C), temperature source Oral, resp. rate 18, height 5\' 3"  (1.6 m), weight 89.8 kg (197 lb 15.6 oz), SpO2 97 %. Strength out of 5 wound clean dry and intact  Disposition: Home     Medication List    TAKE these medications        alendronate 70 MG tablet  Commonly known as:  FOSAMAX  Take 1 tablet (70 mg total) by mouth once a week. Take with a full glass of water on an empty stomach.     buPROPion 300 MG 24 hr tablet  Commonly known as:  WELLBUTRIN XL  TAKE ONE TABLET BY MOUTH ONCE DAILY     furosemide 20 MG tablet  Commonly known as:  LASIX  TAKE TWO TABLETS BY MOUTH ONCE DAILY     lisinopril 40 MG tablet  Commonly known as:  PRINIVIL,ZESTRIL  TAKE ONE TABLET BY MOUTH ONCE DAILY     lisinopril 20 MG tablet  Commonly known as:  PRINIVIL,ZESTRIL  TAKE ONE TABLET BY MOUTH ONCE DAILY     metFORMIN 500 MG 24 hr tablet  Commonly known as:  GLUCOPHAGE-XR  TAKE TWO TABLETS BY MOUTH TWICE DAILY     omeprazole 20 MG capsule  Commonly known as:  PRILOSEC  Take 1 capsule (20 mg total) by mouth daily.     oxyCODONE-acetaminophen 5-325 MG tablet   Commonly known as:  PERCOCET/ROXICET  Take 1 tablet by mouth every 6 (six) hours as needed for pain.     oxyCODONE-acetaminophen 5-325 MG tablet  Commonly known as:  PERCOCET/ROXICET  Take 1 tablet by mouth every 6 (six) hours as needed for moderate pain.     vitamin B-12 1000 MCG tablet  Commonly known as:  CYANOCOBALAMIN  Take 1,000 mcg by mouth daily.     VITAMIN D PO  Take 10,000 Units by mouth daily.           Follow-up Information    Follow up with [facility] P, MD.   Specialty:  Neurosurgery   Contact information:   [address] [place] [address] [phone]       Signed: Elaina Hoops 07/24/2015, 7:30 AM

## 2015-07-24 NOTE — Progress Notes (Signed)
Patient ID: Sara Cross, female   DOB: Mar 05, 1956, 59 y.o.   MRN: [id number] Doing well back pain controlled ambulating and voiding  Neurologically stable   discharge home

## 2015-07-29 NOTE — Anesthesia Postprocedure Evaluation (Signed)
Anesthesia Post Note  Patient: Sara Cross  Procedure(s) Performed: Procedure(s) (LRB):  POSTERIOR LATERAL FUSION WITH ILIAC SCREWS LUMBAR FIVE-SACRAL ONE (N/A) APPLICATION OF INTRAOPERATIVE CT SCAN (N/A)  Patient location during evaluation: PACU Anesthesia Type: General Level of consciousness: awake, sedated and oriented Pain management: pain level controlled Vital Signs Assessment: post-procedure vital signs reviewed and stable Respiratory status: spontaneous breathing Cardiovascular status: stable Anesthetic complications: no    Last Vitals:  Filed Vitals:   07/24/15 0508 07/24/15 0957  BP: 109/62 108/61  Pulse: 97 97  Temp: 36.7 C 37.2 C  Resp: 18 18    Last Pain:  Filed Vitals:   07/24/15 0957  PainSc: [place]

## 2015-08-12 ENCOUNTER — Encounter (HOSPITAL_COMMUNITY): Payer: Self-pay | Admitting: Neurosurgery

## 2015-08-14 ENCOUNTER — Other Ambulatory Visit: Payer: Self-pay | Admitting: Internal Medicine

## 2015-08-31 ENCOUNTER — Other Ambulatory Visit: Payer: Self-pay | Admitting: Internal Medicine

## 2015-09-02 DIAGNOSIS — Z0000 Encounter for general adult medical examination without abnormal findings: Secondary | ICD-10-CM

## 2015-09-03 ENCOUNTER — Ambulatory Visit (INDEPENDENT_AMBULATORY_CARE_PROVIDER_SITE_OTHER): Payer: Self-pay | Admitting: Internal Medicine

## 2015-09-03 ENCOUNTER — Encounter: Payer: Self-pay | Admitting: Internal Medicine

## 2015-09-03 VITALS — BP 116/66 | HR 80 | Temp 97.9°F | Resp 16 | Ht 62.0 in | Wt 209.0 lb

## 2015-09-03 DIAGNOSIS — Z9189 Other specified personal risk factors, not elsewhere classified: Secondary | ICD-10-CM
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
## 2015-09-05 ENCOUNTER — Encounter: Payer: Self-pay | Admitting: *Deleted

## 2015-09-09 ENCOUNTER — Ambulatory Visit: Payer: Self-pay | Admitting: Internal Medicine

## 2015-10-05 ENCOUNTER — Other Ambulatory Visit: Payer: Self-pay | Admitting: Internal Medicine

## 2015-11-25 ENCOUNTER — Encounter: Payer: Self-pay | Admitting: Internal Medicine

## 2015-12-04 ENCOUNTER — Other Ambulatory Visit: Payer: Self-pay | Admitting: Physician Assistant

## 2015-12-09 ENCOUNTER — Other Ambulatory Visit: Payer: Self-pay | Admitting: Physician Assistant

## 2016-03-04 ENCOUNTER — Other Ambulatory Visit: Payer: Self-pay | Admitting: Family Medicine

## 2016-03-04 DIAGNOSIS — R928 Other abnormal and inconclusive findings on diagnostic imaging of breast: Secondary | ICD-10-CM

## 2016-03-15 ENCOUNTER — Other Ambulatory Visit: Payer: Self-pay | Admitting: Family Medicine

## 2016-03-15 ENCOUNTER — Ambulatory Visit
Admission: RE | Admit: 2016-03-15 | Discharge: 2016-03-15 | Disposition: A | Discharge status: Home / Self Care | Payer: Medicare Other | Source: Ambulatory Visit | Attending: Family Medicine | Admitting: Family Medicine

## 2016-03-15 DIAGNOSIS — R928 Other abnormal and inconclusive findings on diagnostic imaging of breast: Secondary | ICD-10-CM

## 2016-03-28 DIAGNOSIS — Z0000 Encounter for general adult medical examination without abnormal findings: Secondary | ICD-10-CM

## 2016-10-27 ENCOUNTER — Other Ambulatory Visit: Payer: Self-pay | Admitting: Student

## 2016-10-27 DIAGNOSIS — M5410 Radiculopathy, site unspecified: Secondary | ICD-10-CM

## 2016-11-02 ENCOUNTER — Other Ambulatory Visit: Payer: Medicare Other

## 2016-11-07 ENCOUNTER — Other Ambulatory Visit: Payer: Medicare Other

## 2016-11-08 ENCOUNTER — Other Ambulatory Visit: Payer: Self-pay | Admitting: Orthopedic Surgery

## 2016-11-09 ENCOUNTER — Ambulatory Visit
Admission: RE | Admit: 2016-11-09 | Discharge: 2016-11-09 | Disposition: A | Discharge status: Home / Self Care | Payer: Medicare Other | Source: Ambulatory Visit | Attending: Student | Admitting: Student

## 2016-11-09 DIAGNOSIS — M5410 Radiculopathy, site unspecified: Secondary | ICD-10-CM

## 2017-01-26 ENCOUNTER — Encounter (HOSPITAL_BASED_OUTPATIENT_CLINIC_OR_DEPARTMENT_OTHER): Payer: Self-pay | Admitting: *Deleted

## 2017-01-27 ENCOUNTER — Encounter (HOSPITAL_BASED_OUTPATIENT_CLINIC_OR_DEPARTMENT_OTHER): Payer: Self-pay | Admitting: *Deleted

## 2017-01-30 ENCOUNTER — Encounter (HOSPITAL_BASED_OUTPATIENT_CLINIC_OR_DEPARTMENT_OTHER)
Admission: RE | Admit: 2017-01-30 | Discharge: 2017-01-30 | Disposition: A | Discharge status: Home / Self Care | Payer: Medicare Other | Source: Ambulatory Visit | Attending: Orthopedic Surgery | Admitting: Orthopedic Surgery

## 2017-01-30 ENCOUNTER — Other Ambulatory Visit: Payer: Self-pay

## 2017-01-30 DIAGNOSIS — Z01812 Encounter for preprocedural laboratory examination: Secondary | ICD-10-CM | POA: Diagnosis present

## 2017-01-30 DIAGNOSIS — G4730 Sleep apnea, unspecified: Secondary | ICD-10-CM | POA: Insufficient documentation

## 2017-01-30 DIAGNOSIS — Z01810 Encounter for preprocedural cardiovascular examination: Secondary | ICD-10-CM | POA: Diagnosis present

## 2017-01-30 DIAGNOSIS — R011 Cardiac murmur, unspecified: Secondary | ICD-10-CM | POA: Diagnosis not present

## 2017-01-30 DIAGNOSIS — I10 Essential (primary) hypertension: Secondary | ICD-10-CM | POA: Diagnosis not present

## 2017-01-30 DIAGNOSIS — R200 Anesthesia of skin: Secondary | ICD-10-CM | POA: Insufficient documentation

## 2017-01-30 DIAGNOSIS — E119 Type 2 diabetes mellitus without complications: Secondary | ICD-10-CM | POA: Diagnosis not present

## 2017-01-30 DIAGNOSIS — K219 Gastro-esophageal reflux disease without esophagitis: Secondary | ICD-10-CM | POA: Insufficient documentation

## 2017-01-30 DIAGNOSIS — E785 Hyperlipidemia, unspecified: Secondary | ICD-10-CM | POA: Insufficient documentation

## 2017-01-30 DIAGNOSIS — Z8701 Personal history of pneumonia (recurrent): Secondary | ICD-10-CM | POA: Diagnosis not present

## 2017-01-30 LAB — BASIC METABOLIC PANEL
ANION GAP: 10 (ref 5–15)
BUN: 13 mg/dL (ref 6–20)
CALCIUM: 9.3 mg/dL (ref 8.9–10.3)
CO2: 24 mmol/L (ref 22–32)
CREATININE: 0.72 mg/dL (ref 0.44–1.00)
Chloride: 100 mmol/L — ABNORMAL LOW (ref 101–111)
Glucose, Bld: 152 mg/dL — ABNORMAL HIGH (ref 65–99)
Potassium: 4.9 mmol/L (ref 3.5–5.1)
SODIUM: 134 mmol/L — AB (ref 135–145)

## 2017-02-16 ENCOUNTER — Encounter (HOSPITAL_BASED_OUTPATIENT_CLINIC_OR_DEPARTMENT_OTHER): Payer: Self-pay | Admitting: *Deleted

## 2017-02-16 ENCOUNTER — Ambulatory Visit (HOSPITAL_BASED_OUTPATIENT_CLINIC_OR_DEPARTMENT_OTHER): Payer: Medicare Other | Admitting: Anesthesiology

## 2017-02-16 ENCOUNTER — Encounter (HOSPITAL_BASED_OUTPATIENT_CLINIC_OR_DEPARTMENT_OTHER)
Admission: RE | Disposition: A | Discharge status: Home / Self Care | Payer: Self-pay | Source: Ambulatory Visit | Attending: Orthopedic Surgery

## 2017-02-16 ENCOUNTER — Ambulatory Visit (HOSPITAL_BASED_OUTPATIENT_CLINIC_OR_DEPARTMENT_OTHER)
Admission: RE | Admit: 2017-02-16 | Discharge: 2017-02-16 | Disposition: A | Discharge status: Home / Self Care | Payer: Medicare Other | Source: Ambulatory Visit | Attending: Orthopedic Surgery | Admitting: Orthopedic Surgery

## 2017-02-16 DIAGNOSIS — Z888 Allergy status to other drugs, medicaments and biological substances status: Secondary | ICD-10-CM | POA: Diagnosis not present

## 2017-02-16 DIAGNOSIS — F329 Major depressive disorder, single episode, unspecified: Secondary | ICD-10-CM | POA: Diagnosis not present

## 2017-02-16 DIAGNOSIS — Z6837 Body mass index (BMI) 37.0-37.9, adult: Secondary | ICD-10-CM | POA: Diagnosis not present

## 2017-02-16 DIAGNOSIS — I10 Essential (primary) hypertension: Secondary | ICD-10-CM | POA: Diagnosis not present

## 2017-02-16 DIAGNOSIS — E785 Hyperlipidemia, unspecified: Secondary | ICD-10-CM | POA: Insufficient documentation

## 2017-02-16 DIAGNOSIS — K219 Gastro-esophageal reflux disease without esophagitis: Secondary | ICD-10-CM | POA: Insufficient documentation

## 2017-02-16 DIAGNOSIS — Z981 Arthrodesis status: Secondary | ICD-10-CM | POA: Diagnosis not present

## 2017-02-16 DIAGNOSIS — Z8249 Family history of ischemic heart disease and other diseases of the circulatory system: Secondary | ICD-10-CM | POA: Insufficient documentation

## 2017-02-16 DIAGNOSIS — Z9989 Dependence on other enabling machines and devices: Secondary | ICD-10-CM | POA: Diagnosis not present

## 2017-02-16 DIAGNOSIS — G5601 Carpal tunnel syndrome, right upper limb: Secondary | ICD-10-CM | POA: Diagnosis not present

## 2017-02-16 DIAGNOSIS — M1990 Unspecified osteoarthritis, unspecified site: Secondary | ICD-10-CM | POA: Insufficient documentation

## 2017-02-16 DIAGNOSIS — F419 Anxiety disorder, unspecified: Secondary | ICD-10-CM | POA: Insufficient documentation

## 2017-02-16 DIAGNOSIS — Z90710 Acquired absence of both cervix and uterus: Secondary | ICD-10-CM | POA: Diagnosis not present

## 2017-02-16 DIAGNOSIS — Z87891 Personal history of nicotine dependence: Secondary | ICD-10-CM | POA: Diagnosis not present

## 2017-02-16 DIAGNOSIS — Z885 Allergy status to narcotic agent status: Secondary | ICD-10-CM | POA: Insufficient documentation

## 2017-02-16 DIAGNOSIS — G4730 Sleep apnea, unspecified: Secondary | ICD-10-CM | POA: Insufficient documentation

## 2017-02-16 DIAGNOSIS — E119 Type 2 diabetes mellitus without complications: Secondary | ICD-10-CM | POA: Diagnosis not present

## 2017-02-16 HISTORY — PX: CARPAL TUNNEL RELEASE: SHX101

## 2017-02-16 LAB — GLUCOSE, CAPILLARY: Glucose-Capillary: 148 mg/dL — ABNORMAL HIGH (ref 65–99)

## 2017-02-16 SURGERY — CARPAL TUNNEL RELEASE
Anesthesia: Regional | Site: Wrist | Laterality: Right

## 2017-02-16 MED ORDER — ONDANSETRON HCL 4 MG/2ML IJ SOLN
INTRAMUSCULAR | Status: AC
  Filled 2017-02-16: qty 2

## 2017-02-16 MED ORDER — SCOPOLAMINE 1 MG/3DAYS TD PT72: 1.0000 | MEDICATED_PATCH | Freq: Once | TRANSDERMAL | Status: DC | PRN

## 2017-02-16 MED ORDER — PHENYLEPHRINE 40 MCG/ML (10ML) SYRINGE FOR IV PUSH (FOR BLOOD PRESSURE SUPPORT)
PREFILLED_SYRINGE | INTRAVENOUS | Status: AC
  Filled 2017-02-16: qty 10

## 2017-02-16 MED ORDER — PROMETHAZINE HCL 25 MG/ML IJ SOLN: 6.2500 mg | INTRAMUSCULAR | Status: DC | PRN

## 2017-02-16 MED ORDER — PROPOFOL 10 MG/ML IV BOLUS
INTRAVENOUS | Status: DC | PRN
  Administered 2017-02-16 (×2): 20 mg via INTRAVENOUS

## 2017-02-16 MED ORDER — MEPERIDINE HCL 25 MG/ML IJ SOLN: 6.2500 mg | INTRAMUSCULAR | Status: DC | PRN

## 2017-02-16 MED ORDER — CEFAZOLIN SODIUM-DEXTROSE 2-4 GM/100ML-% IV SOLN
INTRAVENOUS | Status: AC
  Filled 2017-02-16: qty 100

## 2017-02-16 MED ORDER — OXYCODONE HCL 5 MG PO TABS: 5.0000 mg | ORAL_TABLET | Freq: Once | ORAL | Status: DC | PRN

## 2017-02-16 MED ORDER — LIDOCAINE HCL (PF) 0.5 % IJ SOLN
INTRAMUSCULAR | Status: DC | PRN
  Administered 2017-02-16: 30 mL via INTRAVENOUS

## 2017-02-16 MED ORDER — ONDANSETRON HCL 4 MG/2ML IJ SOLN
INTRAMUSCULAR | Status: DC | PRN
  Administered 2017-02-16: 4 mg via INTRAVENOUS

## 2017-02-16 MED ORDER — OXYCODONE-ACETAMINOPHEN 5-325 MG PO TABS: ORAL_TABLET | ORAL | 0 refills | Status: DC

## 2017-02-16 MED ORDER — OXYCODONE HCL 5 MG/5ML PO SOLN: 5.0000 mg | Freq: Once | ORAL | Status: DC | PRN

## 2017-02-16 MED ORDER — FENTANYL CITRATE (PF) 100 MCG/2ML IJ SOLN
INTRAMUSCULAR | Status: AC
  Filled 2017-02-16: qty 2

## 2017-02-16 MED ORDER — BUPIVACAINE HCL (PF) 0.25 % IJ SOLN
INTRAMUSCULAR | Status: DC | PRN
  Administered 2017-02-16: 8.5 mL

## 2017-02-16 MED ORDER — FENTANYL CITRATE (PF) 100 MCG/2ML IJ SOLN: 25.0000 ug | INTRAMUSCULAR | Status: DC | PRN

## 2017-02-16 MED ORDER — CEFAZOLIN SODIUM-DEXTROSE 2-4 GM/100ML-% IV SOLN
2.0000 g | INTRAVENOUS | Status: AC
  Administered 2017-02-16: 2 g via INTRAVENOUS

## 2017-02-16 MED ORDER — CHLORHEXIDINE GLUCONATE 4 % EX LIQD: 60.0000 mL | Freq: Once | CUTANEOUS | Status: DC

## 2017-02-16 MED ORDER — MIDAZOLAM HCL 2 MG/2ML IJ SOLN
1.0000 mg | INTRAMUSCULAR | Status: DC | PRN
  Administered 2017-02-16: 2 mg via INTRAVENOUS

## 2017-02-16 MED ORDER — EPHEDRINE 5 MG/ML INJ
INTRAVENOUS | Status: AC
  Filled 2017-02-16: qty 10

## 2017-02-16 MED ORDER — SUCCINYLCHOLINE CHLORIDE 200 MG/10ML IV SOSY
PREFILLED_SYRINGE | INTRAVENOUS | Status: AC
  Filled 2017-02-16: qty 10

## 2017-02-16 MED ORDER — MIDAZOLAM HCL 2 MG/2ML IJ SOLN
INTRAMUSCULAR | Status: AC
  Filled 2017-02-16: qty 2

## 2017-02-16 MED ORDER — KETOROLAC TROMETHAMINE 30 MG/ML IJ SOLN: 30.0000 mg | Freq: Once | INTRAMUSCULAR | Status: DC | PRN

## 2017-02-16 MED ORDER — LACTATED RINGERS IV SOLN
INTRAVENOUS | Status: DC
  Administered 2017-02-16 (×2): 1 mL/h via INTRAVENOUS
  Administered 2017-02-16: 10:00:00 via INTRAVENOUS

## 2017-02-16 MED ORDER — LIDOCAINE HCL (CARDIAC) 20 MG/ML IV SOLN
INTRAVENOUS | Status: AC
  Filled 2017-02-16: qty 5

## 2017-02-16 MED ORDER — FENTANYL CITRATE (PF) 100 MCG/2ML IJ SOLN
50.0000 ug | INTRAMUSCULAR | Status: DC | PRN
  Administered 2017-02-16: 100 ug via INTRAVENOUS

## 2017-02-16 SURGICAL SUPPLY — 34 items
BANDAGE ACE 3X5.8 VEL STRL LF (GAUZE/BANDAGES/DRESSINGS) ×3 IMPLANT
BLADE SURG 15 STRL LF DISP TIS (BLADE) ×2 IMPLANT
BLADE SURG 15 STRL SS (BLADE) ×6
BNDG CMPR 9X4 STRL LF SNTH (GAUZE/BANDAGES/DRESSINGS)
BNDG ESMARK 4X9 LF (GAUZE/BANDAGES/DRESSINGS) IMPLANT
BNDG GAUZE ELAST 4 BULKY (GAUZE/BANDAGES/DRESSINGS) ×3 IMPLANT
CHLORAPREP W/TINT 26ML (MISCELLANEOUS) ×3 IMPLANT
CORD BIPOLAR FORCEPS 12FT (ELECTRODE) ×3 IMPLANT
COVER BACK TABLE 60X90IN (DRAPES) ×3 IMPLANT
COVER MAYO STAND STRL (DRAPES) ×3 IMPLANT
CUFF TOURNIQUET SINGLE 18IN (TOURNIQUET CUFF) ×3 IMPLANT
DRAPE EXTREMITY T 121X128X90 (DRAPE) ×3 IMPLANT
DRAPE SURG 17X23 STRL (DRAPES) ×3 IMPLANT
DRSG PAD ABDOMINAL 8X10 ST (GAUZE/BANDAGES/DRESSINGS) ×3 IMPLANT
GAUZE SPONGE 4X4 12PLY STRL (GAUZE/BANDAGES/DRESSINGS) ×3 IMPLANT
GAUZE XEROFORM 1X8 LF (GAUZE/BANDAGES/DRESSINGS) ×3 IMPLANT
GLOVE BIO SURGEON STRL SZ7.5 (GLOVE) ×3 IMPLANT
GLOVE BIOGEL PI IND STRL 8 (GLOVE) ×1 IMPLANT
GLOVE BIOGEL PI INDICATOR 8 (GLOVE) ×2
GOWN STRL REUS W/ TWL LRG LVL3 (GOWN DISPOSABLE) ×1 IMPLANT
GOWN STRL REUS W/TWL LRG LVL3 (GOWN DISPOSABLE) ×3
GOWN STRL REUS W/TWL XL LVL3 (GOWN DISPOSABLE) ×3 IMPLANT
NDL HYPO 25X1 1.5 SAFETY (NEEDLE) ×1 IMPLANT
NEEDLE HYPO 25X1 1.5 SAFETY (NEEDLE) ×3 IMPLANT
NS IRRIG 1000ML POUR BTL (IV SOLUTION) ×3 IMPLANT
PACK BASIN DAY SURGERY FS (CUSTOM PROCEDURE TRAY) ×3 IMPLANT
PADDING CAST ABS 4INX4YD NS (CAST SUPPLIES) ×2
PADDING CAST ABS COTTON 4X4 ST (CAST SUPPLIES) ×1 IMPLANT
STOCKINETTE 4X48 STRL (DRAPES) ×3 IMPLANT
SUT ETHILON 4 0 PS 2 18 (SUTURE) ×3 IMPLANT
SYR BULB 3OZ (MISCELLANEOUS) ×3 IMPLANT
SYR CONTROL 10ML LL (SYRINGE) ×3 IMPLANT
TOWEL OR 17X24 6PK STRL BLUE (TOWEL DISPOSABLE) ×6 IMPLANT
UNDERPAD 30X30 (UNDERPADS AND DIAPERS) ×3 IMPLANT

## 2017-02-16 NOTE — Anesthesia Preprocedure Evaluation (Signed)
Anesthesia Evaluation  Patient identified by MRN, date of birth, ID band Patient awake    Reviewed: Allergy & Precautions, NPO status , Patient's Chart, lab work & pertinent test results  Airway Mallampati: I  TM Distance: >3 FB Neck ROM: Full    Dental no notable dental hx.  Front venires:   Pulmonary sleep apnea , former smoker,    Pulmonary exam normal breath sounds clear to auscultation       Cardiovascular hypertension, Pt. on medications Normal cardiovascular exam+ Valvular Problems/Murmurs  Rhythm:Regular Rate:Normal     Neuro/Psych  Headaches,    GI/Hepatic GERD  ,  Endo/Other  diabetes, Type 2, Oral Hypoglycemic Agents  Renal/GU      Musculoskeletal  (+) Arthritis ,   Abdominal   Peds  Hematology   Anesthesia Other Findings   Reproductive/Obstetrics                             Anesthesia Physical  Anesthesia Plan  ASA: III  Anesthesia Plan: Bier Block   Post-op Pain Management:    Induction: Intravenous  PONV Risk Score and Plan: 2 and Ondansetron and Propofol  Airway Management Planned:   Additional Equipment:   Intra-op Plan:   Post-operative Plan:   Informed Consent: I have reviewed the patients History and Physical, chart, labs and discussed the procedure including the risks, benefits and alternatives for the proposed anesthesia with the patient or authorized representative who has indicated his/her understanding and acceptance.   Dental advisory given  Plan Discussed with: CRNA  Anesthesia Plan Comments:         Anesthesia Quick Evaluation

## 2017-02-16 NOTE — Brief Op Note (Signed)
02/16/2017  11:01 AM  PATIENT:  Sara Cross  61 y.o. female  PRE-OPERATIVE DIAGNOSIS:  right carpal tunnel syndrome G56.01  POST-OPERATIVE DIAGNOSIS:  right carpal tunnel syndrome  PROCEDURE:  Procedure(s): RIGHT CARPAL TUNNEL RELEASE (Right)  SURGEON:  Surgeon(s) and Role:    Leanora Cover, MD - Primary  PHYSICIAN ASSISTANT:   ASSISTANTS: none   ANESTHESIA:   Bier block with sedation  EBL:  Total I/O In: -  Out: 5 [Blood:5]  BLOOD ADMINISTERED:none  DRAINS: none   LOCAL MEDICATIONS USED:  MARCAINE     SPECIMEN:  No Specimen  DISPOSITION OF SPECIMEN:  N/A  COUNTS:  YES  TOURNIQUET:   Total Tourniquet Time Documented: Forearm (Right) - 30 minutes Total: Forearm (Right) - 30 minutes   DICTATION: .Note written in EPIC  PLAN OF CARE: Discharge to home after PACU  PATIENT DISPOSITION:  PACU - hemodynamically stable.

## 2017-02-16 NOTE — Anesthesia Postprocedure Evaluation (Signed)
Anesthesia Post Note  Patient: Sara Cross  Procedure(s) Performed: Procedure(s) (LRB): RIGHT CARPAL TUNNEL RELEASE (Right)     Patient location during evaluation: PACU Anesthesia Type: Bier Block Level of consciousness: awake and alert Pain management: pain level controlled Vital Signs Assessment: post-procedure vital signs reviewed and stable Respiratory status: spontaneous breathing Cardiovascular status: stable Anesthetic complications: no    Last Vitals:  Vitals:   02/16/17 1130 02/16/17 1145  BP: 99/60 130/68  Pulse: 77 76  Resp: 16 14  Temp:  36.4 C    Last Pain:  Vitals:   02/16/17 1145  TempSrc:   PainSc: 0-No pain                 Nolon Nations

## 2017-02-16 NOTE — Op Note (Signed)
02/16/2017 [facility]                              OPERATIVE REPORT   PREOPERATIVE DIAGNOSIS:  Right carpal tunnel syndrome.  POSTOPERATIVE DIAGNOSIS:  Right carpal tunnel syndrome.  PROCEDURE:  Right carpal tunnel release.  SURGEON:  Leanora Cover, MD  ASSISTANT:  none.  ANESTHESIA:  Bier block and sedation.  IV FLUIDS:  Per anesthesia flow sheet.  ESTIMATED BLOOD LOSS:  Minimal.  COMPLICATIONS:  None.  SPECIMENS:  None.  TOURNIQUET TIME:    Total Tourniquet Time Documented: Forearm (Right) - 30 minutes Total: Forearm (Right) - 30 minutes   DISPOSITION:  Stable to PACU.  LOCATION: [facility]  INDICATIONS:  61 yo female with decreased sensation in right hand. She wishes to have a carpal tunnel release for management of her symptoms.  Risks, benefits and alternatives of surgery were discussed including the risk of blood loss; infection; damage to nerves, vessels, tendons, ligaments, bone; failure of surgery; need for additional surgery; complications with wound healing; continued pain; recurrence of carpal tunnel syndrome; and damage to motor branch. She voiced understanding of these risks and elected to proceed.   OPERATIVE COURSE:  After being identified preoperatively by myself, the patient and I agreed upon the procedure and site of procedure.  The surgical site was marked.  The risks, benefits, and alternatives of the surgery were reviewed and she wished to proceed.  Surgical consent had been signed.  She was given IV Ancef as preoperative antibiotic prophylaxis.  She was transferred to the operating room and placed on the operating room table in supine position with the Right upper extremity on an armboard.  Bier block and sedation was induced by Anesthesiology.  Right upper extremity was prepped and draped in normal sterile orthopaedic fashion.  A surgical pause was performed between the surgeons, anesthesia, and operating room staff, and all  were in agreement as to the patient, procedure, and site of procedure.  Tourniquet at the proximal aspect of the forearm  had been inflated for the Bier block.  Incision was made over the transverse carpal ligament and carried into the subcutaneous tissues by spreading technique.  Bipolar electrocautery was used to obtain hemostasis.  The palmar fascia was sharply incised.  The transverse carpal ligament was identified and sharply incised.  It was incised distally first.  Care was taken to ensure complete decompression distally.  It was then incised proximally.  Scissors were used to split the distal aspect of the volar antebrachial fascia.  A finger was placed into the wound to ensure complete decompression, which was the case.  The nerve was examined.  It was flattened and hyperemic.  The motor branch was identified and was intact.  The wound was copiously irrigated with sterile saline.  It was then closed with 4-0 nylon in a horizontal mattress fashion.  It was injected with 0.25% plain Marcaine to aid in postoperative analgesia.  It was dressed with sterile Xeroform, 4x4s, an ABD, and wrapped with Kerlix and an Ace bandage.  Tourniquet was deflated at 30 minutes.  Fingertips were pink with brisk capillary refill after deflation of the tourniquet.  Operative drapes were broken down.  The patient was awoken from anesthesia safely.  She was transferred back to stretcher and taken to the PACU in stable condition.  I will see her back in the office in 1 week for postoperative followup.  I will give her a prescription for percocet 5/325 1-2 tabs PO q6 hours prn pain, dispense #20.    Tennis Must, MD Electronically signed, 02/16/17

## 2017-02-16 NOTE — H&P (Signed)
Sara Cross is an 61 y.o. female.   Chief Complaint: right carpal tunnel syndrome HPI: 61 yo female with numbness and tingling in right hand.  This is bothersome to her.  Nocturnal symptoms.  Positive nerve conduction studies.  Allergies:  Allergies  Allergen Reactions  . Benicar [Olmesartan] Other (See Comments)  . Ziac [Bisoprolol-Hydrochlorothiazide] Other (See Comments)  . Lortab [Hydrocodone-Acetaminophen] Itching    Past Medical History:  Diagnosis Date  . Anxiety   . Arthritis   . DDD (degenerative disc disease)   . Depression   . Diabetes mellitus    cbg fasting 170-200  . Dizziness   . GERD (gastroesophageal reflux disease)   . Headache(784.0)   . Heart murmur    as a child  . History of bronchitis   . Hyperlipidemia    "under control"  . Hypertension   . Morbid obesity ([place])   . Numbness and tingling in left arm   . Numbness and tingling of both legs   . Pneumonia    hx of  . Sleep apnea    wear cpap nightly    Past Surgical History:  Procedure Laterality Date  . ABDOMINAL HYSTERECTOMY  2004   partial  . ANTERIOR CERVICAL DECOMP/DISCECTOMY FUSION  12/23/2011   Procedure: ANTERIOR CERVICAL DECOMPRESSION/DISCECTOMY FUSION 2 LEVELS;  Surgeon: Elaina Hoops, MD;  Location: [place] [facility];  Service: Neurosurgery;  Laterality: N/A;  Anterior Cervical Four-Five/Five-Six Decompression and Fusion  . ANTERIOR CERVICAL DECOMP/DISCECTOMY FUSION N/A 06/07/2013   Procedure: ANTERIOR CERVICAL DECOMPRESSION/DISCECTOMY FUSION 1 LEVEL/HARDWARE REMOVAL;  Surgeon: Elaina Hoops, MD;  Location: [place] [facility];  Service: Neurosurgery;  Laterality: N/A;  ANTERIOR CERVICAL DECOMPRESSION/DISCECTOMY FUSION 1 LEVEL/HARDWARE REMOVAL  . APPLICATION OF INTRAOPERATIVE CT SCAN N/A 07/22/2015   Procedure: APPLICATION OF INTRAOPERATIVE CT SCAN;  Surgeon: Kary Kos, MD;  Location: [place] [facility];  Service: Neurosurgery;  Laterality: N/A;  . BACK SURGERY     01/2011  . BREAST REDUCTION SURGERY Bilateral  04/2015  . CARPAL TUNNEL RELEASE  12/23/2011   Procedure: CARPAL TUNNEL RELEASE;  Surgeon: Elaina Hoops, MD;  Location: [place] [facility];  Service: Neurosurgery;  Laterality: Left;  Left Carpal Tunnel Release  . CESEAREAN     X2  . COLONOSCOPY    . HAND SURGERY     right   2011    FOR INJURY   . LUMBAR LAMINECTOMY/DECOMPRESSION MICRODISCECTOMY N/A 08/29/2014   Procedure: Thoracic seven-eight  microdiskectomy;  Surgeon: Elaina Hoops, MD;  Location: [place] [facility];  Service: Neurosurgery;  Laterality: N/A;  . toe nail removal     right great toe  . TOENAIL EXCISION     BIG TOE RIGHT FOOT    Family History: Family History  Problem Relation Age of Onset  . Cancer Father        ORAL  . Arthritis Mother   . Diabetes Sister   . Hyperlipidemia Sister   . Diabetes Maternal Grandmother   . Hypertension Maternal Grandmother   . Hyperlipidemia Maternal Grandmother   . Heart disease Maternal Grandmother   . Cancer Maternal Grandmother        stomach  . Hypertension Other   . Hyperlipidemia Other   . Sleep apnea Other   . Obesity Other   . Cancer Paternal Grandfather        liver  . Anesthesia problems Neg Hx   . Hypotension Neg Hx   . Malignant hyperthermia Neg Hx   . Pseudochol deficiency  Neg Hx   . Seizures Neg Hx     Social History:   reports that she quit smoking about 2 years ago. Her smoking use included Cigarettes. She has a 7.50 pack-year smoking history. She has never used smokeless tobacco. She reports that she drinks alcohol. She reports that she does not use drugs.  Medications: No prescriptions prior to admission.    No results found for this or any previous visit (from the past 48 hour(s)).  No results found.   A comprehensive review of systems was negative except for: Musculoskeletal: positive for neck pain Neurological: positive for paresthesia and tremors  Height 5\' 2"  (1.575 m), weight 93 kg (205 lb).  General appearance: alert, cooperative and appears stated  age Head: Normocephalic, without obvious abnormality, atraumatic Neck: supple, symmetrical, trachea midline Resp: clear to auscultation bilaterally Cardio: regular rate and rhythm GI: non-tender Extremities: Intact sensation and capillary refill all digits on left.  Decreased sensation in right hand.  +epl/fpl/io.  No wounds.  Pulses: 2+ and symmetric Skin: Skin color, texture, turgor normal. No rashes or lesions Neurologic: Grossly normal Incision/Wound:none  Assessment/Plan Right carpal tunnel syndrome.  Non operative and operative treatment options were discussed with the patient and patient wishes to proceed with operative treatment. Risks, benefits, and alternatives of surgery were discussed and the patient agrees with the plan of care.   Jese Comella R 02/16/2017, 8:31 AM

## 2017-02-16 NOTE — Discharge Instructions (Addendum)

## 2017-02-16 NOTE — Transfer of Care (Signed)
Immediate Anesthesia Transfer of Care Note  Patient: Sara Cross  Procedure(s) Performed: Procedure(s): RIGHT CARPAL TUNNEL RELEASE (Right)  Patient Location: PACU  Anesthesia Type:MAC and Bier block  Level of Consciousness: awake, alert  and oriented  Airway & Oxygen Therapy: Patient Spontanous Breathing and Patient connected to face mask oxygen  Post-op Assessment: Report given to RN and Post -op Vital signs reviewed and stable  Post vital signs: Reviewed and stable  Last Vitals:  Vitals:   02/16/17 0937  BP: 124/68  Pulse: 81  Resp: 18  Temp: 36.9 C    Last Pain:  Vitals:   02/16/17 0937  TempSrc: Oral  PainSc: 0-No pain         Complications: No apparent anesthesia complications

## 2017-02-17 ENCOUNTER — Encounter (HOSPITAL_BASED_OUTPATIENT_CLINIC_OR_DEPARTMENT_OTHER): Payer: Self-pay | Admitting: Orthopedic Surgery

## 2018-10-04 ENCOUNTER — Other Ambulatory Visit: Payer: Self-pay | Admitting: Family Medicine

## 2018-10-04 DIAGNOSIS — R112 Nausea with vomiting, unspecified: Secondary | ICD-10-CM

## 2018-10-04 DIAGNOSIS — R1013 Epigastric pain: Secondary | ICD-10-CM

## 2018-10-04 DIAGNOSIS — R911 Solitary pulmonary nodule: Secondary | ICD-10-CM

## 2018-10-15 ENCOUNTER — Ambulatory Visit
Admission: RE | Admit: 2018-10-15 | Discharge: 2018-10-15 | Disposition: A | Discharge status: Home / Self Care | Payer: Medicare Other | Source: Ambulatory Visit | Attending: Family Medicine | Admitting: Family Medicine

## 2018-10-15 DIAGNOSIS — R1013 Epigastric pain: Secondary | ICD-10-CM

## 2018-10-15 DIAGNOSIS — R911 Solitary pulmonary nodule: Secondary | ICD-10-CM

## 2018-10-15 DIAGNOSIS — R112 Nausea with vomiting, unspecified: Secondary | ICD-10-CM

## 2018-10-15 MED ORDER — IOPAMIDOL (ISOVUE-300) INJECTION 61%
100.0000 mL | Freq: Once | INTRAVENOUS | Status: AC | PRN
  Administered 2018-10-15: 100 mL via INTRAVENOUS

## 2019-05-23 ENCOUNTER — Other Ambulatory Visit: Payer: Self-pay | Admitting: Neurosurgery

## 2019-05-23 DIAGNOSIS — M5414 Radiculopathy, thoracic region: Secondary | ICD-10-CM

## 2019-06-03 ENCOUNTER — Ambulatory Visit
Admission: RE | Admit: 2019-06-03 | Discharge: 2019-06-03 | Disposition: A | Discharge status: Home / Self Care | Payer: Medicare Other | Source: Ambulatory Visit | Attending: Neurosurgery | Admitting: Neurosurgery

## 2019-06-03 ENCOUNTER — Other Ambulatory Visit: Payer: Self-pay

## 2019-06-03 DIAGNOSIS — M5414 Radiculopathy, thoracic region: Secondary | ICD-10-CM

## 2021-07-01 ENCOUNTER — Other Ambulatory Visit: Payer: Self-pay | Admitting: Family Medicine

## 2021-07-01 DIAGNOSIS — Z1231 Encounter for screening mammogram for malignant neoplasm of breast: Secondary | ICD-10-CM

## 2021-08-04 ENCOUNTER — Ambulatory Visit
Admission: RE | Admit: 2021-08-04 | Discharge: 2021-08-04 | Disposition: A | Discharge status: Home / Self Care | Payer: Medicare Other | Source: Ambulatory Visit | Attending: Family Medicine | Admitting: Family Medicine

## 2021-08-04 DIAGNOSIS — Z1231 Encounter for screening mammogram for malignant neoplasm of breast: Secondary | ICD-10-CM

## 2022-03-10 ENCOUNTER — Other Ambulatory Visit: Payer: Self-pay | Admitting: Neurosurgery

## 2022-03-18 NOTE — Progress Notes (Signed)
Surgical Instructions    Your procedure is scheduled on Friday, 04/08/22.  Report to [facility] Main Entrance "A" at 5:30 A.M., then check in with the Admitting office.  Call this number if you have problems the morning of surgery:  [phone]   If you have any questions prior to your surgery date call [phone]: Open Monday-Friday 8am-4pm    Remember:  Do not eat or drink after midnight the night before your surgery     Take these medicines the morning of surgery with A SIP OF WATER:  pantoprazole (PROTONIX)   IF NEEDED: traMADol (ULTRAM)   As of today, STOP taking any Aspirin (unless otherwise instructed by your surgeon) Aleve, Naproxen, Ibuprofen, Motrin, Advil, Goody's, BC's, all herbal medications, fish oil, meloxicam (MOBIC) and all vitamins.  WHAT DO I DO ABOUT MY DIABETES MEDICATION?   Do not take oral diabetes medicines (pills) the morning of surgery.   THE MORNING OF SURGERY, do not take metFORMIN (GLUCOPHAGE-XR).  The day of surgery, do not take other diabetes injectables, including Byetta (exenatide), Bydureon (exenatide ER), Victoza (liraglutide), or Trulicity (dulaglutide).  If your CBG is greater than 220 mg/dL, you may take  of your sliding scale (correction) dose of insulin.   HOW TO MANAGE YOUR DIABETES BEFORE AND AFTER SURGERY  Why is it important to control my blood sugar before and after surgery? Improving blood sugar levels before and after surgery helps healing and can limit problems. A way of improving blood sugar control is eating a healthy diet by:  Eating less sugar and carbohydrates  Increasing activity/exercise  Talking with your doctor about reaching your blood sugar goals High blood sugars (greater than 180 mg/dL) can raise your risk of infections and slow your recovery, so you will need to focus on controlling your diabetes during the weeks before surgery. Make sure that the doctor who takes care of your diabetes knows about your  planned surgery including the date and location.  How do I manage my blood sugar before surgery? Check your blood sugar at least 4 times a day, starting 2 days before surgery, to make sure that the level is not too high or low.  Check your blood sugar the morning of your surgery when you wake up and every 2 hours until you get to the Short Stay unit.  If your blood sugar is less than 70 mg/dL, you will need to treat for low blood sugar: Do not take insulin. Treat a low blood sugar (less than 70 mg/dL) with  cup of clear juice (cranberry or apple), 4 glucose tablets, OR glucose gel. Recheck blood sugar in 15 minutes after treatment (to make sure it is greater than 70 mg/dL). If your blood sugar is not greater than 70 mg/dL on recheck, call [phone] for further instructions. Report your blood sugar to the short stay nurse when you get to Short Stay.  If you are admitted to the hospital after surgery: Your blood sugar will be checked by the staff and you will probably be given insulin after surgery (instead of oral diabetes medicines) to make sure you have good blood sugar levels. The goal for blood sugar control after surgery is 80-180 mg/dL.           Do not wear jewelry or makeup. Do not wear lotions, powders, perfumes/cologne or deodorant. Do not shave 48 hours prior to surgery.   Do not bring valuables to the hospital. Do not wear nail polish, gel polish, artificial nails, or  any other type of covering on natural nails (fingers and toes) If you have artificial nails or gel coating that need to be removed by a nail salon, please have this removed prior to surgery. Artificial nails or gel coating may interfere with anesthesia's ability to adequately monitor your vital signs.  [place] is not responsible for any belongings or valuables.    Do NOT Smoke (Tobacco/Vaping)  24 hours prior to your procedure  If you use a CPAP at night, you may bring your mask for your overnight stay.    Contacts, glasses, hearing aids, dentures or partials may not be worn into surgery, please bring cases for these belongings   For patients admitted to the hospital, discharge time will be determined by your treatment team.   Patients discharged the day of surgery will not be allowed to drive home, and someone needs to stay with them for 24 hours.   SURGICAL WAITING ROOM VISITATION Patients having surgery or a procedure may have no more than 2 support people in the waiting area - these visitors may rotate.   Children under the age of 30 must have an adult with them who is not the patient. If the patient needs to stay at the hospital during part of their recovery, the visitor guidelines for inpatient rooms apply. Pre-op nurse will coordinate an appropriate time for 1 support person to accompany patient in pre-op.  This support person may not rotate.   Please refer to the [facility] website for the visitor guidelines for Inpatients (after your surgery is over and you are in a regular room).    Special instructions:    Oral Hygiene is also important to reduce your risk of infection.  Remember - BRUSH YOUR TEETH THE MORNING OF SURGERY WITH YOUR REGULAR TOOTHPASTE   [place]- Preparing For Surgery  Before surgery, you can play an important role. Because skin is not sterile, your skin needs to be as free of germs as possible. You can reduce the number of germs on your skin by washing with CHG (chlorahexidine gluconate) Soap before surgery.  CHG is an antiseptic cleaner which kills germs and bonds with the skin to continue killing germs even after washing.     Please do not use if you have an allergy to CHG or antibacterial soaps. If your skin becomes reddened/irritated stop using the CHG.  Do not shave (including legs and underarms) for at least 48 hours prior to first CHG shower. It is OK to shave your face.  Please follow these instructions carefully.     Shower the NIGHT BEFORE  SURGERY and the MORNING OF SURGERY with CHG Soap.   If you chose to wash your hair, wash your hair first as usual with your normal shampoo. After you shampoo, rinse your hair and body thoroughly to remove the shampoo.  Then [employer] and genitals (private parts) with your normal soap and rinse thoroughly to remove soap.  After that Use CHG Soap as you would any other liquid soap. You can apply CHG directly to the skin and wash gently with a scrungie or a clean washcloth.   Apply the CHG Soap to your body ONLY FROM THE NECK DOWN.  Do not use on open wounds or open sores. Avoid contact with your eyes, ears, mouth and genitals (private parts). Wash Face and genitals (private parts)  with your normal soap.   Wash thoroughly, paying special attention to the area where your surgery will be performed.  Thoroughly rinse your body with warm water from the neck down.  DO NOT shower/wash with your normal soap after using and rinsing off the CHG Soap.  Pat yourself dry with a CLEAN TOWEL.  Wear CLEAN PAJAMAS to bed the night before surgery  Place CLEAN SHEETS on your bed the night before your surgery  DO NOT SLEEP WITH PETS.   Day of Surgery: Take a shower with CHG soap. Wear Clean/Comfortable clothing the morning of surgery Do not apply any deodorants/lotions.   Remember to brush your teeth WITH YOUR REGULAR TOOTHPASTE.    If you received a COVID test during your pre-op visit, it is requested that you wear a mask when out in public, stay away from anyone that may not be feeling well, and notify your surgeon if you develop symptoms. If you have been in contact with anyone that has tested positive in the last 10 days, please notify your surgeon.    Please read over the following fact sheets that you were given.

## 2022-03-21 ENCOUNTER — Inpatient Hospital Stay (HOSPITAL_COMMUNITY)
Admission: RE | Admit: 2022-03-21 | Discharge: 2022-03-21 | Disposition: A | Discharge status: Home / Self Care | Payer: Medicare Other | Source: Ambulatory Visit

## 2022-04-06 ENCOUNTER — Encounter (HOSPITAL_COMMUNITY): Payer: Self-pay

## 2022-04-06 ENCOUNTER — Other Ambulatory Visit: Payer: Self-pay

## 2022-04-06 ENCOUNTER — Encounter (HOSPITAL_COMMUNITY)
Admission: RE | Admit: 2022-04-06 | Discharge: 2022-04-06 | Disposition: A | Discharge status: Home / Self Care | Payer: Medicare Other | Source: Ambulatory Visit | Attending: Neurosurgery | Admitting: Neurosurgery

## 2022-04-06 VITALS — BP 177/83 | HR 67 | Temp 97.9°F | Resp 17 | Ht 63.0 in | Wt 177.6 lb

## 2022-04-06 DIAGNOSIS — Z01818 Encounter for other preprocedural examination: Secondary | ICD-10-CM | POA: Diagnosis present

## 2022-04-06 DIAGNOSIS — E119 Type 2 diabetes mellitus without complications: Secondary | ICD-10-CM | POA: Diagnosis not present

## 2022-04-06 HISTORY — DX: Personal history of urinary calculi: Z87.442

## 2022-04-06 LAB — CBC
HCT: 41.5 % (ref 36.0–46.0)
Hemoglobin: 13.5 g/dL (ref 12.0–15.0)
MCH: 30.4 pg (ref 26.0–34.0)
MCHC: 32.5 g/dL (ref 30.0–36.0)
MCV: 93.5 fL (ref 80.0–100.0)
Platelets: 230 10*3/uL (ref 150–400)
RBC: 4.44 MIL/uL (ref 3.87–5.11)
RDW: 13.8 % (ref 11.5–15.5)
WBC: 6.8 10*3/uL (ref 4.0–10.5)
nRBC: 0 % (ref 0.0–0.2)

## 2022-04-06 LAB — BASIC METABOLIC PANEL
Anion gap: 5 (ref 5–15)
BUN: 10 mg/dL (ref 8–23)
CO2: 28 mmol/L (ref 22–32)
Calcium: 9.7 mg/dL (ref 8.9–10.3)
Chloride: 103 mmol/L (ref 98–111)
Creatinine, Ser: 0.65 mg/dL (ref 0.44–1.00)
GFR, Estimated: >60 mL/min (ref >60)
Glucose, Bld: 124 mg/dL — ABNORMAL HIGH (ref 70–99)
Potassium: 4.4 mmol/L (ref 3.5–5.1)
Sodium: 136 mmol/L (ref 135–145)

## 2022-04-06 LAB — GLUCOSE, CAPILLARY: Glucose-Capillary: 134 mg/dL — ABNORMAL HIGH (ref 70–99)

## 2022-04-06 LAB — HEMOGLOBIN A1C
Hgb A1c MFr Bld: 6.7 % — ABNORMAL HIGH (ref 4.8–5.6)
Mean Plasma Glucose: 145.59 mg/dL

## 2022-04-06 LAB — SURGICAL PCR SCREEN
MRSA, PCR: NEGATIVE
Staphylococcus aureus: NEGATIVE

## 2022-04-06 NOTE — Progress Notes (Signed)
PCP - Dr. Claris Gower Cardiologist - denies  PPM/ICD - n/a  Chest x-ray - n/a EKG - 04/06/22 Stress Test - 2015 ECHO - 2015 Cardiac Cath - denies  Sleep Study - 2010. +OSA. Has not used CPAP since weight loss per patient  Fasting Blood Sugar - 134 Checks Blood Sugar once or twice a week. Usually in the lower 100's per patient.   Blood Thinner Instructions: n/a Aspirin Instructions: n/a  ERAS Protcol - No. NPO  COVID TEST- n/a   Anesthesia review: No  Patient denies shortness of breath, fever, cough and chest pain at PAT appointment   All instructions explained to the patient, with a verbal understanding of the material. Patient agrees to go over the instructions while at home for a better understanding. Patient also instructed to self quarantine after being tested for COVID-19. The opportunity to ask questions was provided.

## 2022-04-07 NOTE — Anesthesia Preprocedure Evaluation (Addendum)
Anesthesia Evaluation  Patient identified by MRN, date of birth, ID band Patient awake    Reviewed: Allergy & Precautions, NPO status , Patient's Chart, lab work & pertinent test results  History of Anesthesia Complications Negative for: history of anesthetic complications  Airway Mallampati: II  TM Distance: >3 FB Neck ROM: Full    Dental  (+) Missing,    Pulmonary sleep apnea and Continuous Positive Airway Pressure Ventilation , former smoker,    Pulmonary exam normal        Cardiovascular hypertension, Pt. on medications Normal cardiovascular exam  TTE 2015: EF 55-60%, mild LVH, grade 1 DD, valves ok      Neuro/Psych  Headaches, Anxiety Depression Thoracic spinal stenosis    GI/Hepatic Neg liver ROS, GERD  Medicated and Controlled,  Endo/Other  diabetes, Type 2, Oral Hypoglycemic Agents  Renal/GU negative Renal ROS  negative genitourinary   Musculoskeletal  (+) Arthritis ,   Abdominal   Peds  Hematology negative hematology ROS (+)   Anesthesia Other Findings Day of surgery medications reviewed with patient.  Reproductive/Obstetrics negative OB ROS                            Anesthesia Physical Anesthesia Plan  ASA: 2  Anesthesia Plan: General   Post-op Pain Management: Tylenol PO (pre-op)* and Ketamine IV*   Induction: Intravenous  PONV Risk Score and Plan: 3 and Treatment may vary due to age or medical condition, Ondansetron, Dexamethasone and Midazolam  Airway Management Planned: Oral ETT  Additional Equipment: None  Intra-op Plan:   Post-operative Plan: Extubation in OR  Informed Consent: I have reviewed the patients History and Physical, chart, labs and discussed the procedure including the risks, benefits and alternatives for the proposed anesthesia with the patient or authorized representative who has indicated his/her understanding and acceptance.     Dental  advisory given  Plan Discussed with: CRNA  Anesthesia Plan Comments:        Anesthesia Quick Evaluation

## 2022-04-08 ENCOUNTER — Ambulatory Visit (HOSPITAL_COMMUNITY)
Admission: RE | Admit: 2022-04-08 | Discharge: 2022-04-09 | Disposition: A | Discharge status: Home / Self Care | Payer: Medicare Other | Attending: Neurosurgery | Admitting: Neurosurgery

## 2022-04-08 ENCOUNTER — Ambulatory Visit (HOSPITAL_COMMUNITY): Payer: Medicare Other | Admitting: Anesthesiology

## 2022-04-08 ENCOUNTER — Ambulatory Visit (HOSPITAL_COMMUNITY): Payer: Medicare Other

## 2022-04-08 ENCOUNTER — Encounter (HOSPITAL_COMMUNITY)
Admission: RE | Disposition: A | Discharge status: Home / Self Care | Payer: Self-pay | Source: Home / Self Care | Attending: Neurosurgery

## 2022-04-08 ENCOUNTER — Encounter (HOSPITAL_COMMUNITY): Payer: Self-pay | Admitting: Neurosurgery

## 2022-04-08 ENCOUNTER — Ambulatory Visit (HOSPITAL_BASED_OUTPATIENT_CLINIC_OR_DEPARTMENT_OTHER): Payer: Medicare Other | Admitting: Anesthesiology

## 2022-04-08 ENCOUNTER — Other Ambulatory Visit: Payer: Self-pay

## 2022-04-08 DIAGNOSIS — M4724 Other spondylosis with radiculopathy, thoracic region: Secondary | ICD-10-CM | POA: Insufficient documentation

## 2022-04-08 DIAGNOSIS — F418 Other specified anxiety disorders: Secondary | ICD-10-CM | POA: Insufficient documentation

## 2022-04-08 DIAGNOSIS — G4730 Sleep apnea, unspecified: Secondary | ICD-10-CM | POA: Diagnosis not present

## 2022-04-08 DIAGNOSIS — I10 Essential (primary) hypertension: Secondary | ICD-10-CM | POA: Diagnosis not present

## 2022-04-08 DIAGNOSIS — M4804 Spinal stenosis, thoracic region: Secondary | ICD-10-CM | POA: Insufficient documentation

## 2022-04-08 DIAGNOSIS — G9520 Unspecified cord compression: Secondary | ICD-10-CM | POA: Diagnosis not present

## 2022-04-08 DIAGNOSIS — E119 Type 2 diabetes mellitus without complications: Secondary | ICD-10-CM | POA: Diagnosis not present

## 2022-04-08 DIAGNOSIS — K219 Gastro-esophageal reflux disease without esophagitis: Secondary | ICD-10-CM | POA: Insufficient documentation

## 2022-04-08 DIAGNOSIS — Z7984 Long term (current) use of oral hypoglycemic drugs: Secondary | ICD-10-CM | POA: Diagnosis not present

## 2022-04-08 DIAGNOSIS — Z87891 Personal history of nicotine dependence: Secondary | ICD-10-CM | POA: Diagnosis not present

## 2022-04-08 DIAGNOSIS — M5104 Intervertebral disc disorders with myelopathy, thoracic region: Secondary | ICD-10-CM | POA: Diagnosis present

## 2022-04-08 DIAGNOSIS — M5114 Intervertebral disc disorders with radiculopathy, thoracic region: Secondary | ICD-10-CM | POA: Diagnosis not present

## 2022-04-08 HISTORY — PX: THORACIC DISCECTOMY: SHX6113

## 2022-04-08 LAB — GLUCOSE, CAPILLARY
Glucose-Capillary: 115 mg/dL — ABNORMAL HIGH (ref 70–99)
Glucose-Capillary: 147 mg/dL — ABNORMAL HIGH (ref 70–99)
Glucose-Capillary: 148 mg/dL — ABNORMAL HIGH (ref 70–99)
Glucose-Capillary: 177 mg/dL — ABNORMAL HIGH (ref 70–99)
Glucose-Capillary: 162 mg/dL — ABNORMAL HIGH (ref 70–99)
Glucose-Capillary: 149 mg/dL — ABNORMAL HIGH (ref 70–99)

## 2022-04-08 SURGERY — THORACIC DISCECTOMY
Anesthesia: General | Site: Back | Laterality: Left

## 2022-04-08 MED ORDER — CHLORHEXIDINE GLUCONATE CLOTH 2 % EX PADS: 6.0000 | MEDICATED_PAD | Freq: Once | CUTANEOUS | Status: DC

## 2022-04-08 MED ORDER — SUGAMMADEX SODIUM 200 MG/2ML IV SOLN
INTRAVENOUS | Status: DC | PRN
  Administered 2022-04-08: 200 mg via INTRAVENOUS

## 2022-04-08 MED ORDER — SODIUM CHLORIDE 0.9 % IV SOLN
250.0000 mL | INTRAVENOUS | Status: DC
  Administered 2022-04-08: 250 mL via INTRAVENOUS

## 2022-04-08 MED ORDER — DEXAMETHASONE SODIUM PHOSPHATE 10 MG/ML IJ SOLN
INTRAMUSCULAR | Status: DC | PRN
  Administered 2022-04-08: 5 mg via INTRAVENOUS

## 2022-04-08 MED ORDER — ORAL CARE MOUTH RINSE: 15.0000 mL | Freq: Once | OROMUCOSAL | Status: AC

## 2022-04-08 MED ORDER — HYDROMORPHONE HCL 1 MG/ML IJ SOLN
0.2500 mg | INTRAMUSCULAR | Status: DC | PRN
  Administered 2022-04-08 (×2): 0.5 mg via INTRAVENOUS

## 2022-04-08 MED ORDER — CEFAZOLIN SODIUM-DEXTROSE 2-4 GM/100ML-% IV SOLN
2.0000 g | Freq: Three times a day (TID) | INTRAVENOUS | Status: AC
  Administered 2022-04-08 – 2022-04-09 (×2): 2 g via INTRAVENOUS
  Filled 2022-04-08 (×2): qty 100

## 2022-04-08 MED ORDER — ACETAMINOPHEN 325 MG PO TABS: 650.0000 mg | ORAL_TABLET | ORAL | Status: DC | PRN

## 2022-04-08 MED ORDER — ROSUVASTATIN CALCIUM 5 MG PO TABS
5.0000 mg | ORAL_TABLET | Freq: Every evening | ORAL | Status: DC
  Administered 2022-04-08: 5 mg via ORAL
  Filled 2022-04-08: qty 1

## 2022-04-08 MED ORDER — LIDOCAINE 2% (20 MG/ML) 5 ML SYRINGE
INTRAMUSCULAR | Status: DC | PRN
  Administered 2022-04-08: 100 mg via INTRAVENOUS

## 2022-04-08 MED ORDER — MAGNESIUM OXIDE -MG SUPPLEMENT 400 (240 MG) MG PO TABS
400.0000 mg | ORAL_TABLET | Freq: Every day | ORAL | Status: DC
  Administered 2022-04-08 – 2022-04-09 (×2): 400 mg via ORAL
  Filled 2022-04-08 (×2): qty 1

## 2022-04-08 MED ORDER — KETAMINE HCL 50 MG/5ML IJ SOSY
PREFILLED_SYRINGE | INTRAMUSCULAR | Status: AC
Start: 2022-04-08 — End: ?
  Filled 2022-04-08: qty 5

## 2022-04-08 MED ORDER — PHENOL 1.4 % MT LIQD: 1.0000 | OROMUCOSAL | Status: DC | PRN

## 2022-04-08 MED ORDER — PANTOPRAZOLE SODIUM 40 MG IV SOLR: 40.0000 mg | Freq: Every day | INTRAVENOUS | Status: DC

## 2022-04-08 MED ORDER — ACETAMINOPHEN 650 MG RE SUPP: 650.0000 mg | RECTAL | Status: DC | PRN

## 2022-04-08 MED ORDER — LACTATED RINGERS IV SOLN: INTRAVENOUS | Status: DC

## 2022-04-08 MED ORDER — OXYCODONE HCL 5 MG PO TABS
10.0000 mg | ORAL_TABLET | ORAL | Status: DC | PRN
  Administered 2022-04-08 – 2022-04-09 (×7): 10 mg via ORAL
  Filled 2022-04-08 (×7): qty 2

## 2022-04-08 MED ORDER — PROPOFOL 10 MG/ML IV BOLUS
INTRAVENOUS | Status: DC | PRN
  Administered 2022-04-08: 160 mg via INTRAVENOUS
  Administered 2022-04-08: 20 mg via INTRAVENOUS
  Administered 2022-04-08: 30 mg via INTRAVENOUS

## 2022-04-08 MED ORDER — ROCURONIUM BROMIDE 10 MG/ML (PF) SYRINGE
PREFILLED_SYRINGE | INTRAVENOUS | Status: AC
  Filled 2022-04-08: qty 10

## 2022-04-08 MED ORDER — FUROSEMIDE 20 MG PO TABS: 20.0000 mg | ORAL_TABLET | Freq: Every day | ORAL | Status: DC | PRN

## 2022-04-08 MED ORDER — ONDANSETRON HCL 4 MG/2ML IJ SOLN: 4.0000 mg | Freq: Four times a day (QID) | INTRAMUSCULAR | Status: DC | PRN

## 2022-04-08 MED ORDER — TRAMADOL HCL 50 MG PO TABS: 50.0000 mg | ORAL_TABLET | Freq: Four times a day (QID) | ORAL | Status: DC | PRN

## 2022-04-08 MED ORDER — ROCURONIUM BROMIDE 10 MG/ML (PF) SYRINGE
PREFILLED_SYRINGE | INTRAVENOUS | Status: DC | PRN
  Administered 2022-04-08: 20 mg via INTRAVENOUS
  Administered 2022-04-08: 60 mg via INTRAVENOUS

## 2022-04-08 MED ORDER — CHLORHEXIDINE GLUCONATE 0.12 % MT SOLN
15.0000 mL | Freq: Once | OROMUCOSAL | Status: AC
  Administered 2022-04-08: 15 mL via OROMUCOSAL
  Filled 2022-04-08: qty 15

## 2022-04-08 MED ORDER — ALUM & MAG HYDROXIDE-SIMETH 200-200-20 MG/5ML PO SUSP: 30.0000 mL | Freq: Four times a day (QID) | ORAL | Status: DC | PRN

## 2022-04-08 MED ORDER — VITAMIN D 25 MCG (1000 UNIT) PO TABS
10000.0000 [IU] | ORAL_TABLET | Freq: Every day | ORAL | Status: DC
  Administered 2022-04-09: 10000 [IU] via ORAL
  Filled 2022-04-08: qty 10

## 2022-04-08 MED ORDER — PHENYLEPHRINE HCL-NACL 20-0.9 MG/250ML-% IV SOLN
INTRAVENOUS | Status: DC | PRN
  Administered 2022-04-08: 40 ug/min via INTRAVENOUS

## 2022-04-08 MED ORDER — BUPROPION HCL ER (XL) 300 MG PO TB24
300.0000 mg | ORAL_TABLET | Freq: Every day | ORAL | Status: DC
  Administered 2022-04-08: 300 mg via ORAL
  Filled 2022-04-08: qty 1

## 2022-04-08 MED ORDER — CYCLOBENZAPRINE HCL 10 MG PO TABS
10.0000 mg | ORAL_TABLET | Freq: Three times a day (TID) | ORAL | Status: DC | PRN
  Administered 2022-04-08 – 2022-04-09 (×3): 10 mg via ORAL
  Filled 2022-04-08 (×3): qty 1

## 2022-04-08 MED ORDER — PROPOFOL 10 MG/ML IV BOLUS
INTRAVENOUS | Status: AC
  Filled 2022-04-08: qty 20

## 2022-04-08 MED ORDER — MENTHOL 3 MG MT LOZG: 1.0000 | LOZENGE | OROMUCOSAL | Status: DC | PRN

## 2022-04-08 MED ORDER — ONDANSETRON HCL 4 MG/2ML IJ SOLN
INTRAMUSCULAR | Status: AC
Start: 2022-04-08 — End: ?
  Filled 2022-04-08: qty 2

## 2022-04-08 MED ORDER — LIDOCAINE 2% (20 MG/ML) 5 ML SYRINGE
INTRAMUSCULAR | Status: AC
Start: 2022-04-08 — End: ?
  Filled 2022-04-08: qty 5

## 2022-04-08 MED ORDER — 0.9 % SODIUM CHLORIDE (POUR BTL) OPTIME
TOPICAL | Status: DC | PRN
  Administered 2022-04-08: 1000 mL

## 2022-04-08 MED ORDER — LOSARTAN POTASSIUM 50 MG PO TABS
50.0000 mg | ORAL_TABLET | Freq: Every day | ORAL | Status: DC
  Administered 2022-04-08 – 2022-04-09 (×2): 50 mg via ORAL
  Filled 2022-04-08 (×2): qty 1

## 2022-04-08 MED ORDER — CEFAZOLIN SODIUM-DEXTROSE 2-4 GM/100ML-% IV SOLN
2.0000 g | INTRAVENOUS | Status: AC
  Administered 2022-04-08: 2 g via INTRAVENOUS
  Filled 2022-04-08: qty 100

## 2022-04-08 MED ORDER — MELOXICAM 7.5 MG PO TABS: 7.5000 mg | ORAL_TABLET | Freq: Every day | ORAL | Status: DC | PRN

## 2022-04-08 MED ORDER — THROMBIN 20000 UNITS EX SOLR
CUTANEOUS | Status: AC
Start: 2022-04-08 — End: ?
  Filled 2022-04-08: qty 20000

## 2022-04-08 MED ORDER — FENTANYL CITRATE (PF) 250 MCG/5ML IJ SOLN
INTRAMUSCULAR | Status: AC
  Filled 2022-04-08: qty 5

## 2022-04-08 MED ORDER — SODIUM CHLORIDE 0.9% FLUSH
3.0000 mL | Freq: Two times a day (BID) | INTRAVENOUS | Status: DC
  Administered 2022-04-08 – 2022-04-09 (×2): 3 mL via INTRAVENOUS

## 2022-04-08 MED ORDER — PHENYLEPHRINE 80 MCG/ML (10ML) SYRINGE FOR IV PUSH (FOR BLOOD PRESSURE SUPPORT)
PREFILLED_SYRINGE | INTRAVENOUS | Status: AC
Start: 2022-04-08 — End: ?
  Filled 2022-04-08: qty 10

## 2022-04-08 MED ORDER — ONDANSETRON HCL 4 MG PO TABS
4.0000 mg | ORAL_TABLET | Freq: Four times a day (QID) | ORAL | Status: DC | PRN
Start: 2022-04-08 — End: 2022-04-09

## 2022-04-08 MED ORDER — GABAPENTIN 300 MG PO CAPS
300.0000 mg | ORAL_CAPSULE | Freq: Every day | ORAL | Status: DC
  Administered 2022-04-08: 300 mg via ORAL
  Filled 2022-04-08: qty 1

## 2022-04-08 MED ORDER — BUPIVACAINE HCL (PF) 0.25 % IJ SOLN
INTRAMUSCULAR | Status: AC
  Filled 2022-04-08: qty 30

## 2022-04-08 MED ORDER — HYDROMORPHONE HCL 1 MG/ML IJ SOLN
INTRAMUSCULAR | Status: AC
  Filled 2022-04-08: qty 1

## 2022-04-08 MED ORDER — LIDOCAINE-EPINEPHRINE 1 %-1:100000 IJ SOLN
INTRAMUSCULAR | Status: AC
  Filled 2022-04-08: qty 1

## 2022-04-08 MED ORDER — FENTANYL CITRATE (PF) 250 MCG/5ML IJ SOLN
INTRAMUSCULAR | Status: DC | PRN
  Administered 2022-04-08: 50 ug via INTRAVENOUS
  Administered 2022-04-08: 100 ug via INTRAVENOUS
  Administered 2022-04-08: 50 ug via INTRAVENOUS

## 2022-04-08 MED ORDER — ONDANSETRON HCL 4 MG/2ML IJ SOLN
INTRAMUSCULAR | Status: DC | PRN
  Administered 2022-04-08: 4 mg via INTRAVENOUS

## 2022-04-08 MED ORDER — AMISULPRIDE (ANTIEMETIC) 5 MG/2ML IV SOLN: 10.0000 mg | Freq: Once | INTRAVENOUS | Status: DC | PRN

## 2022-04-08 MED ORDER — PANTOPRAZOLE SODIUM 40 MG PO TBEC
40.0000 mg | DELAYED_RELEASE_TABLET | Freq: Every day | ORAL | Status: DC
  Administered 2022-04-08: 40 mg via ORAL
  Filled 2022-04-08: qty 1

## 2022-04-08 MED ORDER — SODIUM CHLORIDE 0.9% FLUSH: 3.0000 mL | INTRAVENOUS | Status: DC | PRN

## 2022-04-08 MED ORDER — MIDAZOLAM HCL 2 MG/2ML IJ SOLN
INTRAMUSCULAR | Status: DC | PRN
  Administered 2022-04-08 (×2): 1 mg via INTRAVENOUS

## 2022-04-08 MED ORDER — METFORMIN HCL ER 500 MG PO TB24
2000.0000 mg | ORAL_TABLET | Freq: Every day | ORAL | Status: DC
  Administered 2022-04-08: 2000 mg via ORAL
  Filled 2022-04-08: qty 4

## 2022-04-08 MED ORDER — INSULIN ASPART 100 UNIT/ML IJ SOLN: 0.0000 [IU] | INTRAMUSCULAR | Status: DC | PRN

## 2022-04-08 MED ORDER — ACETAMINOPHEN 500 MG PO TABS
1000.0000 mg | ORAL_TABLET | Freq: Once | ORAL | Status: AC
  Administered 2022-04-08: 1000 mg via ORAL
  Filled 2022-04-08: qty 2

## 2022-04-08 MED ORDER — INSULIN ASPART 100 UNIT/ML IJ SOLN
0.0000 [IU] | Freq: Three times a day (TID) | INTRAMUSCULAR | Status: DC
  Administered 2022-04-08: 3 [IU] via SUBCUTANEOUS
  Administered 2022-04-09: 2 [IU] via SUBCUTANEOUS

## 2022-04-08 MED ORDER — DEXAMETHASONE SODIUM PHOSPHATE 10 MG/ML IJ SOLN
INTRAMUSCULAR | Status: AC
  Filled 2022-04-08: qty 1

## 2022-04-08 MED ORDER — PHENYLEPHRINE 80 MCG/ML (10ML) SYRINGE FOR IV PUSH (FOR BLOOD PRESSURE SUPPORT)
PREFILLED_SYRINGE | INTRAVENOUS | Status: DC | PRN
  Administered 2022-04-08: 80 ug via INTRAVENOUS
  Administered 2022-04-08: 160 ug via INTRAVENOUS

## 2022-04-08 MED ORDER — PANTOPRAZOLE SODIUM 40 MG PO TBEC: 40.0000 mg | DELAYED_RELEASE_TABLET | Freq: Every day | ORAL | Status: DC

## 2022-04-08 MED ORDER — LIDOCAINE-EPINEPHRINE 1 %-1:100000 IJ SOLN
INTRAMUSCULAR | Status: DC | PRN
  Administered 2022-04-08: 10 mL

## 2022-04-08 MED ORDER — KETAMINE HCL 10 MG/ML IJ SOLN
INTRAMUSCULAR | Status: DC | PRN
  Administered 2022-04-08: 30 mg via INTRAVENOUS

## 2022-04-08 MED ORDER — MIDAZOLAM HCL 2 MG/2ML IJ SOLN
INTRAMUSCULAR | Status: AC
Start: 2022-04-08 — End: ?
  Filled 2022-04-08: qty 2

## 2022-04-08 MED ORDER — HYDROMORPHONE HCL 1 MG/ML IJ SOLN: 0.5000 mg | INTRAMUSCULAR | Status: DC | PRN

## 2022-04-08 MED ORDER — THROMBIN 20000 UNITS EX SOLR
CUTANEOUS | Status: DC | PRN
  Administered 2022-04-08: 20 mL via TOPICAL

## 2022-04-08 MED ORDER — INSULIN ASPART 100 UNIT/ML IJ SOLN: 0.0000 [IU] | Freq: Every day | INTRAMUSCULAR | Status: DC

## 2022-04-08 MED ORDER — BUPIVACAINE HCL (PF) 0.25 % IJ SOLN
INTRAMUSCULAR | Status: DC | PRN
  Administered 2022-04-08: 10 mL

## 2022-04-08 SURGICAL SUPPLY — 63 items
ADH SKN CLS APL DERMABOND .7 (GAUZE/BANDAGES/DRESSINGS) ×1
APL SKNCLS STERI-STRIP NONHPOA (GAUZE/BANDAGES/DRESSINGS) ×1
BAG COUNTER SPONGE SURGICOUNT (BAG) ×2 IMPLANT
BAG SPNG CNTER NS LX DISP (BAG) ×1
BAND INSRT 18 STRL LF DISP RB (MISCELLANEOUS) ×2
BAND RUBBER #18 3X1/16 STRL (MISCELLANEOUS) ×4 IMPLANT
BENZOIN TINCTURE AMPULE (MISCELLANEOUS) IMPLANT
BENZOIN TINCTURE PRP APPL 2/3 (GAUZE/BANDAGES/DRESSINGS) ×2 IMPLANT
BIT DRILL NEURO 2X3.1 SFT TUCH (MISCELLANEOUS) IMPLANT
BLADE CLIPPER SURG (BLADE) IMPLANT
BLADE SURG 11 STRL SS (BLADE) ×2 IMPLANT
BUR MATCHSTICK NEURO 3.0 LAGG (BURR) ×2 IMPLANT
BUR PRECISION FLUTE 6.0 (BURR) ×2 IMPLANT
CANISTER SUCT 3000ML PPV (MISCELLANEOUS) ×2 IMPLANT
CARTRIDGE OIL MAESTRO DRILL (MISCELLANEOUS) ×2 IMPLANT
CLSR STERI-STRIP ANTIMIC 1/2X4 (GAUZE/BANDAGES/DRESSINGS) IMPLANT
DERMABOND IMPLANT
DERMABOND ADVANCED (GAUZE/BANDAGES/DRESSINGS) ×1
DERMABOND ADVANCED .7 DNX12 (GAUZE/BANDAGES/DRESSINGS) ×2 IMPLANT
DIFFUSER DRILL AIR PNEUMATIC (MISCELLANEOUS) ×2 IMPLANT
DRAPE C-ARM 42X72 X-RAY (DRAPES) IMPLANT
DRAPE HALF SHEET 40X57 (DRAPES) IMPLANT
DRAPE LAPAROTOMY 100X72X124 (DRAPES) ×2 IMPLANT
DRAPE MICROSCOPE LEICA (MISCELLANEOUS) ×2 IMPLANT
DRAPE SURG 17X23 STRL (DRAPES) ×2 IMPLANT
DRILL NEURO 2X3.1 SOFT TOUCH (MISCELLANEOUS) ×1
DRSG OPSITE POSTOP 4X6 (GAUZE/BANDAGES/DRESSINGS) IMPLANT
DURAPREP 26ML APPLICATOR (WOUND CARE) ×2 IMPLANT
ELECT REM PT RETURN 9FT ADLT (ELECTROSURGICAL) ×2
ELECTRODE REM PT RTRN 9FT ADLT (ELECTROSURGICAL) ×2 IMPLANT
GAUZE 4X4 16PLY ~~LOC~~+RFID DBL (SPONGE) IMPLANT
GAUZE SPONGE 4X4 12PLY STRL (GAUZE/BANDAGES/DRESSINGS) ×2 IMPLANT
GLOVE BIO SURGEON STRL SZ7 (GLOVE) IMPLANT
GLOVE BIO SURGEON STRL SZ8 (GLOVE) ×2 IMPLANT
GLOVE BIOGEL PI IND STRL 7.0 (GLOVE) IMPLANT
GLOVE BIOGEL PI INDICATOR 7.0 (GLOVE) ×2
GLOVE ECLIPSE 7.5 STRL STRAW (GLOVE) IMPLANT
GLOVE EXAM NITRILE XL STR (GLOVE) IMPLANT
GLOVE INDICATOR 8.5 STRL (GLOVE) ×4 IMPLANT
GOWN STRL REUS W/ TWL LRG LVL3 (GOWN DISPOSABLE) ×2 IMPLANT
GOWN STRL REUS W/ TWL XL LVL3 (GOWN DISPOSABLE) ×4 IMPLANT
GOWN STRL REUS W/TWL 2XL LVL3 (GOWN DISPOSABLE) IMPLANT
GOWN STRL REUS W/TWL LRG LVL3 (GOWN DISPOSABLE) ×2
GOWN STRL REUS W/TWL XL LVL3 (GOWN DISPOSABLE) ×3
KIT BASIN OR (CUSTOM PROCEDURE TRAY) ×2 IMPLANT
KIT TURNOVER KIT B (KITS) ×2 IMPLANT
NDL SPNL 22GX3.5 QUINCKE BK (NEEDLE) ×2 IMPLANT
NEEDLE HYPO 22GX1.5 SAFETY (NEEDLE) ×2 IMPLANT
NEEDLE SPNL 22GX3.5 QUINCKE BK (NEEDLE) ×1 IMPLANT
NS IRRIG 1000ML POUR BTL (IV SOLUTION) ×2 IMPLANT
OIL CARTRIDGE MAESTRO DRILL (MISCELLANEOUS) ×1
PACK LAMINECTOMY NEURO (CUSTOM PROCEDURE TRAY) ×2 IMPLANT
SPIKE FLUID TRANSFER (MISCELLANEOUS) ×2 IMPLANT
SPONGE SURGIFOAM ABS GEL 100 (HEMOSTASIS) IMPLANT
SPONGE SURGIFOAM ABS GEL SZ50 (HEMOSTASIS) ×2 IMPLANT
STRIP CLOSURE SKIN 1/2X4 (GAUZE/BANDAGES/DRESSINGS) ×2 IMPLANT
SUT VIC AB 0 CT1 18XCR BRD8 (SUTURE) ×2 IMPLANT
SUT VIC AB 0 CT1 8-18 (SUTURE) ×2
SUT VIC AB 2-0 CT1 18 (SUTURE) ×2 IMPLANT
SUT VICRYL 4-0 PS2 18IN ABS (SUTURE) ×2 IMPLANT
TOWEL GREEN STERILE (TOWEL DISPOSABLE) ×2 IMPLANT
TOWEL GREEN STERILE FF (TOWEL DISPOSABLE) ×2 IMPLANT
WATER STERILE IRR 1000ML POUR (IV SOLUTION) ×2 IMPLANT

## 2022-04-08 NOTE — Op Note (Signed)
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  2.  Thoracic spinal stenosis with cord compression T10-11.  Postoperative diagnosis: Same  Procedure: #1 transpedicular discectomy on the left at T4-5 with microscopic discectomy and microscopic foraminotomies.  2.  Decompressive thoracic laminectomy with microscopic foraminotomies of the T10 XI nerve roots at T10-11 partial medial facetectomies  Surgeon: Dominica Severin Shaquanda Graves  Assistant: Nash Shearer  Anesthesia: General  EBL: Minimal  HPI: 66 year old female with progressive worsening back left chest wall pain consistent with T5 nerve root pattern work-up revealed severe thoracic spondylosis at T4-5 with disc bulge and compression of the T5 nerve root as well as cord compression from severe stenosis at T10-11.  Due to patient progression of clinical syndrome imaging findings of a conservative treatment I recommend transpedicular discectomy at T4-5 and decompressive thoracic laminectomy at T10-11.  I extensively went over the risks and benefits of that operation with her as well as perioperative course expectations of outcome and alternatives of surgery and she understood and agreed to proceed forward.  Operative procedure: Patient was brought into the OR was induced under general anesthesia positioned prone on flat rolls her back was prepped and draped in routine sterile fashion preoperative C arm was utilized to localize the appropriate level so we utilized the L1 pedicle screws as a marker identified the T11 pedicle and then utilizing that as a marker identify the T5 pedicle everything was drawn out and the back was prepped and draped in routine sterile fashion both incisions were drawn out the midline and after infiltration of 10 cc lidocaine with epi midline incisions were made at both levels.  Subperiosteal dissection carried lamina at T4-5 as well as T10-11.  Intraoperative x-ray confirmed defecation appropriate  levels.  During placement of McCullough retractor at T4-5 the spike P; did fracture the superior and posterior aspect of the spinous process of T4 but it was just the very tip of the spinous process of this was excised and removed.  I then drilled down the inferior lamina of T4 medial facet complex superior aspect the lamina T5 laminotomy was begun with a 2 mm Kerrison punch.  Ligament flavum is identified and removed in piecemeal fashion exposing the thecal sac.  This was then packed with Gelfoam tension taken at T10-11.  In a similar fashion subperiosteal dissection carried out bilaterally at T10-11 the spinous process at T10 was removed as well as the superior aspect the spinous process of T11 laminotomy was begun centrally with a 2 mm Kerrison punch marching up the lateral gutters I then remove the lamina of T10 and superior aspect of lamina of T11 there was marked spondylitic compression and hourglass fashion at the T10-11 disc base identified the T11 pedicle and performed a laminectomy which included the vast majority of the T10 lamina to the inferior aspect of the T11 pedicle.  This was then packed with Gelfoam Gelfoam and under microscope illumination first working back at T4-5 the lateral facet joint and pedicle was drilled drilled down the superior aspect the pedicle was drilled down to the disc base disc base was incised and I drilled through the superior medial aspect the pedicle into the disc base and then incised the disc base and cleaned out the disc base from the lateral compartment working up underneath thecal sac there was several large components of disc that were causing thecal sac compression still partially contained with the ligament.  At the end discectomy there is no further stenosis I was easily  able to pass a nerve hook out the distal T4 foramen T5 foramen and both cephalad caudally without resistance.  I then packed this with Gelfoam and then utilizing the microscope went back to the TL  1011 decompressive laminectomy and performed foraminotomies under by the medial facet at T10 and T11 to confirm adequate decompression at that level.  This was performed bilaterally.  Operative microscope was then removed meticulous hemostasis was maintained both incisions were copiously irrigated Gelfoam was ON top of the dura and both incisions were closed with interrupted Vicryl and running 4 subcuticular's.  Dermabond benzoin Steri-Strips and sterile dressings were applied patient recovery in stable condition.  At the end the case all needle counts and sponge counts were correct.

## 2022-04-08 NOTE — Transfer of Care (Signed)
Immediate Anesthesia Transfer of Care Note  Patient: Sara Cross  Procedure(s) Performed: Left - Thoracic five-Thoracic six TRANSPEDICULAR DISKECTOMY AND DECOMPRESSION - bilateral - Thoracic ten-Thoracic eleven DECOMPRESSIVE THORACIC LAMINECTOMY (Left: Back)  Patient Location: PACU  Anesthesia Type:General  Level of Consciousness: awake and alert   Airway & Oxygen Therapy: Patient Spontanous Breathing  Post-op Assessment: Report given to RN and Post -op Vital signs reviewed and stable  Post vital signs: Reviewed and stable  Last Vitals:  Vitals Value Taken Time  BP 132/79 04/08/22 0930  Temp    Pulse 72 04/08/22 0932  Resp 14 04/08/22 0932  SpO2 95 % 04/08/22 0932  Vitals shown include unvalidated device data.  Last Pain:  Vitals:   04/08/22 0600  TempSrc:   PainSc: 0-No pain         Complications: No notable events documented.

## 2022-04-08 NOTE — Anesthesia Postprocedure Evaluation (Signed)
Anesthesia Post Note  Patient: Sara Cross  Procedure(s) Performed: Left - Thoracic five-Thoracic six TRANSPEDICULAR DISKECTOMY AND DECOMPRESSION - bilateral - Thoracic ten-Thoracic eleven DECOMPRESSIVE THORACIC LAMINECTOMY (Left: Back)     Patient location during evaluation: PACU Anesthesia Type: General Level of consciousness: awake and alert Pain management: pain level controlled Vital Signs Assessment: post-procedure vital signs reviewed and stable Respiratory status: spontaneous breathing, nonlabored ventilation and respiratory function stable Cardiovascular status: blood pressure returned to baseline Postop Assessment: no apparent nausea or vomiting Anesthetic complications: no   No notable events documented.  Last Vitals:  Vitals:   04/08/22 1134 04/08/22 1134  BP: (!) 143/87   Pulse: (!) 113 (!) 53  Resp: 17   Temp: 36.4 C   SpO2: 95% (!) 87%    Last Pain:  Vitals:   04/08/22 1134  TempSrc: Oral  PainSc:                  Marthenia Rolling

## 2022-04-08 NOTE — H&P (Addendum)
Sara Cross is an 66 y.o. female.   Chief Complaint: Back and left chest wall pain numbness in her legs HPI: 66 year old female with left T5 radicular symptoms as well as numbness tingling her legs with ambulation.  Work-up revealed thoracic spondylosis at T4-5 with a lateral left-sided disc and T10-11 with cord compression.  Due to her progression of clinical syndrome imaging findings and failed conservative treatment I recommended a left sided transpedicular discectomy at T4-5 as well as a decompressive thoracic laminectomy at T10-11.  I extensively gone over the risks and benefits of the operation with her as well as perioperative course expectations of outcome and alternatives of surgery and she understood and agreed to proceed forward.  Past Medical History:  Diagnosis Date   Anxiety    Arthritis    DDD (degenerative disc disease)    Depression    Diabetes mellitus    cbg fasting 170-200   Dizziness    GERD (gastroesophageal reflux disease)    Headache(784.0)    Heart murmur    as a child   History of bronchitis    History of kidney stones    Hyperlipidemia    "under control"   Hypertension    Morbid obesity ([place])    Numbness and tingling in left arm    Numbness and tingling of both legs    Pneumonia    hx of   Sleep apnea    wear cpap nightly    Past Surgical History:  Procedure Laterality Date   ABDOMINAL HYSTERECTOMY  2004   partial   ANTERIOR CERVICAL DECOMP/DISCECTOMY FUSION  12/23/2011   Procedure: ANTERIOR CERVICAL DECOMPRESSION/DISCECTOMY FUSION 2 LEVELS;  Surgeon: Elaina Hoops, MD;  Location: [facility];  Service: Neurosurgery;  Laterality: N/A;  Anterior Cervical Four-Five/Five-Six Decompression and Fusion   ANTERIOR CERVICAL DECOMP/DISCECTOMY FUSION N/A 06/07/2013   Procedure: ANTERIOR CERVICAL DECOMPRESSION/DISCECTOMY FUSION 1 LEVEL/HARDWARE REMOVAL;  Surgeon: Elaina Hoops, MD;  Location: [facility];  Service: Neurosurgery;  Laterality: N/A;  ANTERIOR CERVICAL  DECOMPRESSION/DISCECTOMY FUSION 1 LEVEL/HARDWARE REMOVAL   APPLICATION OF INTRAOPERATIVE CT SCAN N/A 07/22/2015   Procedure: APPLICATION OF INTRAOPERATIVE CT SCAN;  Surgeon: Kary Kos, MD;  Location: [facility];  Service: Neurosurgery;  Laterality: N/A;   BACK SURGERY     01/2011   BREAST REDUCTION SURGERY Bilateral 04/2015   CARPAL TUNNEL RELEASE  12/23/2011   Procedure: CARPAL TUNNEL RELEASE;  Surgeon: Elaina Hoops, MD;  Location: [facility];  Service: Neurosurgery;  Laterality: Left;  Left Carpal Tunnel Release   CARPAL TUNNEL RELEASE Right 02/16/2017   Procedure: RIGHT CARPAL TUNNEL RELEASE;  Surgeon: Leanora Cover, MD;  Location: [facility];  Service: Orthopedics;  Laterality: Right;   CESEAREAN     X2   COLONOSCOPY     HAND SURGERY     right   2011    FOR INJURY    LUMBAR LAMINECTOMY/DECOMPRESSION MICRODISCECTOMY N/A 08/29/2014   Procedure: Thoracic seven-eight  microdiskectomy;  Surgeon: Elaina Hoops, MD;  Location: [facility];  Service: Neurosurgery;  Laterality: N/A;   toe nail removal     right great toe   TOENAIL EXCISION     BIG TOE RIGHT FOOT    Family History  Problem Relation Age of Onset   Cancer Father        ORAL   Arthritis Mother    Diabetes Sister    Hyperlipidemia Sister    Diabetes Maternal Grandmother  Hypertension Maternal Grandmother    Hyperlipidemia Maternal Grandmother    Heart disease Maternal Grandmother    Cancer Maternal Grandmother        stomach   Hypertension Other    Hyperlipidemia Other    Sleep apnea Other    Obesity Other    Cancer Paternal Grandfather        liver   Anesthesia problems Neg Hx    Hypotension Neg Hx    Malignant hyperthermia Neg Hx    Pseudochol deficiency Neg Hx    Seizures Neg Hx    Social History:  reports that she quit smoking about 7 years ago. Her smoking use included cigarettes. She has a 7.50 pack-year smoking history. She has never used smokeless tobacco. She reports current alcohol use. She  reports that she does not use drugs.  Allergies:  Allergies  Allergen Reactions   Benicar [Olmesartan] Other (See Comments)    Pt does not remember reaction    Ziac [Bisoprolol-Hydrochlorothiazide] Other (See Comments)    Pt does not know of any reaction    Lortab [Hydrocodone-Acetaminophen] Itching    Medications Prior to Admission  Medication Sig Dispense Refill   buPROPion (WELLBUTRIN XL) 150 MG 24 hr tablet Take 300 mg by mouth at bedtime.     Cholecalciferol (VITAMIN D PO) Take 10,000 Units by mouth daily.      furosemide (LASIX) 20 MG tablet TAKE TWO TABLETS BY MOUTH ONCE DAILY (Patient taking differently: Take 20 mg by mouth daily as needed for fluid.) 180 tablet 0   gabapentin (NEURONTIN) 300 MG capsule Take 300 mg by mouth at bedtime.     losartan (COZAAR) 50 MG tablet Take 50 mg by mouth daily.     magnesium oxide (MAG-OX) 400 (240 Mg) MG tablet Take 400 mg by mouth daily.     meloxicam (MOBIC) 15 MG tablet Take 7.5-15 mg by mouth daily as needed.     metFORMIN (GLUCOPHAGE-XR) 500 MG 24 hr tablet TAKE TWO TABLETS BY MOUTH TWICE DAILY (Patient taking differently: Take 2,000 mg by mouth at bedtime.) 360 tablet 0   pantoprazole (PROTONIX) 40 MG tablet Take 40 mg by mouth daily before lunch.     rosuvastatin (CRESTOR) 5 MG tablet Take 5 mg by mouth every evening.     traMADol (ULTRAM) 50 MG tablet Take 50 mg by mouth every 6 (six) hours as needed for pain.      Results for orders placed or performed during the hospital encounter of 04/08/22 (from the past 48 hour(s))  Glucose, capillary     Status: Abnormal   Collection Time: 04/08/22  5:53 AM  Result Value Ref Range   Glucose-Capillary 115 (H) 70 - 99 mg/dL    Comment: Glucose reference range applies only to samples taken after fasting for at least 8 hours.   No results found.  Review of Systems  Musculoskeletal:  Positive for back pain.  Neurological:  Positive for numbness.    Blood pressure (!) 159/70, pulse 73,  temperature 97.9 F (36.6 C), temperature source Oral, resp. rate 18, height '5\' 2"'$  (1.575 m), weight 79.4 kg, SpO2 97 %. Physical Exam HENT:     Head: Normocephalic.     Right Ear: Tympanic membrane normal.     Nose: Nose normal.     Mouth/Throat:     Mouth: Mucous membranes are moist.  Eyes:     Pupils: Pupils are equal, round, and reactive to light.  Cardiovascular:     Rate  and Rhythm: Normal rate.  Pulmonary:     Effort: Pulmonary effort is normal.  Abdominal:     General: Abdomen is flat.  Musculoskeletal:        General: Normal range of motion.  Skin:    General: Skin is warm.  Neurological:     General: No focal deficit present.     Mental Status: She is alert.     Comments: Patient is awake and alert strength is 5 out of 5 iliopsoas, quads, hamstrings, gastrocs, into tibialis, and EHL.      Assessment/Plan 66 year old presents for left T4-5 transpedicular discectomy decompressive laminectomy T10-11.  Elaina Hoops, MD 04/08/2022, 7:14 AM

## 2022-04-08 NOTE — Anesthesia Procedure Notes (Signed)
Procedure Name: Intubation Date/Time: 04/08/2022 7:39 AM  Performed by: Erick Colace, CRNAPre-anesthesia Checklist: Patient identified, Emergency Drugs available, Suction available and Patient being monitored Patient Re-evaluated:Patient Re-evaluated prior to induction Oxygen Delivery Method: Circle system utilized Preoxygenation: Pre-oxygenation with 100% oxygen Induction Type: IV induction Ventilation: Mask ventilation without difficulty Laryngoscope Size: Mac and 4 Grade View: Grade II Tube type: Oral Tube size: 7.0 mm Number of attempts: 1 Airway Equipment and Method: Stylet and Oral airway Placement Confirmation: ETT inserted through vocal cords under direct vision, positive ETCO2 and breath sounds checked- equal and bilateral Secured at: 21 cm Tube secured with: Tape Dental Injury: Teeth and Oropharynx as per pre-operative assessment

## 2022-04-09 ENCOUNTER — Encounter (HOSPITAL_COMMUNITY): Payer: Self-pay | Admitting: Neurosurgery

## 2022-04-09 DIAGNOSIS — M4804 Spinal stenosis, thoracic region: Secondary | ICD-10-CM | POA: Diagnosis not present

## 2022-04-09 LAB — GLUCOSE, CAPILLARY: Glucose-Capillary: 125 mg/dL — ABNORMAL HIGH (ref 70–99)

## 2022-04-09 MED ORDER — CYCLOBENZAPRINE HCL 10 MG PO TABS: 10.0000 mg | ORAL_TABLET | Freq: Three times a day (TID) | ORAL | 0 refills | Status: AC | PRN

## 2022-04-09 MED ORDER — OXYCODONE HCL 10 MG PO TABS: 10.0000 mg | ORAL_TABLET | Freq: Four times a day (QID) | ORAL | 0 refills | Status: AC | PRN

## 2022-04-09 NOTE — Progress Notes (Signed)
PT Cancellation Note  Patient Details Name: Sara Cross MRN: [id number] DOB: Jul 21, 1956   Cancelled Treatment:    Reason Eval/Treat Not Completed: PT screened, no needs identified, will sign off Per OT, all education completed and patient mobilizing well along with stair negotiation. No skilled PT needs required acutely. PT will sign off.   Jackquline Branca A. Gilford Rile PT, DPT Acute Rehabilitation Services Office [phone]    Linna Hoff 04/09/2022, 9:20 AM

## 2022-04-09 NOTE — Progress Notes (Signed)
Patient is discharged from room 3C11 at this time. Alert and in stable condition. IV site d/c'd and instructions read to patient and husband with understanding verbalized and all questions answered. Left unit via wheelchair with all belongings at side.

## 2022-04-09 NOTE — Discharge Summary (Signed)
Physician Discharge Summary  Patient ID: ALAIJAH Cross MRN: [id number] DOB/AGE: February 07, 1956 66 y.o.  Admit date: 04/08/2022 Discharge date: 04/09/2022  Admission Diagnoses:  HNP  Discharge Diagnoses:  Same Principal Problem:   HNP (herniated nucleus pulposus with myelopathy), thoracic   Discharged Condition: Stable  Hospital Course:  Sara Cross is a 66 y.o. female admitted after elective thoracic discectomy. She reports improvement in leg soreness postop. She is ambulating well, tolerating diet, voiding normally, pain controlled with oral medication.  Treatments: Surgery - T4-5, T10-11 lami discectomy  Discharge Exam: Blood pressure (!) 112/58, pulse 79, temperature 98.6 F (37 C), temperature source Oral, resp. rate 16, height '5\' 2"'$  (1.575 m), weight 79.4 kg, SpO2 95 %. Awake, alert, oriented Speech fluent, appropriate CN grossly intact 5/5 BUE/BLE Wound c/d/i  Disposition: Discharge disposition: 01-Home or Self Care       Discharge Instructions     Call MD for:  redness, tenderness, or signs of infection (pain, swelling, redness, odor or green/yellow discharge around incision site)   Complete by: As directed    Call MD for:  temperature >100.4   Complete by: As directed    Diet - low sodium heart healthy   Complete by: As directed    Discharge instructions   Complete by: As directed    Walk at home as much as possible, at least 4 times / day   Increase activity slowly   Complete by: As directed    Lifting restrictions   Complete by: As directed    No lifting > 10 lbs   May shower / Bathe   Complete by: As directed    48 hours after surgery   May walk up steps   Complete by: As directed    Other Restrictions   Complete by: As directed    No bending/twisting at waist   Remove dressing in 24 hours   Complete by: As directed       Allergies as of 04/09/2022       Reactions   Benicar [olmesartan] Other (See Comments)   Pt does not remember reaction     Ziac [bisoprolol-hydrochlorothiazide] Other (See Comments)   Pt does not know of any reaction    Lortab [hydrocodone-acetaminophen] Itching        Medication List     STOP taking these medications    traMADol 50 MG tablet Commonly known as: ULTRAM       TAKE these medications    buPROPion 150 MG 24 hr tablet Commonly known as: WELLBUTRIN XL Take 300 mg by mouth at bedtime.   cyclobenzaprine 10 MG tablet Commonly known as: FLEXERIL Take 1 tablet (10 mg total) by mouth 3 (three) times daily as needed for muscle spasms.   furosemide 20 MG tablet Commonly known as: LASIX TAKE TWO TABLETS BY MOUTH ONCE DAILY What changed:  how much to take when to take this reasons to take this   gabapentin 300 MG capsule Commonly known as: NEURONTIN Take 300 mg by mouth at bedtime.   losartan 50 MG tablet Commonly known as: COZAAR Take 50 mg by mouth daily.   magnesium oxide 400 (240 Mg) MG tablet Commonly known as: MAG-OX Take 400 mg by mouth daily.   meloxicam 15 MG tablet Commonly known as: MOBIC Take 7.5-15 mg by mouth daily as needed.   metFORMIN 500 MG 24 hr tablet Commonly known as: GLUCOPHAGE-XR TAKE TWO TABLETS BY MOUTH TWICE DAILY What changed:  how much to take  when to take this   Oxycodone HCl 10 MG Tabs Take 1 tablet (10 mg total) by mouth every 6 (six) hours as needed for up to 7 days for severe pain ((score 7 to 10)).   pantoprazole 40 MG tablet Commonly known as: PROTONIX Take 40 mg by mouth daily before lunch.   rosuvastatin 5 MG tablet Commonly known as: CRESTOR Take 5 mg by mouth every evening.   VITAMIN D PO Take 10,000 Units by mouth daily.        Follow-up Information     Kary Kos, MD Follow up in 3 week(s).   Specialty: Neurosurgery Contact information: [address] [place] [address] [phone]                 Signed: Jairo Ben 04/09/2022, 8:57 AM

## 2022-04-09 NOTE — Evaluation (Signed)
Occupational Therapy Evaluation Patient Details Name: Sara Cross MRN: [id number] DOB: 01/21/1956 Today's Date: 04/09/2022   History of Present Illness Sara Cross is a 66 y.o. female s/p : Left - Thoracic five-Thoracic six TRANSPEDICULAR DISKECTOMY AND DECOMPRESSION - bilateral - Thoracic ten-Thoracic eleven DECOMPRESSIVE THORACIC LAMINECTOMY 8/25. PMHx: anxiety, DDD, DM, HLD, HTN, multiple back surgeries   Clinical Impression   Sara Cross was evaluated s/p the above back surgery, she is typically indep at baseline including ADL/IADLs and lives in a 1 level home with her husband. Upon evaluation pt was educated on back precautions and compensatory techniques to maintain during mobility and ADL. She required min A for LB ADLs, and had the necessary AE at home to increase indep with tasks. Pt mobilizes with and without RW demonstrated increased safety and stability with Rw. She does not need further acute OT, recommend d/c to home without follow up therapies.      Recommendations for follow up therapy are one component of a multi-disciplinary discharge planning process, led by the attending physician.  Recommendations may be updated based on patient status, additional functional criteria and insurance authorization.   Follow Up Recommendations  No OT follow up    Assistance Recommended at Discharge Intermittent Supervision/Assistance  Patient can return home with the following A little help with walking and/or transfers;A little help with bathing/dressing/bathroom;Assist for transportation;Help with stairs or ramp for entrance;Assistance with cooking/housework    Functional Status Assessment  Patient has had a recent decline in their functional status and demonstrates the ability to make significant improvements in function in a reasonable and predictable amount of time.  Equipment Recommendations  Other (comment) (RW)    Recommendations for Other Services       Precautions / Restrictions  Precautions Precautions: Back;Fall Precaution Booklet Issued: Yes (comment) Precaution Comments: reviewed 3/3 back precautions Restrictions Weight Bearing Restrictions: No      Mobility Bed Mobility Overal bed mobility: Needs Assistance Bed Mobility: Rolling, Sidelying to Sit Rolling: Supervision Sidelying to sit: Supervision       General bed mobility comments: HOB elevatd, pts bed at home is adjustable    Transfers Overall transfer level: Needs assistance Equipment used: Rolling walker (2 wheels) Transfers: Sit to/from Stand Sit to Stand: Supervision                  Balance Overall balance assessment: Mild deficits observed, not formally tested (increased safety with RW)                                         ADL either performed or assessed with clinical judgement   ADL Overall ADL's : Needs assistance/impaired Eating/Feeding: Independent   Grooming: Supervision/safety;Standing   Upper Body Bathing: Supervision/ safety;Sitting   Lower Body Bathing: Minimal assistance;Sit to/from stand   Upper Body Dressing : Supervision/safety;Sitting   Lower Body Dressing: Minimal assistance;Sit to/from stand   Toilet Transfer: Supervision/safety;Rolling walker (2 wheels);Ambulation   Toileting- Clothing Manipulation and Hygiene: Supervision/safety;Sitting/lateral lean       Functional mobility during ADLs: Supervision/safety;Rolling walker (2 wheels) General ADL Comments: min A for LB tasks to maintain back precautions - pt is familar with AE to increase indep, and has reacher, sockaide and toilet tongs at home     Vision Baseline Vision/History: 0 No visual deficits Vision Assessment?: No apparent visual deficits     Perception     Praxis  Pertinent Vitals/Pain Pain Assessment Pain Assessment: Faces Faces Pain Scale: Hurts little more Pain Location: back Pain Descriptors / Indicators: Discomfort Pain Intervention(s): Limited  activity within patient's tolerance, Monitored during session     Hand Dominance Right   Extremity/Trunk Assessment Upper Extremity Assessment Upper Extremity Assessment: Overall WFL for tasks assessed   Lower Extremity Assessment Lower Extremity Assessment: Overall WFL for tasks assessed   Cervical / Trunk Assessment Cervical / Trunk Assessment: Back Surgery   Communication Communication Communication: No difficulties   Cognition Arousal/Alertness: Awake/alert Behavior During Therapy: WFL for tasks assessed/performed Overall Cognitive Status: Within Functional Limits for tasks assessed                                       General Comments  VSS on RA, husband present and supportive    Exercises     Shoulder Instructions      Home Living Family/patient expects to be discharged to:: Private residence Living Arrangements: Spouse/significant other Available Help at Discharge: Family;Available 24 hours/day Type of Home: House Home Access: Stairs to enter [employer] of Steps: 2 Entrance Stairs-Rails: Right;Left Home Layout: One level     Bathroom Shower/Tub: Occupational psychologist: Handicapped height Bathroom Accessibility: Yes How Accessible: Accessible via walker Home Equipment: Standard Walker;Rollator (4 wheels);Cane - single point;BSC/3in1;Shower seat - built in;Grab bars - toilet;Grab bars - tub/shower;Hand held shower head;Adaptive equipment Adaptive Equipment: Reacher;Sock aid        Prior Functioning/Environment Prior Level of Function : Independent/Modified Independent;Driving             Mobility Comments: no AD ADLs Comments: indep with ADL/IADls        OT Problem List: Decreased strength;Decreased range of motion;Decreased activity tolerance;Impaired balance (sitting and/or standing);Decreased knowledge of precautions;Pain      OT Treatment/Interventions:      OT Goals(Current goals can be found in  the care plan section) Acute Rehab OT Goals Patient Stated Goal: home OT Goal Formulation: All assessment and education complete, DC therapy Time For Goal Achievement: 04/09/22 Potential to Achieve Goals: Good  OT Frequency:      Co-evaluation              AM-PAC OT "6 Clicks" Daily Activity     Outcome Measure Help from another person eating meals?: None Help from another person taking care of personal grooming?: A Little Help from another person toileting, which includes using toliet, bedpan, or urinal?: A Little Help from another person bathing (including washing, rinsing, drying)?: A Little Help from another person to put on and taking off regular upper body clothing?: None Help from another person to put on and taking off regular lower body clothing?: A Little 6 Click Score: 20   End of Session Equipment Utilized During Treatment: Rolling walker (2 wheels) Nurse Communication: Mobility status  Activity Tolerance: Patient tolerated treatment well Patient left: in bed;with call bell/phone within reach;with family/visitor present  OT Visit Diagnosis: Unsteadiness on feet (R26.81);Muscle weakness (generalized) (M62.81);Pain                Time: 0258-5277 OT Time Calculation (min): 17 min Charges:  OT General Charges $OT Visit: 1 Visit OT Evaluation $OT Eval Low Complexity: 1 Low    Bo Teicher A Tyhesha Dutson 04/09/2022, 9:42 AM

## 2022-04-09 NOTE — Discharge Instructions (Signed)
Wound Care Keep incision covered and dry for two days.   Do not put any creams, lotions, or ointments on incision. Leave steri-strips on back.  They will fall off by themselves. Activity Walk each and every day, increasing distance each day. No lifting greater than 5 lbs.  Avoid bending, lifting and twisting. No driving for 2 weeks; may ride as a passenger locally. If provided with back brace, wear when out of bed.  It is not necessary to wear brace in bed. Diet Resume your normal diet.  Return to Work Will be discussed at you follow up appointment. Call Your Doctor If Any of These Occur Redness, drainage, or swelling at the wound.  Temperature greater than 101 degrees. Severe pain not relieved by pain medication. Incision starts to come apart. Follow Up Appt Call today for appointment in 1-2 weeks ([phone]) or for problems.  If you have any hardware placed in your spine, you will need an x-ray before your appointment.

## 2022-09-19 ENCOUNTER — Other Ambulatory Visit: Payer: Self-pay | Admitting: Family Medicine

## 2022-09-19 DIAGNOSIS — Z1231 Encounter for screening mammogram for malignant neoplasm of breast: Secondary | ICD-10-CM

## 2022-11-02 ENCOUNTER — Ambulatory Visit
Admission: RE | Admit: 2022-11-02 | Discharge: 2022-11-02 | Disposition: A | Discharge status: Home / Self Care | Payer: Medicare Other | Source: Ambulatory Visit | Attending: Family Medicine | Admitting: Family Medicine

## 2022-11-02 DIAGNOSIS — Z1231 Encounter for screening mammogram for malignant neoplasm of breast: Secondary | ICD-10-CM

## 2023-09-29 ENCOUNTER — Other Ambulatory Visit: Payer: Self-pay | Admitting: Family Medicine

## 2023-09-29 DIAGNOSIS — Z1231 Encounter for screening mammogram for malignant neoplasm of breast: Secondary | ICD-10-CM

## 2023-11-06 ENCOUNTER — Ambulatory Visit: Payer: Medicare Other

## 2023-11-20 ENCOUNTER — Ambulatory Visit

## 2023-12-06 ENCOUNTER — Ambulatory Visit
Admission: RE | Admit: 2023-12-06 | Discharge: 2023-12-06 | Disposition: A | Discharge status: Home / Self Care | Source: Ambulatory Visit | Attending: Family Medicine | Admitting: Family Medicine

## 2023-12-06 DIAGNOSIS — Z1231 Encounter for screening mammogram for malignant neoplasm of breast: Secondary | ICD-10-CM

## 2024-02-06 ENCOUNTER — Other Ambulatory Visit: Payer: Self-pay | Admitting: Family Medicine

## 2024-02-06 DIAGNOSIS — F17200 Nicotine dependence, unspecified, uncomplicated: Secondary | ICD-10-CM

## 2024-02-07 ENCOUNTER — Encounter: Payer: Self-pay | Admitting: Gastroenterology

## 2024-02-08 ENCOUNTER — Encounter: Payer: Self-pay | Admitting: Family Medicine

## 2024-02-09 ENCOUNTER — Encounter: Payer: Self-pay | Admitting: Family Medicine

## 2024-02-12 ENCOUNTER — Ambulatory Visit
Admission: RE | Admit: 2024-02-12 | Discharge: 2024-02-12 | Disposition: A | Discharge status: Home / Self Care | Source: Ambulatory Visit | Attending: Family Medicine | Admitting: Family Medicine

## 2024-02-12 DIAGNOSIS — F17200 Nicotine dependence, unspecified, uncomplicated: Secondary | ICD-10-CM

## 2024-11-06 ENCOUNTER — Ambulatory Visit: Admitting: Diagnostic Neuroimaging
